# Patient Record
Sex: Male | Born: 1965 | Race: White | Hispanic: No | Marital: Married | Smoking: Never smoker
Health system: Southern US, Community
[De-identification: ages and names within clinical notes are randomized; demographics above are authoritative.]

## PROBLEM LIST (undated history)

## (undated) DIAGNOSIS — R519 Headache, unspecified: Secondary | ICD-10-CM

## (undated) DIAGNOSIS — E669 Obesity, unspecified: Secondary | ICD-10-CM

## (undated) DIAGNOSIS — E785 Hyperlipidemia, unspecified: Secondary | ICD-10-CM

## (undated) DIAGNOSIS — B029 Zoster without complications: Secondary | ICD-10-CM

## (undated) DIAGNOSIS — I10 Essential (primary) hypertension: Secondary | ICD-10-CM

## (undated) DIAGNOSIS — R51 Headache: Secondary | ICD-10-CM

## (undated) DIAGNOSIS — E1140 Type 2 diabetes mellitus with diabetic neuropathy, unspecified: Secondary | ICD-10-CM

## (undated) HISTORY — DX: Essential (primary) hypertension: I10

## (undated) HISTORY — DX: Hyperlipidemia, unspecified: E78.5

## (undated) HISTORY — PX: MIDDLE EAR SURGERY: SHX713

## (undated) HISTORY — DX: Type 2 diabetes mellitus with diabetic neuropathy, unspecified: E11.40

## (undated) HISTORY — DX: Obesity, unspecified: E66.9

---

## 2000-08-06 ENCOUNTER — Emergency Department (HOSPITAL_COMMUNITY): Admission: EM | Admit: 2000-08-06 | Discharge: 2000-08-06 | Payer: Self-pay | Admitting: Emergency Medicine

## 2000-08-06 ENCOUNTER — Encounter: Payer: Self-pay | Admitting: Emergency Medicine

## 2000-08-11 ENCOUNTER — Ambulatory Visit (HOSPITAL_COMMUNITY): Admission: RE | Admit: 2000-08-11 | Discharge: 2000-08-11 | Payer: Self-pay | Admitting: Pulmonary Disease

## 2000-09-07 ENCOUNTER — Encounter: Admission: RE | Admit: 2000-09-07 | Discharge: 2000-09-13 | Payer: Self-pay | Admitting: Pulmonary Disease

## 2007-02-05 ENCOUNTER — Ambulatory Visit (HOSPITAL_COMMUNITY): Admission: RE | Admit: 2007-02-05 | Discharge: 2007-02-05 | Payer: Self-pay | Admitting: Unknown Physician Specialty

## 2009-02-14 DIAGNOSIS — E119 Type 2 diabetes mellitus without complications: Secondary | ICD-10-CM

## 2009-02-14 HISTORY — DX: Type 2 diabetes mellitus without complications: E11.9

## 2010-03-11 ENCOUNTER — Encounter (INDEPENDENT_AMBULATORY_CARE_PROVIDER_SITE_OTHER): Payer: Self-pay | Admitting: *Deleted

## 2010-03-11 LAB — CONVERTED CEMR LAB
BUN: 9 mg/dL
CO2: 26 meq/L
Calcium: 9.2 mg/dL
Chloride: 102 meq/L
Creatinine, Ser: 0.79 mg/dL
Glucose, Bld: 111 mg/dL
Potassium: 4.1 meq/L
Sodium: 139 meq/L

## 2010-03-12 ENCOUNTER — Encounter (INDEPENDENT_AMBULATORY_CARE_PROVIDER_SITE_OTHER): Payer: Self-pay | Admitting: *Deleted

## 2010-03-12 LAB — CONVERTED CEMR LAB
ALT: 10 units/L
AST: 15 units/L
Albumin: 4.3 g/dL
Alkaline Phosphatase: 42 units/L
Bilirubin, Direct: 0.2 mg/dL
Cholesterol: 190 mg/dL
HDL: 40 mg/dL
Hgb A1c MFr Bld: 7.2 %
LDL Cholesterol: 124 mg/dL
Total Protein: 6.7 g/dL
Triglycerides: 132 mg/dL

## 2010-03-30 ENCOUNTER — Ambulatory Visit: Payer: Self-pay | Admitting: Cardiology

## 2010-04-09 ENCOUNTER — Encounter (INDEPENDENT_AMBULATORY_CARE_PROVIDER_SITE_OTHER): Payer: Self-pay | Admitting: *Deleted

## 2010-04-09 ENCOUNTER — Ambulatory Visit: Payer: Self-pay | Admitting: Cardiology

## 2010-04-09 ENCOUNTER — Encounter: Payer: Self-pay | Admitting: Cardiology

## 2010-04-09 DIAGNOSIS — E1165 Type 2 diabetes mellitus with hyperglycemia: Secondary | ICD-10-CM | POA: Insufficient documentation

## 2010-04-09 DIAGNOSIS — E669 Obesity, unspecified: Secondary | ICD-10-CM | POA: Insufficient documentation

## 2010-04-09 DIAGNOSIS — E119 Type 2 diabetes mellitus without complications: Secondary | ICD-10-CM | POA: Insufficient documentation

## 2010-04-13 NOTE — Miscellaneous (Signed)
Summary: labs bmp,lipids,a1c,03/12/2010  Clinical Lists Changes  Observations: Added new observation of ALBUMIN: 4.3 g/dL (16/11/9602 5:40) Added new observation of PROTEIN, TOT: 6.7 g/dL (98/12/9145 8:29) Added new observation of SGPT (ALT): 10 units/L (03/12/2010 8:36) Added new observation of SGOT (AST): 15 units/L (03/12/2010 8:36) Added new observation of ALK PHOS: 42 units/L (03/12/2010 8:36) Added new observation of BILI DIRECT: 0.2 mg/dL (56/21/3086 5:78) Added new observation of LDL: 124 mg/dL (46/96/2952 8:41) Added new observation of HDL: 40 mg/dL (32/44/0102 7:25) Added new observation of TRIGLYC TOT: 132 mg/dL (36/64/4034 7:42) Added new observation of CHOLESTEROL: 190 mg/dL (59/56/3875 6:43) Added new observation of HGBA1C: 7.2 % (03/12/2010 8:36) Added new observation of CALCIUM: 9.2 mg/dL (32/95/1884 1:66) Added new observation of CREATININE: 0.79 mg/dL (08/14/1599 0:93) Added new observation of BUN: 9 mg/dL (23/55/7322 0:25) Added new observation of BG RANDOM: 111 mg/dL (42/70/6237 6:28) Added new observation of CO2 PLSM/SER: 26 meq/L (03/11/2010 8:36) Added new observation of CL SERUM: 102 meq/L (03/11/2010 8:36) Added new observation of K SERUM: 4.1 meq/L (03/11/2010 8:36) Added new observation of NA: 139 meq/L (03/11/2010 8:36)

## 2010-04-13 NOTE — Letter (Signed)
Summary: DR Juanetta Gosling RECORDS  [facility]   Imported By: Faythe Ghee 04/09/2010 14:36:03  _____________________________________________________________________  External Attachment:    Type:   Image     Comment:   External Document

## 2010-04-15 ENCOUNTER — Encounter: Payer: Self-pay | Admitting: *Deleted

## 2010-05-04 ENCOUNTER — Ambulatory Visit (INDEPENDENT_AMBULATORY_CARE_PROVIDER_SITE_OTHER): Payer: BC Managed Care – PPO | Admitting: Cardiology

## 2010-05-04 ENCOUNTER — Encounter: Payer: Self-pay | Admitting: Cardiology

## 2010-05-04 VITALS — BP 149/89 | HR 96 | Wt 265.0 lb

## 2010-05-04 DIAGNOSIS — I10 Essential (primary) hypertension: Secondary | ICD-10-CM | POA: Insufficient documentation

## 2010-05-04 DIAGNOSIS — R079 Chest pain, unspecified: Secondary | ICD-10-CM

## 2010-05-04 DIAGNOSIS — E785 Hyperlipidemia, unspecified: Secondary | ICD-10-CM | POA: Insufficient documentation

## 2010-05-04 NOTE — Patient Instructions (Signed)
Follow up same day as stress test Stress Test, Cardiovascular A cardiovascular stress test is done to help determine the presence of coronary artery disease (CAD). A stress test is done while you are walking on a treadmill. The goal of an exercise stress test is to raise your heart rate. This will help determine whether your heart can supply itself with enough blood during increased exercise. People with CAD may experience symptoms such as:  Chest pain.   Shortness of breath.   Arm pain. This usually occurs in the left arm but can occur in the right arm.   Forceful or irregular heart beats (palpitations). This may occur with or without dizziness upon activity.   Unusual sweating at rest or profuse sweating with light activity.   Associated nausea with the above symptoms.  INSTRUCTIONS PRIOR TO YOUR STRESS TEST  You should not eat or drink for 4 hours prior to your stress test or as told by your caregiver.   This test involves walking on a treadmill. Wear loose fitting clothes and comfortable shoes for the test.   Arrive 1 hour before the test or as told by your caregiver.  PROCEDURE OF AN EXERCISE STRESS TEST  Many sticky patches (electrodes) will be put on your chest.   If you have a hairy chest, small areas may have to be shaved to make better contact with the electrodes.   Once the electrodes are attached to your body, wires will be attached to the electrodes, then to an electrocardiogram (ECG) machine.   The ECG machine will record your heart rhythm. Specific heart rhythm changes will be noted on the ECG printout.   While you are walking on the treadmill, your heart and blood pressure will be monitored.   The treadmill will be started at a slow pace. The treadmill speed and incline will gradually be increased to raise your heart rate.   The test can be expected to last between 1 and 2 hours. You will need to be monitored after the test to make sure your heart rate and blood  pressure are okay before you go home.  THE STRESS TEST MAY BE STOPPED IF:  You develop an abnormal heart rate or a very fast heart rate.   You develop chest pain.   You develop high or low blood pressure.   You develop shortness of breath.   You feel dizzy.  WHAT DOES AN ABNORMAL RESULT MEAN AND WHAT HAPPENS NEXT?   An abnormal stress test can indicate significant coronary artery disease (CAD). CAD is defined as narrowing in 1 or more heart (coronary) arteries of more than 70%.   If you have an abnormal stress test, this does not mean your are not getting blood flow to your heart. It only means more testing is needed to understand why your test was abnormal. A common follow up test is a coronary angiogram.  FINDING OUT THE RESULTS OF YOUR TEST  Not all test results are available during your visit. If your test results are not back during the visit, make an appointment with your caregiver to find out the results. Do not assume everything is normal if you have not heard from your caregiver or the medical facility. It is important for you to follow up on all of your test results.  SEEK IMMEDIATE MEDICAL CARE IF: At any time after the stress test you develop:   Chest pain.   Pain radiating down your left arm.   A feeling of  being sick to your stomach (nausea).   Vomiting.   Fainting or shortness of breath.  MAKE SURE YOU:   Understand these instructions.   Will watch your condition.   Will get help right away if you are not doing well or get worse.  Document Released: 01/29/2000 Document Re-Released: 11/28/2008 [facility] Patient Information 2011 [place], [place].

## 2010-05-04 NOTE — Progress Notes (Signed)
HPI:  [name] is seen at the kind request of Dr. Kari Baars for multiple cardiovascular risk factors with a family history of premature coronary disease.  Karl Atkins has enjoyed generally good health, but has had mild diabetes and hypertension that have been well-controlled over the past few years.  He also has treated hyperlipidemia, but specific laboratory values are not currently available.  Prior records have been requested with available prior records reviewed.  Patient denies all cardiopulmonary symptoms despite maintaining a high level of activity including riding a mountain bicycle.  He does considerable lifting in his work without difficulty.  He has never previously been seen by cardiologist or undergone any significant cardiac testing.  Current Outpatient Prescriptions on File Prior to Visit  Medication Sig Dispense Refill  . olmesartan (BENICAR) 20 MG tablet Take 20 mg by mouth daily.        TriCor 145 mg q.d. Glyburide-metformin 5/500 mg q.d. Rosuvastatin 40 mg q.d.  No Known Allergies    Past Medical History  Diagnosis Date  . Hypertension   . Diabetes mellitus   . Obesity   . Hyperlipidemia      Past Surgical History  Procedure Date  . Middle ear surgery     x2     Family history:  Father required CABG surgery at age 52; Mother is deceased with a history of CHF, chronic obstructive pulmonary disease and heart valve replacement surgery.  3 siblings are alive and well.   History   Social History  . Marital Status: Married    Spouse Name: N/A    Number of Children: N/A  . Years of Education: N/A   Occupational History  . Funeral Director    Social History Main Topics  . Smoking status: Never Smoker   . Smokeless tobacco: Not on file  . Alcohol Use: 0.0 oz/week  . Drug Use: Not on file  . Sexually Active: Not on file   Other Topics Concern  . Not on file   Social History Narrative  . No narrative on file     ROS:        Requires corrective  lenses; partial upper and lower dentures.  All other systems reviewed and are negative.  PHYSICAL EXAM: BP 149/89  Pulse 96  Wt 265 lb (120.203 kg)  SpO2 96%  General-Well developed; no acute distress Body habitus-Mildly obese Neck-No JVD; no carotid bruits Lungs-clear lung fields; resonant to percussion Cardiovascular-normal PMI; normal S1 and S2 Abdomen-normal bowel sounds; soft and non-tender without masses or organomegaly Musculoskeletal-No deformities, no cyanosis or clubbing Neurologic-Normal cranial nerves; symmetric strength and tone Skin-Warm, no significant lesions Extremities-distal pulses intact; no edema  EKG:   Normal sinus rhythm; left atrial abnormality; right ventricular conduction delay; otherwise normal; no previous tracing for comparison  ASSESSMENT AND PLAN:

## 2010-06-01 ENCOUNTER — Other Ambulatory Visit: Payer: Self-pay | Admitting: Cardiology

## 2010-06-03 ENCOUNTER — Encounter (HOSPITAL_COMMUNITY): Payer: BC Managed Care – PPO

## 2010-06-03 ENCOUNTER — Ambulatory Visit (INDEPENDENT_AMBULATORY_CARE_PROVIDER_SITE_OTHER): Payer: BC Managed Care – PPO | Admitting: *Deleted

## 2010-06-03 ENCOUNTER — Encounter (HOSPITAL_COMMUNITY): Payer: Self-pay

## 2010-06-03 ENCOUNTER — Encounter (HOSPITAL_COMMUNITY)
Admit: 2010-06-03 | Discharge: 2010-06-03 | Disposition: A | Discharge status: Home / Self Care | Payer: BC Managed Care – PPO | Attending: Cardiology | Admitting: Cardiology

## 2010-06-03 DIAGNOSIS — R079 Chest pain, unspecified: Secondary | ICD-10-CM

## 2010-06-03 MED ORDER — TECHNETIUM TC 99M TETROFOSMIN IV KIT
30.0000 | PACK | Freq: Once | INTRAVENOUS | Status: AC | PRN
  Administered 2010-06-03: 29 via INTRAVENOUS

## 2010-06-03 MED ORDER — TECHNETIUM TC 99M TETROFOSMIN IV KIT
10.0000 | PACK | Freq: Once | INTRAVENOUS | Status: AC | PRN
  Administered 2010-06-03: 9.3 via INTRAVENOUS

## 2010-06-03 NOTE — Progress Notes (Signed)
Please See Full Report Of Nuclear Stress Test.

## 2011-12-09 ENCOUNTER — Encounter (HOSPITAL_COMMUNITY): Payer: Self-pay | Admitting: *Deleted

## 2011-12-09 ENCOUNTER — Emergency Department (HOSPITAL_COMMUNITY)
Admission: EM | Admit: 2011-12-09 | Discharge: 2011-12-09 | Disposition: A | Discharge status: Home / Self Care | Payer: BC Managed Care – PPO | Source: Home / Self Care

## 2011-12-09 DIAGNOSIS — M62830 Muscle spasm of back: Secondary | ICD-10-CM

## 2011-12-09 DIAGNOSIS — S39012A Strain of muscle, fascia and tendon of lower back, initial encounter: Secondary | ICD-10-CM

## 2011-12-09 DIAGNOSIS — M5380 Other specified dorsopathies, site unspecified: Secondary | ICD-10-CM

## 2011-12-09 MED ORDER — HYDROCODONE-ACETAMINOPHEN 5-325 MG PO TABS: ORAL_TABLET | ORAL | Status: DC

## 2011-12-09 MED ORDER — KETOROLAC TROMETHAMINE 60 MG/2ML IM SOLN
INTRAMUSCULAR | Status: AC
  Filled 2011-12-09: qty 2

## 2011-12-09 MED ORDER — NAPROXEN 500 MG PO TABS: 500.0000 mg | ORAL_TABLET | Freq: Two times a day (BID) | ORAL | Status: DC

## 2011-12-09 MED ORDER — TRIAMCINOLONE ACETONIDE 40 MG/ML IJ SUSP
40.0000 mg | Freq: Once | INTRAMUSCULAR | Status: AC
  Administered 2011-12-09: 40 mg via INTRAMUSCULAR

## 2011-12-09 MED ORDER — TRIAMCINOLONE ACETONIDE 40 MG/ML IJ SUSP
INTRAMUSCULAR | Status: AC
  Filled 2011-12-09: qty 5

## 2011-12-09 MED ORDER — KETOROLAC TROMETHAMINE 60 MG/2ML IM SOLN
60.0000 mg | Freq: Once | INTRAMUSCULAR | Status: AC
  Administered 2011-12-09: 60 mg via INTRAMUSCULAR

## 2011-12-09 MED ORDER — CYCLOBENZAPRINE HCL 5 MG PO TABS: 5.0000 mg | ORAL_TABLET | Freq: Three times a day (TID) | ORAL | Status: DC | PRN

## 2011-12-09 NOTE — ED Notes (Signed)
Missed 1 step and did not fall on Wed. evening.  Landed on R foot then L foot. Pain in R posterior hip and radiates down R leg. Had 2 adjustments by the chiropractor today.  Could not sit at the table today.

## 2011-12-09 NOTE — ED Provider Notes (Signed)
History     CSN: [id number]  Arrival date & time 12/09/11  1938   None     Chief Complaint  Patient presents with  . Hip Pain    (Consider location/radiation/quality/duration/timing/severity/associated sxs/prior treatment) HPI Comments: 46 year old male was descending a stair this the first step and stepped down hard first with one leg in the other. He did not notice any particular injury at the time however the next day he started developing pain in his right low back and hip. Yesterday the pain was significantly worse in the right low back and buttock. He saw his chiropractor today who performed alignment and also attached to 10 units to his right low back. He still having significant pain it was suggested by the chiropractor that he get medications, especially steroids for his back pain. She denies neurologic deficits or problems with sensation or motor. The pain is worse with sitting.   Past Medical History  Diagnosis Date  . Hypertension   . Diabetes mellitus   . Obesity   . Hyperlipidemia     Past Surgical History  Procedure Date  . Middle ear surgery     x2    Family History  Problem Relation Age of Onset  . Diabetes Father   . Heart failure Father   . Diabetes Mother   . Heart failure Mother   . Hypertension Mother     History  Substance Use Topics  . Smoking status: Never Smoker   . Smokeless tobacco: Not on file  . Alcohol Use: 0.0 oz/week     rarely      Review of Systems  Constitutional: Negative.   Respiratory: Negative.   Gastrointestinal: Negative.   Genitourinary: Negative.   Musculoskeletal: Positive for myalgias and back pain. Negative for joint swelling.       As per HPI  Skin: Negative.   Neurological: Negative for dizziness, weakness, numbness and headaches.    Allergies  Codeine and Pollen extract-tree extract  Home Medications   Current Outpatient Rx  Name Route Sig Dispense Refill  . CYCLOBENZAPRINE HCL 5 MG PO TABS Oral  Take 1 tablet (5 mg total) by mouth 3 (three) times daily as needed for muscle spasms. 30 tablet 0  . FENOFIBRATE 145 MG PO TABS Oral Take 145 mg by mouth daily.      . GLYBURIDE-METFORMIN 5-500 MG PO TABS Oral Take 1 tablet by mouth daily with breakfast.      . HYDROCODONE-ACETAMINOPHEN 5-325 MG PO TABS  Take one or two tabs q 4 hours as needed for pain 20 tablet 0  . NAPROXEN 500 MG PO TABS Oral Take 1 tablet (500 mg total) by mouth 2 (two) times daily. 20 tablet 0  . OLMESARTAN MEDOXOMIL 20 MG PO TABS Oral Take 20 mg by mouth daily.      Marland Kitchen ROSUVASTATIN CALCIUM 40 MG PO TABS Oral Take 40 mg by mouth daily.        BP 147/90  Pulse 88  Temp 98.7 F (37.1 C) (Oral)  Resp 16  SpO2 99%  Physical Exam  Constitutional: He is oriented to person, place, and time. He appears well-developed and well-nourished.  HENT:  Head: Normocephalic and atraumatic.  Eyes: EOM are normal. Left eye exhibits no discharge.  Neck: Normal range of motion. Neck supple.  Pulmonary/Chest: Effort normal.  Musculoskeletal: He exhibits tenderness. He exhibits no edema.       Tenderness in the musculature of the right low back in the paralumbar  musculature. A "ball" of muscle spasm is palpable in this area. All the pain radiates into the buttock there is no tenderness there. He is also experiencing pain in the posterior thigh and calf. There is minor tenderness in these areas. Straight leg raise does not produce pain in the back discomfort in the calf and hamstring muscles. Distal neurovascular motor sensory is grossly intact.  Neurological: He is alert and oriented to person, place, and time. No cranial nerve deficit.  Skin: Skin is warm and dry. No rash noted. No erythema.  Psychiatric: He has a normal mood and affect.    ED Course  Procedures (including critical care time)  Labs Reviewed - No data to display No results found.   1. Lumbosacral strain   2. Muscle spasm of back       MDM  Kenalog 40 mg IM  now Toradol 60 mg IM now Rx for Norco 5 mg 1-2 tabs every 4 hours when necessary pain Naprosyn 500 mg twice a day with food when necessary pain Flexeril 5 mg one 3 times a day when necessary muscle spasm Apply heat to areas of muscle pain and spasm        Hayden Rasmussen, NP 12/09/11 2101

## 2011-12-10 NOTE — ED Provider Notes (Signed)
Medical screening examination/treatment/procedure(s) were performed by non-physician practitioner and as supervising physician I was immediately available for consultation/collaboration.  Raynald Blend, MD 12/10/11 [phone]

## 2011-12-12 ENCOUNTER — Other Ambulatory Visit (HOSPITAL_COMMUNITY): Payer: Self-pay | Admitting: Preventative Medicine

## 2011-12-12 DIAGNOSIS — T1490XA Injury, unspecified, initial encounter: Secondary | ICD-10-CM

## 2011-12-13 ENCOUNTER — Ambulatory Visit (HOSPITAL_COMMUNITY)
Admission: RE | Admit: 2011-12-13 | Discharge: 2011-12-13 | Disposition: A | Discharge status: Home / Self Care | Payer: BC Managed Care – PPO | Source: Ambulatory Visit | Attending: Preventative Medicine | Admitting: Preventative Medicine

## 2011-12-13 DIAGNOSIS — S39840A Fracture of corpus cavernosum penis, initial encounter: Secondary | ICD-10-CM | POA: Insufficient documentation

## 2011-12-13 DIAGNOSIS — M5126 Other intervertebral disc displacement, lumbar region: Secondary | ICD-10-CM | POA: Insufficient documentation

## 2011-12-13 DIAGNOSIS — M545 Low back pain, unspecified: Secondary | ICD-10-CM | POA: Insufficient documentation

## 2011-12-13 DIAGNOSIS — M51379 Other intervertebral disc degeneration, lumbosacral region without mention of lumbar back pain or lower extremity pain: Secondary | ICD-10-CM | POA: Insufficient documentation

## 2011-12-13 DIAGNOSIS — W108XXA Fall (on) (from) other stairs and steps, initial encounter: Secondary | ICD-10-CM | POA: Insufficient documentation

## 2011-12-13 DIAGNOSIS — M5137 Other intervertebral disc degeneration, lumbosacral region: Secondary | ICD-10-CM | POA: Insufficient documentation

## 2011-12-13 DIAGNOSIS — T1490XA Injury, unspecified, initial encounter: Secondary | ICD-10-CM

## 2012-02-15 HISTORY — PX: BACK SURGERY: SHX140

## 2012-03-01 ENCOUNTER — Other Ambulatory Visit (HOSPITAL_COMMUNITY): Payer: Self-pay | Admitting: Neurosurgery

## 2012-03-01 DIAGNOSIS — M5126 Other intervertebral disc displacement, lumbar region: Secondary | ICD-10-CM

## 2012-03-05 ENCOUNTER — Ambulatory Visit
Admission: RE | Admit: 2012-03-05 | Discharge: 2012-03-05 | Disposition: A | Discharge status: Home / Self Care | Payer: BC Managed Care – PPO | Source: Ambulatory Visit | Attending: Neurosurgery | Admitting: Neurosurgery

## 2012-03-05 DIAGNOSIS — M5126 Other intervertebral disc displacement, lumbar region: Secondary | ICD-10-CM

## 2012-03-06 ENCOUNTER — Other Ambulatory Visit (HOSPITAL_COMMUNITY): Payer: BC Managed Care – PPO

## 2015-08-06 DIAGNOSIS — T148 Other injury of unspecified body region: Secondary | ICD-10-CM | POA: Diagnosis not present

## 2015-11-17 DIAGNOSIS — S8410XA Injury of peroneal nerve at lower leg level, unspecified leg, initial encounter: Secondary | ICD-10-CM | POA: Diagnosis not present

## 2015-11-17 DIAGNOSIS — M5126 Other intervertebral disc displacement, lumbar region: Secondary | ICD-10-CM | POA: Diagnosis not present

## 2015-12-30 DIAGNOSIS — R109 Unspecified abdominal pain: Secondary | ICD-10-CM | POA: Diagnosis not present

## 2015-12-30 DIAGNOSIS — E785 Hyperlipidemia, unspecified: Secondary | ICD-10-CM | POA: Diagnosis not present

## 2015-12-30 DIAGNOSIS — I10 Essential (primary) hypertension: Secondary | ICD-10-CM | POA: Diagnosis not present

## 2015-12-30 DIAGNOSIS — E119 Type 2 diabetes mellitus without complications: Secondary | ICD-10-CM | POA: Diagnosis not present

## 2015-12-31 DIAGNOSIS — E785 Hyperlipidemia, unspecified: Secondary | ICD-10-CM | POA: Diagnosis not present

## 2015-12-31 DIAGNOSIS — E119 Type 2 diabetes mellitus without complications: Secondary | ICD-10-CM | POA: Diagnosis not present

## 2015-12-31 DIAGNOSIS — M21371 Foot drop, right foot: Secondary | ICD-10-CM | POA: Diagnosis not present

## 2015-12-31 DIAGNOSIS — I10 Essential (primary) hypertension: Secondary | ICD-10-CM | POA: Diagnosis not present

## 2016-01-01 ENCOUNTER — Other Ambulatory Visit (HOSPITAL_COMMUNITY): Payer: Self-pay | Admitting: Pulmonary Disease

## 2016-01-01 DIAGNOSIS — R109 Unspecified abdominal pain: Secondary | ICD-10-CM

## 2016-01-04 DIAGNOSIS — S8410XA Injury of peroneal nerve at lower leg level, unspecified leg, initial encounter: Secondary | ICD-10-CM | POA: Diagnosis not present

## 2016-01-04 DIAGNOSIS — M5126 Other intervertebral disc displacement, lumbar region: Secondary | ICD-10-CM | POA: Diagnosis not present

## 2016-01-08 ENCOUNTER — Ambulatory Visit (HOSPITAL_COMMUNITY): Admission: RE | Admit: 2016-01-08 | Payer: BLUE CROSS/BLUE SHIELD | Source: Ambulatory Visit

## 2016-01-08 ENCOUNTER — Encounter (HOSPITAL_COMMUNITY): Payer: Self-pay

## 2016-01-08 ENCOUNTER — Ambulatory Visit (HOSPITAL_COMMUNITY): Payer: Self-pay

## 2016-01-13 ENCOUNTER — Ambulatory Visit (HOSPITAL_COMMUNITY)
Admission: RE | Admit: 2016-01-13 | Discharge: 2016-01-13 | Disposition: A | Discharge status: Home / Self Care | Payer: BLUE CROSS/BLUE SHIELD | Source: Ambulatory Visit | Attending: Pulmonary Disease | Admitting: Pulmonary Disease

## 2016-01-13 ENCOUNTER — Encounter (HOSPITAL_COMMUNITY): Payer: Self-pay

## 2016-01-13 DIAGNOSIS — R634 Abnormal weight loss: Secondary | ICD-10-CM | POA: Diagnosis not present

## 2016-01-13 DIAGNOSIS — I700 Atherosclerosis of aorta: Secondary | ICD-10-CM | POA: Insufficient documentation

## 2016-01-13 DIAGNOSIS — R1032 Left lower quadrant pain: Secondary | ICD-10-CM | POA: Insufficient documentation

## 2016-01-13 DIAGNOSIS — K5790 Diverticulosis of intestine, part unspecified, without perforation or abscess without bleeding: Secondary | ICD-10-CM | POA: Diagnosis not present

## 2016-01-13 DIAGNOSIS — R109 Unspecified abdominal pain: Secondary | ICD-10-CM

## 2016-01-13 DIAGNOSIS — K5730 Diverticulosis of large intestine without perforation or abscess without bleeding: Secondary | ICD-10-CM | POA: Diagnosis not present

## 2016-01-13 LAB — POCT I-STAT CREATININE: CREATININE: 1 mg/dL (ref 0.61–1.24)

## 2016-01-13 MED ORDER — IOPAMIDOL (ISOVUE-300) INJECTION 61%
100.0000 mL | Freq: Once | INTRAVENOUS | Status: AC | PRN
  Administered 2016-01-13: 100 mL via INTRAVENOUS

## 2016-01-15 ENCOUNTER — Other Ambulatory Visit: Payer: Self-pay | Admitting: Neurosurgery

## 2016-01-15 DIAGNOSIS — M5126 Other intervertebral disc displacement, lumbar region: Secondary | ICD-10-CM

## 2016-01-18 ENCOUNTER — Ambulatory Visit (HOSPITAL_COMMUNITY): Payer: BLUE CROSS/BLUE SHIELD

## 2016-01-29 ENCOUNTER — Other Ambulatory Visit: Payer: Self-pay | Admitting: Neurosurgery

## 2016-01-29 DIAGNOSIS — M542 Cervicalgia: Secondary | ICD-10-CM

## 2016-01-31 ENCOUNTER — Ambulatory Visit
Admission: RE | Admit: 2016-01-31 | Discharge: 2016-01-31 | Disposition: A | Discharge status: Home / Self Care | Payer: BLUE CROSS/BLUE SHIELD | Source: Ambulatory Visit | Attending: Neurosurgery | Admitting: Neurosurgery

## 2016-01-31 ENCOUNTER — Inpatient Hospital Stay: Admission: RE | Admit: 2016-01-31 | Payer: BLUE CROSS/BLUE SHIELD | Source: Ambulatory Visit

## 2016-01-31 ENCOUNTER — Other Ambulatory Visit: Payer: BLUE CROSS/BLUE SHIELD

## 2016-01-31 DIAGNOSIS — M4802 Spinal stenosis, cervical region: Secondary | ICD-10-CM | POA: Diagnosis not present

## 2016-01-31 DIAGNOSIS — M542 Cervicalgia: Secondary | ICD-10-CM

## 2016-01-31 DIAGNOSIS — M5126 Other intervertebral disc displacement, lumbar region: Secondary | ICD-10-CM

## 2016-02-04 DIAGNOSIS — I10 Essential (primary) hypertension: Secondary | ICD-10-CM | POA: Diagnosis not present

## 2016-02-04 DIAGNOSIS — Z0000 Encounter for general adult medical examination without abnormal findings: Secondary | ICD-10-CM | POA: Diagnosis not present

## 2016-02-04 DIAGNOSIS — E785 Hyperlipidemia, unspecified: Secondary | ICD-10-CM | POA: Diagnosis not present

## 2016-02-04 DIAGNOSIS — E119 Type 2 diabetes mellitus without complications: Secondary | ICD-10-CM | POA: Diagnosis not present

## 2016-02-23 DIAGNOSIS — S8410XA Injury of peroneal nerve at lower leg level, unspecified leg, initial encounter: Secondary | ICD-10-CM | POA: Diagnosis not present

## 2016-02-23 DIAGNOSIS — M5126 Other intervertebral disc displacement, lumbar region: Secondary | ICD-10-CM | POA: Diagnosis not present

## 2016-03-16 DIAGNOSIS — N529 Male erectile dysfunction, unspecified: Secondary | ICD-10-CM | POA: Diagnosis not present

## 2016-03-16 DIAGNOSIS — E291 Testicular hypofunction: Secondary | ICD-10-CM | POA: Diagnosis not present

## 2016-03-17 ENCOUNTER — Other Ambulatory Visit: Payer: Self-pay | Admitting: Gastroenterology

## 2016-04-18 ENCOUNTER — Encounter (HOSPITAL_COMMUNITY): Payer: Self-pay | Admitting: *Deleted

## 2016-04-19 ENCOUNTER — Ambulatory Visit (HOSPITAL_COMMUNITY): Payer: BLUE CROSS/BLUE SHIELD | Admitting: Certified Registered"

## 2016-04-19 ENCOUNTER — Encounter (HOSPITAL_COMMUNITY): Payer: Self-pay | Admitting: Certified Registered"

## 2016-04-19 ENCOUNTER — Encounter (HOSPITAL_COMMUNITY)
Admission: RE | Disposition: A | Discharge status: Home / Self Care | Payer: Self-pay | Source: Ambulatory Visit | Attending: Gastroenterology

## 2016-04-19 ENCOUNTER — Ambulatory Visit (HOSPITAL_COMMUNITY)
Admission: RE | Admit: 2016-04-19 | Discharge: 2016-04-19 | Disposition: A | Discharge status: Home / Self Care | Payer: BLUE CROSS/BLUE SHIELD | Source: Ambulatory Visit | Attending: Gastroenterology | Admitting: Gastroenterology

## 2016-04-19 DIAGNOSIS — D122 Benign neoplasm of ascending colon: Secondary | ICD-10-CM | POA: Diagnosis not present

## 2016-04-19 DIAGNOSIS — Z7984 Long term (current) use of oral hypoglycemic drugs: Secondary | ICD-10-CM | POA: Diagnosis not present

## 2016-04-19 DIAGNOSIS — E785 Hyperlipidemia, unspecified: Secondary | ICD-10-CM | POA: Insufficient documentation

## 2016-04-19 DIAGNOSIS — E119 Type 2 diabetes mellitus without complications: Secondary | ICD-10-CM | POA: Insufficient documentation

## 2016-04-19 DIAGNOSIS — Z1211 Encounter for screening for malignant neoplasm of colon: Secondary | ICD-10-CM | POA: Diagnosis not present

## 2016-04-19 DIAGNOSIS — I10 Essential (primary) hypertension: Secondary | ICD-10-CM | POA: Diagnosis not present

## 2016-04-19 DIAGNOSIS — D124 Benign neoplasm of descending colon: Secondary | ICD-10-CM | POA: Insufficient documentation

## 2016-04-19 DIAGNOSIS — E7800 Pure hypercholesterolemia, unspecified: Secondary | ICD-10-CM | POA: Insufficient documentation

## 2016-04-19 HISTORY — PX: COLONOSCOPY WITH PROPOFOL: SHX5780

## 2016-04-19 HISTORY — DX: Headache: R51

## 2016-04-19 HISTORY — DX: Headache, unspecified: R51.9

## 2016-04-19 LAB — GLUCOSE, CAPILLARY: Glucose-Capillary: 167 mg/dL — ABNORMAL HIGH (ref 65–99)

## 2016-04-19 SURGERY — COLONOSCOPY WITH PROPOFOL
Anesthesia: Monitor Anesthesia Care

## 2016-04-19 MED ORDER — LIDOCAINE 2% (20 MG/ML) 5 ML SYRINGE
INTRAMUSCULAR | Status: DC | PRN
Start: 2016-04-19 — End: 2016-04-19

## 2016-04-19 MED ORDER — LIDOCAINE 2% (20 MG/ML) 5 ML SYRINGE
INTRAMUSCULAR | Status: DC | PRN
  Administered 2016-04-19: 40 mg via INTRAVENOUS

## 2016-04-19 MED ORDER — SODIUM CHLORIDE 0.9 % IV SOLN: INTRAVENOUS | Status: DC

## 2016-04-19 MED ORDER — PROPOFOL 10 MG/ML IV BOLUS
INTRAVENOUS | Status: AC
  Filled 2016-04-19: qty 40

## 2016-04-19 MED ORDER — PROPOFOL 10 MG/ML IV BOLUS
INTRAVENOUS | Status: AC
  Filled 2016-04-19: qty 20

## 2016-04-19 MED ORDER — PROPOFOL 10 MG/ML IV BOLUS
INTRAVENOUS | Status: DC | PRN
  Administered 2016-04-19 (×8): 40 mg via INTRAVENOUS

## 2016-04-19 MED ORDER — LACTATED RINGERS IV SOLN
INTRAVENOUS | Status: DC
  Administered 2016-04-19: 1000 mL via INTRAVENOUS
  Administered 2016-04-19: 11:00:00 via INTRAVENOUS

## 2016-04-19 MED ORDER — ONDANSETRON HCL 4 MG/2ML IJ SOLN
INTRAMUSCULAR | Status: DC | PRN
  Administered 2016-04-19: 4 mg via INTRAVENOUS

## 2016-04-19 SURGICAL SUPPLY — 21 items

## 2016-04-19 NOTE — Discharge Instructions (Signed)

## 2016-04-19 NOTE — Anesthesia Postprocedure Evaluation (Signed)
Anesthesia Post Note  Patient: Karl Atkins  Procedure(s) Performed: Procedure(s) (LRB): COLONOSCOPY WITH PROPOFOL (N/A)  Patient location during evaluation: PACU Anesthesia Type: MAC Level of consciousness: awake and alert and oriented Pain management: pain level controlled Vital Signs Assessment: post-procedure vital signs reviewed and stable Respiratory status: spontaneous breathing, nonlabored ventilation and respiratory function stable Cardiovascular status: stable and blood pressure returned to baseline Postop Assessment: no signs of nausea or vomiting Anesthetic complications: no       Last Vitals:  Vitals:   04/19/16 1045 04/19/16 1144  BP: (!) 175/105 131/63  Pulse:  83  Resp: 12 17  Temp: 36.7 C 36.4 C    Last Pain:  Vitals:   04/19/16 1144  TempSrc: Oral                 Gazelle Towe A.

## 2016-04-19 NOTE — H&P (Signed)
Procedure: Baseline screening colonoscopy  History: The patient is a 51 year old male born October 21, 1965. He is scheduled to undergo his first screening colonoscopy with polypectomy to prevent colon cancer.  Past medical history: Back surgery. Hypertension. Hypercholesterolemia. Type 2 diabetes mellitus.  Medication allergies: None  Exam: The patient is alert and lying comfortably on the endoscopy stretcher. Abdomen is soft and nontender to palpation. Lungs are clear to auscultation. Cardiac exam reveals a regular rhythm.  Plan: Proceed with screening colonoscopy

## 2016-04-19 NOTE — Transfer of Care (Signed)
Immediate Anesthesia Transfer of Care Note  Patient: Karl Atkins  Procedure(s) Performed: Procedure(s): COLONOSCOPY WITH PROPOFOL (N/A)  Patient Location: PACU and Endoscopy Unit  Anesthesia Type:MAC  Level of Consciousness: sedated  Airway & Oxygen Therapy: Patient Spontanous Breathing and Patient connected to face mask oxygen  Post-op Assessment: Report given to RN and Post -op Vital signs reviewed and stable  Post vital signs: Reviewed  Last Vitals:  Vitals:   04/19/16 1045 04/19/16 1144  BP: (!) 175/105 131/63  Pulse:  83  Resp: 12 17  Temp: 36.7 C 36.4 C    Last Pain:  Vitals:   04/19/16 1144  TempSrc: Oral         Complications: No apparent anesthesia complications

## 2016-04-19 NOTE — Anesthesia Preprocedure Evaluation (Addendum)
Anesthesia Evaluation  Patient identified by MRN, date of birth, ID band Patient awake    Reviewed: Allergy & Precautions, NPO status , Patient's Chart, lab work & pertinent test results  Airway Mallampati: II  TM Distance: >3 FB Neck ROM: Full    Dental  (+) Edentulous Upper, Edentulous Lower, Dental Advisory Given   Pulmonary neg pulmonary ROS,    Pulmonary exam normal breath sounds clear to auscultation       Cardiovascular hypertension, Pt. on medications Normal cardiovascular exam Rhythm:Regular Rate:Normal     Neuro/Psych  Headaches, negative psych ROS   GI/Hepatic Neg liver ROS, Baseline screening colonoscopy   Endo/Other  diabetes, Well Controlled, Type 2, Oral Hypoglycemic AgentsObesity Hyperlipidemia  Renal/GU negative Renal ROS  negative genitourinary   Musculoskeletal negative musculoskeletal ROS (+)   Abdominal (+) + obese,   Peds  Hematology negative hematology ROS (+)   Anesthesia Other Findings   Reproductive/Obstetrics                            Anesthesia Physical Anesthesia Plan  ASA: II  Anesthesia Plan: MAC   Post-op Pain Management:    Induction:   Airway Management Planned: Natural Airway, Nasal Cannula and Simple Face Mask  Additional Equipment:   Intra-op Plan:   Post-operative Plan:   Informed Consent: I have reviewed the patients History and Physical, chart, labs and discussed the procedure including the risks, benefits and alternatives for the proposed anesthesia with the patient or authorized representative who has indicated his/her understanding and acceptance.     Plan Discussed with: Anesthesiologist, CRNA and Surgeon  Anesthesia Plan Comments:         Anesthesia Quick Evaluation

## 2016-04-19 NOTE — Op Note (Signed)
[facility] Patient Name: Karl Atkins Procedure Date: 04/19/2016 MRN: [id number] Attending MD: Garlan Fair , MD Date of Birth: 02-May-1965 CSN: [id number] Age: 51 Admit Type: Outpatient Procedure:                Colonoscopy Indications:              Screening for colorectal malignant neoplasm Providers:                Garlan Fair, MD, Hilma Favors, RN, Alfonso Patten, Technician, Glenis Smoker, CRNA Referring MD:              Medicines:                Propofol per Anesthesia Complications:            No immediate complications. Estimated Blood Loss:     Estimated blood loss was minimal. Procedure:                Pre-Anesthesia Assessment:                           - Prior to the procedure, a History and Physical                            was performed, and patient medications and                            allergies were reviewed. The patient's tolerance of                            previous anesthesia was also reviewed. The risks                            and benefits of the procedure and the sedation                            options and risks were discussed with the patient.                            All questions were answered, and informed consent                            was obtained. Prior Anticoagulants: The patient has                            taken no previous anticoagulant or antiplatelet                            agents. ASA Grade Assessment: II - A patient with                            mild systemic disease. After reviewing the risks  and benefits, the patient was deemed in                            satisfactory condition to undergo the procedure.                           After obtaining informed consent, the colonoscope                            was passed under direct vision. Throughout the                            procedure, the patient's blood pressure, pulse, and                  oxygen saturations were monitored continuously. The                            [id number] scope was introduced through                            the anus and advanced to the the cecum, identified                            by appendiceal orifice and ileocecal valve. The                            colonoscopy was performed without difficulty. The                            patient tolerated the procedure well. The quality                            of the bowel preparation was good. The appendiceal                            orifice and the rectum were photographed. Scope In: 11:14:46 AM Scope Out: 11:38:41 AM Scope Withdrawal Time: 0 hours 16 minutes 30 seconds  Total Procedure Duration: 0 hours 23 minutes 55 seconds  Findings:      The perianal and digital rectal examinations were normal.      A 6 mm polyp was found in the proximal descending colon. The polyp was       sessile. The polyp was removed with a cold snare. Resection and       retrieval were complete.      A 6 mm polyp was found in the mid ascending colon. The polyp was       sessile. The polyp was removed with a cold snare. Resection and       retrieval were complete.      The exam was otherwise without abnormality. Impression:               - One 6 mm polyp in the proximal descending colon,                            removed with a cold  snare. Resected and retrieved.                           - One 6 mm polyp in the mid ascending colon,                            removed with a cold snare. Resected and retrieved.                           - The examination was otherwise normal. Moderate Sedation:      N/A- Per Anesthesia Care Recommendation:           - Patient has a contact number available for                            emergencies. The signs and symptoms of potential                            delayed complications were discussed with the                            patient. Return to normal  activities tomorrow.                            Written discharge instructions were provided to the                            patient.                           - Repeat colonoscopy date to be determined after                            pending pathology results are reviewed for                            surveillance.                           - Resume previous diet.                           - Continue present medications. Procedure Code(s):        --- Professional ---                           [phone], Colonoscopy, flexible; with removal of                            tumor(s), polyp(s), or other lesion(s) by snare                            technique Diagnosis Code(s):        --- Professional ---                           Z12.11,  Encounter for screening for malignant                            neoplasm of colon                           D12.4, Benign neoplasm of descending colon                           D12.2, Benign neoplasm of ascending colon CPT copyright 2016 [employer]. All rights reserved. The codes documented in this report are preliminary and upon coder review may  be revised to meet current compliance requirements. Earle Gell, MD Garlan Fair, MD 04/19/2016 11:44:43 AM This report has been signed electronically. Number of Addenda: 0

## 2016-04-20 ENCOUNTER — Encounter (HOSPITAL_COMMUNITY): Payer: Self-pay | Admitting: Gastroenterology

## 2016-05-16 ENCOUNTER — Encounter: Payer: Self-pay | Admitting: Neurology

## 2016-05-16 ENCOUNTER — Ambulatory Visit (INDEPENDENT_AMBULATORY_CARE_PROVIDER_SITE_OTHER): Payer: BLUE CROSS/BLUE SHIELD | Admitting: Neurology

## 2016-05-16 ENCOUNTER — Ambulatory Visit (INDEPENDENT_AMBULATORY_CARE_PROVIDER_SITE_OTHER): Payer: Self-pay | Admitting: Neurology

## 2016-05-16 DIAGNOSIS — E1140 Type 2 diabetes mellitus with diabetic neuropathy, unspecified: Secondary | ICD-10-CM | POA: Insufficient documentation

## 2016-05-16 DIAGNOSIS — E1142 Type 2 diabetes mellitus with diabetic polyneuropathy: Secondary | ICD-10-CM

## 2016-05-16 HISTORY — DX: Type 2 diabetes mellitus with diabetic neuropathy, unspecified: E11.40

## 2016-05-16 NOTE — Procedures (Signed)
     HISTORY:  Karl Atkins is a 51 year old gentleman with a history of diabetes who has had prior lumbosacral spine surgery. The patient has developed numbness and weakness of the left leg with a footdrop that began around Thanksgiving of 2017. The patient is being evaluated for the left leg weakness.  NERVE CONDUCTION STUDIES:  Nerve conduction studies were performed on both lower extremities. The distal motor latencies for the peroneal nerves were absent on the left, prolonged on the right, and prolonged for the posterior tibial nerves bilaterally. The motor amplitude for the right peroneal nerve was low, and was slightly low for the right posterior tibial nerve, normal for the left posterior tibial nerve. The nerve conduction velocities for the right posterior tibial nerve was slightly slowed, normal for the left posterior tibial nerve and for the right peroneal nerve above the knee, slowed below the knee. The sural and peroneal sensory latencies were unobtainable bilaterally. The H reflex latencies were unobtainable bilaterally.  EMG STUDIES:  EMG study was performed on the left lower extremity:  The tibialis anterior muscle reveals 2 to 3K motor units with decreased recruitment. 2+ fibrillations and positive waves were seen. The peroneus tertius muscle reveals 2 to 3K motor units with decreased recruitment. 2+ fibrillations and positive waves were seen. The medial gastrocnemius muscle reveals 1 to 3K motor units with full recruitment. No fibrillations or positive waves were seen. The vastus lateralis muscle reveals 2 to 4K motor units with full recruitment. No fibrillations or positive waves were seen. The iliopsoas muscle reveals 2 to 4K motor units with full recruitment. No fibrillations or positive waves were seen. The biceps femoris muscle (long head) reveals 2 to 4K motor units with full recruitment. No fibrillations or positive waves were seen. The lumbosacral paraspinal muscles were  tested at 3 levels, and revealed no abnormalities of insertional activity at all 3 levels tested. There was good relaxation.   IMPRESSION:  Nerve conduction studies done on both lower extremities shows evidence of a primarily axonal peripheral neuropathy of moderate severity. 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 There is no evidence of an overlying lumbosacral radiculopathy.  Karl Alexanders MD 05/16/2016 4:25 PM  [facility] [address] [place], [place] [address]  Phone [phone] Fax [phone]

## 2016-05-16 NOTE — Progress Notes (Signed)
Please refer to EMG and nerve conduction procedure note.  

## 2016-05-17 DIAGNOSIS — M5126 Other intervertebral disc displacement, lumbar region: Secondary | ICD-10-CM | POA: Diagnosis not present

## 2016-05-17 DIAGNOSIS — S8410XA Injury of peroneal nerve at lower leg level, unspecified leg, initial encounter: Secondary | ICD-10-CM | POA: Diagnosis not present

## 2016-07-04 DIAGNOSIS — L738 Other specified follicular disorders: Secondary | ICD-10-CM | POA: Diagnosis not present

## 2016-07-04 DIAGNOSIS — D225 Melanocytic nevi of trunk: Secondary | ICD-10-CM | POA: Diagnosis not present

## 2016-07-04 DIAGNOSIS — L720 Epidermal cyst: Secondary | ICD-10-CM | POA: Diagnosis not present

## 2016-07-04 DIAGNOSIS — X32XXXA Exposure to sunlight, initial encounter: Secondary | ICD-10-CM | POA: Diagnosis not present

## 2016-07-04 DIAGNOSIS — L739 Follicular disorder, unspecified: Secondary | ICD-10-CM | POA: Diagnosis not present

## 2016-07-04 DIAGNOSIS — L570 Actinic keratosis: Secondary | ICD-10-CM | POA: Diagnosis not present

## 2016-07-04 DIAGNOSIS — C44319 Basal cell carcinoma of skin of other parts of face: Secondary | ICD-10-CM | POA: Diagnosis not present

## 2016-07-21 DIAGNOSIS — M25852 Other specified joint disorders, left hip: Secondary | ICD-10-CM | POA: Diagnosis not present

## 2016-07-21 DIAGNOSIS — S8410XA Injury of peroneal nerve at lower leg level, unspecified leg, initial encounter: Secondary | ICD-10-CM | POA: Diagnosis not present

## 2016-07-22 ENCOUNTER — Other Ambulatory Visit (HOSPITAL_COMMUNITY): Payer: Self-pay | Admitting: Neurosurgery

## 2016-07-22 ENCOUNTER — Ambulatory Visit (HOSPITAL_COMMUNITY)
Admission: RE | Admit: 2016-07-22 | Discharge: 2016-07-22 | Disposition: A | Discharge status: Home / Self Care | Payer: BLUE CROSS/BLUE SHIELD | Source: Ambulatory Visit | Attending: Neurosurgery | Admitting: Neurosurgery

## 2016-07-22 DIAGNOSIS — M25552 Pain in left hip: Secondary | ICD-10-CM | POA: Insufficient documentation

## 2016-07-22 DIAGNOSIS — M25852 Other specified joint disorders, left hip: Secondary | ICD-10-CM

## 2016-08-11 ENCOUNTER — Other Ambulatory Visit: Payer: Self-pay | Admitting: Neurosurgery

## 2016-08-11 ENCOUNTER — Other Ambulatory Visit: Payer: Self-pay | Admitting: Nurse Practitioner

## 2016-08-11 DIAGNOSIS — M25852 Other specified joint disorders, left hip: Secondary | ICD-10-CM

## 2016-08-19 ENCOUNTER — Ambulatory Visit
Admission: RE | Admit: 2016-08-19 | Discharge: 2016-08-19 | Disposition: A | Discharge status: Home / Self Care | Payer: BLUE CROSS/BLUE SHIELD | Source: Ambulatory Visit | Attending: Nurse Practitioner | Admitting: Nurse Practitioner

## 2016-08-19 DIAGNOSIS — M1612 Unilateral primary osteoarthritis, left hip: Secondary | ICD-10-CM | POA: Diagnosis not present

## 2016-08-19 DIAGNOSIS — M25852 Other specified joint disorders, left hip: Secondary | ICD-10-CM

## 2016-11-15 DIAGNOSIS — Z6831 Body mass index (BMI) 31.0-31.9, adult: Secondary | ICD-10-CM | POA: Diagnosis not present

## 2016-11-15 DIAGNOSIS — M4716 Other spondylosis with myelopathy, lumbar region: Secondary | ICD-10-CM | POA: Diagnosis not present

## 2016-11-16 DIAGNOSIS — H6692 Otitis media, unspecified, left ear: Secondary | ICD-10-CM | POA: Diagnosis not present

## 2016-11-16 DIAGNOSIS — H6522 Chronic serous otitis media, left ear: Secondary | ICD-10-CM | POA: Diagnosis not present

## 2016-11-16 DIAGNOSIS — H6122 Impacted cerumen, left ear: Secondary | ICD-10-CM | POA: Diagnosis not present

## 2016-11-16 DIAGNOSIS — H6062 Unspecified chronic otitis externa, left ear: Secondary | ICD-10-CM | POA: Diagnosis not present

## 2016-11-22 ENCOUNTER — Encounter (HOSPITAL_COMMUNITY): Payer: Self-pay | Admitting: *Deleted

## 2016-11-22 ENCOUNTER — Emergency Department (HOSPITAL_COMMUNITY)
Admission: EM | Admit: 2016-11-22 | Discharge: 2016-11-22 | Disposition: A | Discharge status: Home / Self Care | Payer: BLUE CROSS/BLUE SHIELD | Attending: Emergency Medicine | Admitting: Emergency Medicine

## 2016-11-22 ENCOUNTER — Emergency Department (HOSPITAL_COMMUNITY): Payer: BLUE CROSS/BLUE SHIELD

## 2016-11-22 DIAGNOSIS — Z79899 Other long term (current) drug therapy: Secondary | ICD-10-CM | POA: Insufficient documentation

## 2016-11-22 DIAGNOSIS — R1031 Right lower quadrant pain: Secondary | ICD-10-CM | POA: Diagnosis not present

## 2016-11-22 DIAGNOSIS — E1140 Type 2 diabetes mellitus with diabetic neuropathy, unspecified: Secondary | ICD-10-CM | POA: Insufficient documentation

## 2016-11-22 DIAGNOSIS — K5730 Diverticulosis of large intestine without perforation or abscess without bleeding: Secondary | ICD-10-CM | POA: Diagnosis not present

## 2016-11-22 DIAGNOSIS — I10 Essential (primary) hypertension: Secondary | ICD-10-CM | POA: Insufficient documentation

## 2016-11-22 DIAGNOSIS — R1011 Right upper quadrant pain: Secondary | ICD-10-CM | POA: Diagnosis not present

## 2016-11-22 LAB — BASIC METABOLIC PANEL
ANION GAP: 12 (ref 5–15)
BUN: 16 mg/dL (ref 6–20)
CALCIUM: 10.5 mg/dL — AB (ref 8.9–10.3)
CHLORIDE: 101 mmol/L (ref 101–111)
CO2: 23 mmol/L (ref 22–32)
CREATININE: 1.06 mg/dL (ref 0.61–1.24)
GFR calc non Af Amer: >60 mL/min (ref >60)
Glucose, Bld: 166 mg/dL — ABNORMAL HIGH (ref 65–99)
Potassium: 3.7 mmol/L (ref 3.5–5.1)
SODIUM: 136 mmol/L (ref 135–145)

## 2016-11-22 LAB — CBC
HCT: 46.6 % (ref 39.0–52.0)
Hemoglobin: 16.9 g/dL (ref 13.0–17.0)
MCH: 31 pg (ref 26.0–34.0)
MCHC: 36.3 g/dL — ABNORMAL HIGH (ref 30.0–36.0)
MCV: 85.3 fL (ref 78.0–100.0)
PLATELETS: 261 10*3/uL (ref 150–400)
RBC: 5.46 MIL/uL (ref 4.22–5.81)
RDW: 12.6 % (ref 11.5–15.5)
WBC: 7.3 10*3/uL (ref 4.0–10.5)

## 2016-11-22 LAB — I-STAT TROPONIN, ED: TROPONIN I, POC: 0 ng/mL (ref 0.00–0.08)

## 2016-11-22 MED ORDER — LISINOPRIL-HYDROCHLOROTHIAZIDE 20-25 MG PO TABS: 1.0000 | ORAL_TABLET | Freq: Every day | ORAL | 1 refills | Status: DC

## 2016-11-22 MED ORDER — HYDROCHLOROTHIAZIDE 25 MG PO TABS
25.0000 mg | ORAL_TABLET | Freq: Every day | ORAL | Status: DC
  Administered 2016-11-22: 25 mg via ORAL
  Filled 2016-11-22: qty 1

## 2016-11-22 MED ORDER — IOPAMIDOL (ISOVUE-300) INJECTION 61%
100.0000 mL | Freq: Once | INTRAVENOUS | Status: AC | PRN
  Administered 2016-11-22: 100 mL via INTRAVENOUS

## 2016-11-22 NOTE — ED Triage Notes (Addendum)
Pt reports high bp x 1 week and pain in his right groin. BP was worse tonight and sharp pain in his groin got worse today.

## 2016-11-22 NOTE — ED Provider Notes (Signed)
[facility] DEPT Provider Note   CSN: [id number] Arrival date & time: 11/22/16  2131     History   Chief Complaint Chief Complaint  Patient presents with  . Hypertension    HPI Karl Atkins is a 51 y.o. male.  HPI  The pt is a 51 y/o mal Hx of CM, Htn, Hyperlipidemia and hx of massive weight loss of > 100 pounds over 5 years - which was mostly intentional.  He has several complatins this evening including:  1.  Hypertension - despite taking his lisinopril and his amlodipine, he continues to have times of the day where his blood pressure spikes and becomes high, he says he can tell when this happens clinically though he cannot tell me what he feels, takes a half a dose of his blood pressure medications at night which seems to help. He denies chest pain shortness of breath numbness weakness blurred vision or headaches.  2.  Over the course of the week he has had some right lower quadrant and inguinal region pain, this has been persistent, gradually worsening, does not seem to be associated with eating drinking bowel movements or urination. He denies prior abdominal surgery. No vomiting  Past Medical History:  Diagnosis Date  . Diabetes mellitus    type 2  . Diabetic neuropathy ([place]) 05/16/2016  . Headache    hx migraines mid 1990's  . Hyperlipidemia   . Hypertension   . Obesity     Patient Active Problem List   Diagnosis Date Noted  . Diabetic neuropathy ([place]) 05/16/2016  . Hypertension   . Hyperlipidemia   . DIABETES MELLITUS, TYPE II 04/09/2010  . OBESITY 04/09/2010    Past Surgical History:  Procedure Laterality Date  . BACK SURGERY  2014   lower back  . COLONOSCOPY WITH PROPOFOL N/A 04/19/2016   Procedure: COLONOSCOPY WITH PROPOFOL;  Surgeon: Garlan Fair, MD;  Location: [facility];  Service: Endoscopy;  Laterality: N/A;  . MIDDLE EAR SURGERY     x2       Home Medications    Prior to Admission medications   Medication Sig Start Date End Date  Taking? Authorizing Provider  amLODipine (NORVASC) 10 MG tablet Take 10 mg by mouth daily before lunch. 03/09/16  Yes [provider]  fenofibrate (TRICOR) 145 MG tablet Take 145 mg by mouth daily before lunch.    Yes [provider]  gabapentin (NEURONTIN) 300 MG capsule Take 300 mg by mouth 3 (three) times daily as needed (for pain/nerve pain.).   Yes [provider]  glyBURIDE-metformin (GLUCOVANCE) 5-500 MG per tablet Take 1-2 tablets by mouth 2 (two) times daily. 2 tablets in the morning and 1 tablet in the evening.   Yes [provider]  lisinopril (PRINIVIL,ZESTRIL) 40 MG tablet Take 40 mg by mouth daily before lunch.   Yes [provider]  meloxicam (MOBIC) 15 MG tablet Take 15 mg by mouth daily.    Yes [provider]  Multiple Vitamin (MULTIVITAMIN WITH MINERALS) TABS tablet Take 1 tablet by mouth daily before lunch.   Yes [provider]  vitamin C (ASCORBIC ACID) 500 MG tablet Take 500 mg by mouth daily before lunch.   Yes [provider]  zolpidem (AMBIEN) 10 MG tablet Take 5-10 mg by mouth at bedtime.   Yes [provider]  lisinopril-hydrochlorothiazide (PRINZIDE,ZESTORETIC) 20-25 MG tablet Take 1 tablet by mouth daily. 11/22/16   Noemi Chapel, MD    Family History Family History  Problem Relation Age of Onset  . Diabetes Father   . Heart failure Father   . Diabetes Mother   . Heart failure Mother   . Hypertension Mother     Social History Social History  Substance Use Topics  . Smoking status: Never Smoker  . Smokeless tobacco: Never Used  . Alcohol use 0.0 oz/week     Comment: rarely     Allergies   Codeine and Pollen extract-tree extract   Review of Systems Review of Systems  All other systems reviewed and are negative.    Physical Exam Updated Vital Signs BP (!) 162/97   Pulse 95   Temp 98.3 F (36.8 C) (Oral)   Resp 16   Ht 6\' 1"  (1.854 m)   Wt 106.6 kg (235 lb)    SpO2 99%   BMI 31.00 kg/m   Physical Exam  Constitutional: He appears well-developed and well-nourished. No distress.  HENT:  Head: Normocephalic and atraumatic.  Mouth/Throat: Oropharynx is clear and moist. No oropharyngeal exudate.  Eyes: Pupils are equal, round, and reactive to light. Conjunctivae and EOM are normal. Right eye exhibits no discharge. Left eye exhibits no discharge. No scleral icterus.  Neck: Normal range of motion. Neck supple. No JVD present. No thyromegaly present.  Cardiovascular: Normal rate, regular rhythm, normal heart sounds and intact distal pulses.  Exam reveals no gallop and no friction rub.   No murmur heard. Pulmonary/Chest: Effort normal and breath sounds normal. No respiratory distress. He has no wheezes. He has no rales.  Abdominal: Soft. Bowel sounds are normal. He exhibits no distension and no mass. There is no tenderness ( fullness and TTP in the RLQ, no other ttp).  Musculoskeletal: Normal range of motion. He exhibits no edema or tenderness.  Lymphadenopathy:    He has no cervical adenopathy.  Neurological: He is alert. Coordination normal.  Normal speech, coordination and strenght diffusely  Skin: Skin is warm and dry. No rash noted. No erythema.  Psychiatric: He has a normal mood and affect. His behavior is normal.  Nursing note and vitals reviewed.    ED Treatments / Results  Labs (all labs ordered are listed, but only abnormal results are displayed) Labs Reviewed  BASIC METABOLIC PANEL - Abnormal; Notable for the following:       Result Value   Glucose, Bld 166 (*)    Calcium 10.5 (*)    All other components within normal limits  CBC - Abnormal; Notable for the following:    MCHC 36.3 (*)    All other components within normal limits  I-STAT TROPONIN, ED    EKG  EKG Interpretation  Date/Time:  Tuesday November 22 2016 21:44:30 EDT Ventricular Rate:  107 PR Interval:    QRS Duration: 114 QT Interval:  350 QTC Calculation: 467 R  Axis:   83 Text Interpretation:  Sinus tachycardia Borderline intraventricular conduction delay No old tracing to compare Abnormal ekg Confirmed by Noemi Chapel [phone]) on 11/22/2016 10:10:33 PM       Radiology Ct Abdomen Pelvis W Contrast  Result Date: 11/22/2016 CLINICAL DATA:  Elevated blood pressure for 1 week worse tonight, RIGHT groin pain worsened today, history hypertension, type II diabetes mellitus EXAM: CT ABDOMEN AND PELVIS WITH CONTRAST TECHNIQUE: Multidetector CT imaging of the abdomen and pelvis was performed using the standard protocol following bolus administration of intravenous contrast. Sagittal and coronal MPR images reconstructed from axial data set. CONTRAST:  159mL ISOVUE-300 IOPAMIDOL (ISOVUE-300) INJECTION 61% IV. No oral contrast  administered. COMPARISON:  01/13/2016 FINDINGS: Lower chest: Lung bases clear Hepatobiliary: The gallbladder and liver normal appearance Pancreas: Normal appearance Spleen: Normal appearance. Probable small splenule at splenic hilum. Adrenals/Urinary Tract: Adrenal glands, kidneys, ureters, and bladder normal appearance Stomach/Bowel: Normal appendix. Minimal diverticulosis of distal descending and proximal sigmoid colon without evidence of diverticulitis. Questionable rectal wall thickening versus artifact from underdistention. Stomach and bowel loops otherwise unremarkable. Vascular/Lymphatic: Atherosclerotic calcification aorta without aneurysm. No adenopathy. Scattered pelvic phleboliths. Reproductive: Unremarkable Other: Probable tiny inguinal hernias containing fat larger on RIGHT. No free air or free fluid. No inflammatory process. Musculoskeletal: Bones unremarkable. IMPRESSION: Probable tiny inguinal hernias containing fat. Minimal distal colonic diverticulosis without evidence of diverticulitis. Questionable rectal wall thickening versus artifact from underdistention ; recommend correlation with follow-up proctoscopy to exclude rectal neoplasm.  Electronically Signed   By: Lavonia Dana M.D.   On: 11/22/2016 23:22    Procedures Procedures (including critical care time)  Medications Ordered in ED Medications  hydrochlorothiazide (HYDRODIURIL) tablet 25 mg (not administered)  iopamidol (ISOVUE-300) 61 % injection 100 mL (100 mLs Intravenous Contrast Given 11/22/16 2245)     Initial Impression / Assessment and Plan / ED Course  I have reviewed the triage vital signs and the nursing notes.  Pertinent labs & imaging results that were available during my care of the patient were reviewed by me and considered in my medical decision making (see chart for details).     Exam is benign other than abdominal exam. Check labs and CT to r/o other soures of pain - has had some early satiety.  CT without acute findings Pt informed of rectal findings - need for proctoscopy - agreeable to this. BP is 169/90 on my repeat exam. Start HCTZ in addition to other mds F/u in 1 week for recheck Pt agreeable.  Final Clinical Impressions(s) / ED Diagnoses   Final diagnoses:  Essential hypertension  Right lower quadrant abdominal pain    New Prescriptions New Prescriptions   LISINOPRIL-HYDROCHLOROTHIAZIDE (PRINZIDE,ZESTORETIC) 20-25 MG TABLET    Take 1 tablet by mouth daily.     Noemi Chapel, MD 11/22/16 [phone]

## 2016-11-22 NOTE — ED Notes (Signed)
Gave EKG to [facility]

## 2016-11-22 NOTE — Discharge Instructions (Signed)
Your testing today showed no acute findings in your blood work and your CT scan showed no acute findings either - it did suggest that you should have a proctoscopy given the abnormal appearance of your rectum on the CT scan but this was non specific - Please share this with Dr. Luan Pulling this week and arrange follow up with your GI doctor.  Start taking the Lisinopril / HCTZ combination pill instead of the Lisinopril 40mg  tablet that you have been taking - this may help control your blood pressure in a better way.  You should take your blood pressure once a day - 2 hours after you have taken your medicines.  ER for severe or worsening symptoms.

## 2016-12-05 DIAGNOSIS — H7012 Chronic mastoiditis, left ear: Secondary | ICD-10-CM | POA: Diagnosis not present

## 2016-12-05 DIAGNOSIS — H6122 Impacted cerumen, left ear: Secondary | ICD-10-CM | POA: Diagnosis not present

## 2016-12-05 DIAGNOSIS — H6692 Otitis media, unspecified, left ear: Secondary | ICD-10-CM | POA: Diagnosis not present

## 2016-12-05 DIAGNOSIS — H6062 Unspecified chronic otitis externa, left ear: Secondary | ICD-10-CM | POA: Diagnosis not present

## 2016-12-15 DIAGNOSIS — G4700 Insomnia, unspecified: Secondary | ICD-10-CM | POA: Diagnosis not present

## 2016-12-15 DIAGNOSIS — E785 Hyperlipidemia, unspecified: Secondary | ICD-10-CM | POA: Diagnosis not present

## 2016-12-15 DIAGNOSIS — I10 Essential (primary) hypertension: Secondary | ICD-10-CM | POA: Diagnosis not present

## 2016-12-15 DIAGNOSIS — E119 Type 2 diabetes mellitus without complications: Secondary | ICD-10-CM | POA: Diagnosis not present

## 2017-01-09 DIAGNOSIS — I10 Essential (primary) hypertension: Secondary | ICD-10-CM | POA: Diagnosis not present

## 2017-01-09 DIAGNOSIS — E119 Type 2 diabetes mellitus without complications: Secondary | ICD-10-CM | POA: Diagnosis not present

## 2017-01-09 DIAGNOSIS — G4700 Insomnia, unspecified: Secondary | ICD-10-CM | POA: Diagnosis not present

## 2017-01-09 DIAGNOSIS — E785 Hyperlipidemia, unspecified: Secondary | ICD-10-CM | POA: Diagnosis not present

## 2017-05-15 DIAGNOSIS — N5201 Erectile dysfunction due to arterial insufficiency: Secondary | ICD-10-CM | POA: Diagnosis not present

## 2017-05-15 DIAGNOSIS — S8411XA Injury of peroneal nerve at lower leg level, right leg, initial encounter: Secondary | ICD-10-CM | POA: Diagnosis not present

## 2017-05-15 DIAGNOSIS — Z6833 Body mass index (BMI) 33.0-33.9, adult: Secondary | ICD-10-CM | POA: Diagnosis not present

## 2017-11-14 DIAGNOSIS — Z6831 Body mass index (BMI) 31.0-31.9, adult: Secondary | ICD-10-CM | POA: Diagnosis not present

## 2017-11-14 DIAGNOSIS — M4716 Other spondylosis with myelopathy, lumbar region: Secondary | ICD-10-CM | POA: Diagnosis not present

## 2017-11-14 DIAGNOSIS — S8411XD Injury of peroneal nerve at lower leg level, right leg, subsequent encounter: Secondary | ICD-10-CM | POA: Diagnosis not present

## 2017-11-28 DIAGNOSIS — E669 Obesity, unspecified: Secondary | ICD-10-CM | POA: Diagnosis not present

## 2017-11-28 DIAGNOSIS — I10 Essential (primary) hypertension: Secondary | ICD-10-CM | POA: Diagnosis not present

## 2017-11-28 DIAGNOSIS — B029 Zoster without complications: Secondary | ICD-10-CM | POA: Diagnosis not present

## 2017-11-28 DIAGNOSIS — E119 Type 2 diabetes mellitus without complications: Secondary | ICD-10-CM | POA: Diagnosis not present

## 2017-12-05 DIAGNOSIS — H7192 Unspecified cholesteatoma, left ear: Secondary | ICD-10-CM | POA: Diagnosis not present

## 2017-12-05 DIAGNOSIS — H6122 Impacted cerumen, left ear: Secondary | ICD-10-CM | POA: Diagnosis not present

## 2017-12-05 DIAGNOSIS — H7012 Chronic mastoiditis, left ear: Secondary | ICD-10-CM | POA: Diagnosis not present

## 2018-01-04 DIAGNOSIS — M21371 Foot drop, right foot: Secondary | ICD-10-CM | POA: Diagnosis not present

## 2018-02-09 DIAGNOSIS — H6522 Chronic serous otitis media, left ear: Secondary | ICD-10-CM | POA: Diagnosis not present

## 2018-02-09 DIAGNOSIS — J320 Chronic maxillary sinusitis: Secondary | ICD-10-CM | POA: Diagnosis not present

## 2018-02-09 DIAGNOSIS — H7012 Chronic mastoiditis, left ear: Secondary | ICD-10-CM | POA: Diagnosis not present

## 2018-02-09 DIAGNOSIS — H6692 Otitis media, unspecified, left ear: Secondary | ICD-10-CM | POA: Diagnosis not present

## 2018-02-20 DIAGNOSIS — Z6832 Body mass index (BMI) 32.0-32.9, adult: Secondary | ICD-10-CM | POA: Diagnosis not present

## 2018-02-20 DIAGNOSIS — S8411XD Injury of peroneal nerve at lower leg level, right leg, subsequent encounter: Secondary | ICD-10-CM | POA: Diagnosis not present

## 2018-02-20 DIAGNOSIS — M4716 Other spondylosis with myelopathy, lumbar region: Secondary | ICD-10-CM | POA: Diagnosis not present

## 2018-02-21 DIAGNOSIS — H6502 Acute serous otitis media, left ear: Secondary | ICD-10-CM | POA: Diagnosis not present

## 2018-02-21 DIAGNOSIS — H6692 Otitis media, unspecified, left ear: Secondary | ICD-10-CM | POA: Diagnosis not present

## 2018-09-04 DIAGNOSIS — E039 Hypothyroidism, unspecified: Secondary | ICD-10-CM | POA: Diagnosis not present

## 2018-09-04 DIAGNOSIS — E119 Type 2 diabetes mellitus without complications: Secondary | ICD-10-CM | POA: Diagnosis not present

## 2018-09-04 DIAGNOSIS — E785 Hyperlipidemia, unspecified: Secondary | ICD-10-CM | POA: Diagnosis not present

## 2018-09-04 DIAGNOSIS — L97901 Non-pressure chronic ulcer of unspecified part of unspecified lower leg limited to breakdown of skin: Secondary | ICD-10-CM | POA: Diagnosis not present

## 2018-09-18 ENCOUNTER — Telehealth (HOSPITAL_COMMUNITY): Payer: Self-pay | Admitting: Physical Therapy

## 2018-09-18 ENCOUNTER — Other Ambulatory Visit: Payer: Self-pay

## 2018-09-18 ENCOUNTER — Encounter (HOSPITAL_COMMUNITY): Payer: Self-pay | Admitting: Physical Therapy

## 2018-09-18 ENCOUNTER — Encounter (HOSPITAL_COMMUNITY): Payer: Self-pay

## 2018-09-18 ENCOUNTER — Ambulatory Visit (HOSPITAL_COMMUNITY): Payer: BLUE CROSS/BLUE SHIELD | Admitting: Physical Therapy

## 2018-09-18 NOTE — Therapy (Deleted)
[place], [place], [address] Phone: [phone]   Fax:  [phone]  Patient Details  Name: Karl Atkins MRN: [id number] Date of Birth: 1965-12-05 Referring Provider:  Sinda Du, MD  Encounter Date: 09/18/2018   Patient arrived without a mask and refused to don one; educated at the entrance to the clinic that he will need to wear a mask to receive skilled services, however patient was very short with PT about mask requirement, kept walking further into clinic, and refused to don mask even for the short walk back to the wound room.  Once in wound room, continued to educate patient that he will need to at least wear mask in common areas and can take if off once in a private room.   When asked if he has a medical condition preventing him from wearing a mask, patient became angry and said "I don't have a medical reason for not wearing one, I just can't breathe with one on!!!! 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   Due to [place], unable to evaluate and treat this patient due to non-compliance with required mask wear. Will be happy to provide care if patient agrees to and is compliant with mask requirement.   Deniece Ree PT, DPT, CBIS  Supplemental Physical Therapist [facility]    Pager [phone] Acute Rehab Office [phone]      Hunt Oris 09/18/2018, 11:35 AM  [place], [place], [address] Phone: [phone]   Fax:  [phone]

## 2018-09-18 NOTE — Therapy (Signed)
Patient arrived without a mask and refused to don one; educated at the entrance to the clinic that he will need to wear a mask to receive skilled services, however patient was very short with PT about mask requirement, kept walking further into clinic, and refused to don mask even for the short walk back to the wound room.  Once in wound room, continued to educate patient that he will need to at least wear mask in common areas and can take if off once in a private room.   When asked if he has a medical condition preventing him from wearing a mask, patient became angry and said "I don't have a medical reason for not wearing one, I just can't breathe with one on!!!! 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   Due to [place], unable to evaluate and treat this patient due to non-compliance with required mask wear. Will be happy to provide care if patient agrees to and is compliant with mask requirement.   Deniece Ree PT, DPT, CBIS  Supplemental Physical Therapist [facility]    Pager [phone] Acute Rehab Office [phone]

## 2018-09-18 NOTE — Telephone Encounter (Signed)
pt came into the office today for a wound evaluation and left without being seen due to the pt did not want to wear a mask. His appt was unarrived.

## 2018-09-18 NOTE — Therapy (Deleted)
[place], [place], [address] Phone: [phone]   Fax:  [phone]  Patient Details  Name: Karl Atkins MRN: [id number] Date of Birth: January 22, 1966 Referring Provider:  Sinda Du, MD  Encounter Date: 09/18/2018  Patient arrived without a mask and refused to don one; educated at the entrance to the clinic that he will need to wear a mask to receive skilled services, however patient was very short with PT about mask requirement, kept walking further into clinic, and refused to don mask even for the short walk back to the wound room.  Once in wound room, continued to educate patient that he will need to at least wear mask in common areas and can take if off once in a private room.   When asked if he has a medical condition preventing him from wearing a mask, patient became angry and said "I don't have a medical reason for not wearing one, I just can't breathe with one on!!!! 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   Due to [place], unable to evaluate and treat this patient due to non-compliance with required mask wear. Will be happy to provide care if patient agrees to and is compliant with mask requirement.    Deniece Ree PT, DPT, CBIS  Supplemental Physical Therapist [facility]    Pager [phone] Acute Rehab Office [place] [address] [place], [place], [address] Phone: [phone]   Fax:  [phone]

## 2019-01-17 DIAGNOSIS — E785 Hyperlipidemia, unspecified: Secondary | ICD-10-CM | POA: Diagnosis not present

## 2019-01-17 DIAGNOSIS — E039 Hypothyroidism, unspecified: Secondary | ICD-10-CM | POA: Diagnosis not present

## 2019-01-17 DIAGNOSIS — E119 Type 2 diabetes mellitus without complications: Secondary | ICD-10-CM | POA: Diagnosis not present

## 2019-01-21 DIAGNOSIS — Z0000 Encounter for general adult medical examination without abnormal findings: Secondary | ICD-10-CM | POA: Diagnosis not present

## 2019-01-28 ENCOUNTER — Other Ambulatory Visit (HOSPITAL_COMMUNITY): Payer: Self-pay | Admitting: Pulmonary Disease

## 2019-01-28 ENCOUNTER — Ambulatory Visit (HOSPITAL_COMMUNITY)
Admission: RE | Admit: 2019-01-28 | Discharge: 2019-01-28 | Disposition: A | Discharge status: Home / Self Care | Payer: BC Managed Care – PPO | Source: Ambulatory Visit | Attending: Pulmonary Disease | Admitting: Pulmonary Disease

## 2019-01-28 ENCOUNTER — Other Ambulatory Visit: Payer: Self-pay

## 2019-01-28 DIAGNOSIS — R0789 Other chest pain: Secondary | ICD-10-CM

## 2019-01-28 DIAGNOSIS — I10 Essential (primary) hypertension: Secondary | ICD-10-CM | POA: Diagnosis not present

## 2019-01-28 DIAGNOSIS — R079 Chest pain, unspecified: Secondary | ICD-10-CM | POA: Diagnosis not present

## 2019-07-11 DIAGNOSIS — Z20828 Contact with and (suspected) exposure to other viral communicable diseases: Secondary | ICD-10-CM | POA: Diagnosis not present

## 2019-07-11 DIAGNOSIS — U071 COVID-19: Secondary | ICD-10-CM | POA: Diagnosis not present

## 2019-07-11 DIAGNOSIS — Z03818 Encounter for observation for suspected exposure to other biological agents ruled out: Secondary | ICD-10-CM | POA: Diagnosis not present

## 2019-08-22 ENCOUNTER — Encounter (INDEPENDENT_AMBULATORY_CARE_PROVIDER_SITE_OTHER): Payer: Self-pay

## 2019-08-22 ENCOUNTER — Encounter (INDEPENDENT_AMBULATORY_CARE_PROVIDER_SITE_OTHER): Payer: Self-pay | Admitting: Internal Medicine

## 2019-08-22 ENCOUNTER — Ambulatory Visit (INDEPENDENT_AMBULATORY_CARE_PROVIDER_SITE_OTHER): Payer: BC Managed Care – PPO | Admitting: Internal Medicine

## 2019-08-22 ENCOUNTER — Other Ambulatory Visit (INDEPENDENT_AMBULATORY_CARE_PROVIDER_SITE_OTHER): Payer: Self-pay

## 2019-08-22 ENCOUNTER — Other Ambulatory Visit: Payer: Self-pay

## 2019-08-22 VITALS — BP 130/70 | HR 82 | Temp 97.1°F | Resp 18 | Ht 73.0 in | Wt 225.0 lb

## 2019-08-22 DIAGNOSIS — E782 Mixed hyperlipidemia: Secondary | ICD-10-CM

## 2019-08-22 DIAGNOSIS — R5383 Other fatigue: Secondary | ICD-10-CM

## 2019-08-22 DIAGNOSIS — E119 Type 2 diabetes mellitus without complications: Secondary | ICD-10-CM | POA: Diagnosis not present

## 2019-08-22 DIAGNOSIS — E663 Overweight: Secondary | ICD-10-CM | POA: Diagnosis not present

## 2019-08-22 DIAGNOSIS — E559 Vitamin D deficiency, unspecified: Secondary | ICD-10-CM | POA: Diagnosis not present

## 2019-08-22 DIAGNOSIS — R5381 Other malaise: Secondary | ICD-10-CM | POA: Diagnosis not present

## 2019-08-22 DIAGNOSIS — M4306 Spondylolysis, lumbar region: Secondary | ICD-10-CM

## 2019-08-22 DIAGNOSIS — Z125 Encounter for screening for malignant neoplasm of prostate: Secondary | ICD-10-CM

## 2019-08-22 MED ORDER — LISINOPRIL-HYDROCHLOROTHIAZIDE 20-25 MG PO TABS: 1.0000 | ORAL_TABLET | Freq: Every day | ORAL | 0 refills | Status: DC

## 2019-08-22 NOTE — Progress Notes (Signed)
Metrics: Intervention Frequency ACO  Documented Smoking Status Yearly  Screened one or more times in 24 months  Cessation Counseling or  Active cessation medication Past 24 months  Past 24 months   Guideline developer: UpToDate (See UpToDate for funding source) Date Released: 2014       Wellness Office Visit  Subjective:  Patient ID: Karl Atkins, male    DOB: [birth year]  Age: 54 y.o. MRN: [id number]  CC: This 54 year old man comes to our practice as a new patient to be established.  His previous physician, Dr. Sinda Du, has retired more than 6 months ago. HPI  The patient used to be on antihypertensive medication but he has lost over 100 pounds and has managed to discontinue these medications. He continues to take Glucovance for his diabetes.  He tells me his last hemoglobin A1c measured more than 6 months ago was around 7% to his recollection. He takes fenofibrate for triglycerides I presume. He also takes melatonin to help him sleep. He does describe fatigue and lack of energy mostly and he thinks it is related to his medications.  He wants to become healthier and get off most of the medications he is on. He has had back surgery by Dr. Carloyn Manner, neurosurgeon in the past.  This is for lumbar spondylosis with myelopathy. Past Medical History:  Diagnosis Date  . Diabetes mellitus 2011   type 2  . Diabetic neuropathy ([place]) 05/16/2016  . Headache    hx migraines mid 1990's  . Hyperlipidemia   . Hypertension   . Obesity    Past Surgical History:  Procedure Laterality Date  . BACK SURGERY  2014   lower back  . COLONOSCOPY WITH PROPOFOL N/A 04/19/2016   Procedure: COLONOSCOPY WITH PROPOFOL;  Surgeon: Garlan Fair, MD;  Location: [facility];  Service: Endoscopy;  Laterality: N/A;  . MIDDLE EAR SURGERY     x2     Family History  Problem Relation Age of Onset  . Diabetes Father   . Heart failure Father   . Diabetes Mother   . Heart failure Mother   . Hypertension Mother    . 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   Social History   Tobacco Use  . Smoking status: Never Smoker  . Smokeless tobacco: Never Used  Substance Use Topics  . Alcohol use: Yes    Comment: occ    Current Meds  Medication Sig  . fenofibrate (TRICOR) 145 MG tablet Take 145 mg by mouth daily before lunch.   . gabapentin (NEURONTIN) 300 MG capsule Take 300 mg by mouth 3 (three) times daily as needed (for pain/nerve pain.).  Marland Kitchen glyBURIDE-metformin (GLUCOVANCE) 5-500 MG per tablet Take 1-2 tablets by mouth 2 (two) times daily. 2 tablets in the morning and 1 tablet in the evening.  . melatonin 3 MG TABS tablet Take 1.5 mg by mouth at bedtime.  . meloxicam (MOBIC) 15 MG tablet Take 15 mg by mouth daily.   . Multiple Vitamin (MULTIVITAMIN WITH MINERALS) TABS tablet Take 1 tablet by mouth daily before lunch.  . vitamin C (ASCORBIC ACID) 500 MG tablet Take 500 mg by mouth daily before lunch.      No flowsheet data found.   Objective:   Today's Vitals: BP 130/70 (BP Location: Right Arm, Patient Position: Sitting, Cuff Size: Normal)   Pulse 82   Temp (!) 97.1  F (36.2 C) (Temporal)   Resp 18   Ht 6\' 1"  (1.854 m)   Wt 225 lb (102.1 kg) Comment: with leg brace and clothing.  SpO2 99% Comment: was 88 w/mask on.  BMI 29.69 kg/m  Vitals with BMI 08/22/2019 11/22/2016 11/22/2016  Height 6\' 1"  - -  Weight 225 lbs - -  BMI 10.27 - -  Systolic 253 664 403  Diastolic 70 97 97  Pulse 82 95 -     Physical Exam  He looks systemically well.  He is overweight.  Blood pressure is well controlled.  He is alert and orientated without any obvious focal neurological signs.     Assessment   1. Lumbar spondylolysis   2. Mixed hyperlipidemia   3. Diabetes mellitus without complication ([place])   4. Overweight (BMI 25.0-29.9)   5. Malaise and fatigue   6. Special screening for  malignant neoplasm of prostate   7. Vitamin D deficiency disease       Tests ordered Orders Placed This Encounter  Procedures  . COMPLETE METABOLIC PANEL WITH GFR  . Hemoglobin A1c  . Lipid panel  . CBC  . T3, free  . T4  . TSH  . PSA, Total with Reflex to PSA, Free  . VITAMIN D 25 Hydroxy (Vit-D Deficiency, Fractures)     Plan: 1. Blood work is ordered. 2. He will continue with Glucovance for his diabetes and we will see what his hemoglobin A1c is. 3. He will continue with fenofibrate and I will check a lipid panel. 4. I have told him that hopefully we can get him off most of these medications based on nutrition and exercise. 5. Further recommendations will depend on the results and I will follow up with him and discuss all this in the next few weeks.   No orders of the defined types were placed in this encounter.   Doree Albee, MD

## 2019-08-26 ENCOUNTER — Other Ambulatory Visit (INDEPENDENT_AMBULATORY_CARE_PROVIDER_SITE_OTHER): Payer: Self-pay

## 2019-08-26 MED ORDER — LISINOPRIL-HYDROCHLOROTHIAZIDE 20-25 MG PO TABS: 1.0000 | ORAL_TABLET | Freq: Every day | ORAL | 0 refills | Status: DC

## 2019-08-27 LAB — COMPLETE METABOLIC PANEL WITH GFR
AG Ratio: 1.8 (calc) (ref 1.0–2.5)
ALT: 15 U/L (ref 9–46)
AST: 20 U/L (ref 10–35)
Albumin: 4.2 g/dL (ref 3.6–5.1)
Alkaline phosphatase (APISO): 28 U/L — ABNORMAL LOW (ref 35–144)
BUN/Creatinine Ratio: 18 (calc) (ref 6–22)
BUN: 26 mg/dL — ABNORMAL HIGH (ref 7–25)
CO2: 29 mmol/L (ref 20–32)
Calcium: 9.9 mg/dL (ref 8.6–10.3)
Chloride: 102 mmol/L (ref 98–110)
Creat: 1.44 mg/dL — ABNORMAL HIGH (ref 0.70–1.33)
GFR, Est African American: 63 mL/min/{1.73_m2} (ref >=60)
GFR, Est Non African American: 55 mL/min/{1.73_m2} — ABNORMAL LOW (ref >=60)
Globulin: 2.3 g/dL (calc) (ref 1.9–3.7)
Glucose, Bld: 181 mg/dL — ABNORMAL HIGH (ref 65–99)
Potassium: 4.6 mmol/L (ref 3.5–5.3)
Sodium: 137 mmol/L (ref 135–146)
Total Bilirubin: 0.8 mg/dL (ref 0.2–1.2)
Total Protein: 6.5 g/dL (ref 6.1–8.1)

## 2019-08-27 LAB — CBC
HCT: 41.1 % (ref 38.5–50.0)
Hemoglobin: 14.2 g/dL (ref 13.2–17.1)
MCH: 30.9 pg (ref 27.0–33.0)
MCHC: 34.5 g/dL (ref 32.0–36.0)
MCV: 89.5 fL (ref 80.0–100.0)
MPV: 11.1 fL (ref 7.5–12.5)
Platelets: 256 10*3/uL (ref 140–400)
RBC: 4.59 10*6/uL (ref 4.20–5.80)
RDW: 12.9 % (ref 11.0–15.0)
WBC: 4.6 10*3/uL (ref 3.8–10.8)

## 2019-08-27 LAB — LIPID PANEL
Cholesterol: 205 mg/dL — ABNORMAL HIGH (ref <200)
HDL: 40 mg/dL (ref >=40)
LDL Cholesterol (Calc): 125 mg/dL (calc) — ABNORMAL HIGH
Non-HDL Cholesterol (Calc): 165 mg/dL (calc) — ABNORMAL HIGH (ref <130)
Total CHOL/HDL Ratio: 5.1 (calc) — ABNORMAL HIGH (ref <5.0)
Triglycerides: 263 mg/dL — ABNORMAL HIGH (ref <150)

## 2019-08-27 LAB — T3, FREE: T3, Free: 3 pg/mL (ref 2.3–4.2)

## 2019-08-27 LAB — HEMOGLOBIN A1C
Hgb A1c MFr Bld: 6.5 % of total Hgb — ABNORMAL HIGH (ref <5.7)
Mean Plasma Glucose: 140 (calc)
eAG (mmol/L): 7.7 (calc)

## 2019-08-27 LAB — VITAMIN D 25 HYDROXY (VIT D DEFICIENCY, FRACTURES): Vit D, 25-Hydroxy: 29 ng/mL — ABNORMAL LOW (ref 30–100)

## 2019-08-27 LAB — PSA, TOTAL WITH REFLEX TO PSA, FREE

## 2019-08-27 LAB — TSH: TSH: 1.69 mIU/L (ref 0.40–4.50)

## 2019-08-27 LAB — T4: T4, Total: 6.8 ug/dL (ref 4.9–10.5)

## 2019-09-04 LAB — PSA, TOTAL WITH REFLEX TO PSA, FREE: PSA, Total: 0.2 ng/mL (ref <=4.0)

## 2019-10-09 ENCOUNTER — Encounter (INDEPENDENT_AMBULATORY_CARE_PROVIDER_SITE_OTHER): Payer: Self-pay | Admitting: Internal Medicine

## 2019-10-09 ENCOUNTER — Other Ambulatory Visit: Payer: Self-pay

## 2019-10-09 ENCOUNTER — Ambulatory Visit (INDEPENDENT_AMBULATORY_CARE_PROVIDER_SITE_OTHER): Payer: BC Managed Care – PPO | Admitting: Internal Medicine

## 2019-10-09 VITALS — BP 121/70 | HR 63 | Temp 97.2°F | Resp 18 | Ht 73.0 in | Wt 222.0 lb

## 2019-10-09 DIAGNOSIS — E663 Overweight: Secondary | ICD-10-CM

## 2019-10-09 DIAGNOSIS — N529 Male erectile dysfunction, unspecified: Secondary | ICD-10-CM

## 2019-10-09 DIAGNOSIS — E291 Testicular hypofunction: Secondary | ICD-10-CM | POA: Diagnosis not present

## 2019-10-09 DIAGNOSIS — E559 Vitamin D deficiency, unspecified: Secondary | ICD-10-CM

## 2019-10-09 DIAGNOSIS — E119 Type 2 diabetes mellitus without complications: Secondary | ICD-10-CM | POA: Diagnosis not present

## 2019-10-09 DIAGNOSIS — E782 Mixed hyperlipidemia: Secondary | ICD-10-CM | POA: Diagnosis not present

## 2019-10-09 DIAGNOSIS — Z8249 Family history of ischemic heart disease and other diseases of the circulatory system: Secondary | ICD-10-CM

## 2019-10-09 DIAGNOSIS — R5383 Other fatigue: Secondary | ICD-10-CM

## 2019-10-09 DIAGNOSIS — R5381 Other malaise: Secondary | ICD-10-CM

## 2019-10-09 NOTE — Progress Notes (Signed)
Metrics: Intervention Frequency ACO  Documented Smoking Status Yearly  Screened one or more times in 24 months  Cessation Counseling or  Active cessation medication Past 24 months  Past 24 months   Guideline developer: UpToDate (See UpToDate for funding source) Date Released: 2014       Wellness Office Visit  Subjective:  Patient ID: Karl Atkins, male    DOB: 1965-10-28  Age: 54 y.o. MRN: [id number]  CC:  This man comes in for follow-up of diabetes, hyperlipidemia, hypertension and a follow-up of his blood work. HPI  He has vitamin D deficiency. His hemoglobin A1c is 6.5%.  He tells me that he has been taking Glucovance only 1 tablet daily and even then his blood sugar levels tend to be lower and he has to take some sugar to improve the numbers. He has hyperlipidemia. He takes fenofibrate for hyperlipidemia and hypertriglyceridemia. He continues on levothyroxine although is not really not seen much benefit with this.  This is a small dose he is on. Today he did describe to me erectile dysfunction any says he gets penile injections that will help him with this on a regular basis.  However, he describes significant fatigue also as well as erectile dysfunction. Past Medical History:  Diagnosis Date  . Diabetes mellitus 2011   type 2  . Diabetic neuropathy ([place]) 05/16/2016  . Headache    hx migraines mid 1990's  . Hyperlipidemia   . Hypertension   . Obesity    Past Surgical History:  Procedure Laterality Date  . BACK SURGERY  2014   lower back  . COLONOSCOPY WITH PROPOFOL N/A 04/19/2016   Procedure: COLONOSCOPY WITH PROPOFOL;  Surgeon: Garlan Fair, MD;  Location: [facility];  Service: Endoscopy;  Laterality: N/A;  . MIDDLE EAR SURGERY     x2     Family History  Problem Relation Age of Onset  . Diabetes Father   . Heart failure Father   . Diabetes Mother   . Heart failure Mother   . Hypertension Mother   . Diabetes Sister     Social History   Social History  Narrative   Married for 20 years.Lives with wife.Previously Nurse, learning disability.Owns car wash.   Social History   Tobacco Use  . Smoking status: Never Smoker  . Smokeless tobacco: Never Used  Substance Use Topics  . Alcohol use: Yes    Comment: occ    Current Meds  Medication Sig  . fenofibrate (TRICOR) 145 MG tablet Take 145 mg by mouth daily before lunch.   . gabapentin (NEURONTIN) 300 MG capsule Take 300 mg by mouth 3 (three) times daily as needed (for pain/nerve pain.).  Marland Kitchen glyBURIDE-metformin (GLUCOVANCE) 5-500 MG per tablet Take 1-2 tablets by mouth 2 (two) times daily. 2 tablets in the morning and 1 tablet in the evening.  Marland Kitchen levothyroxine (SYNTHROID) 25 MCG tablet Take 25 mcg by mouth daily before breakfast.  . lisinopril-hydrochlorothiazide (ZESTORETIC) 20-25 MG tablet Take 1 tablet by mouth daily.  . melatonin 3 MG TABS tablet Take 1.5 mg by mouth at bedtime.  . meloxicam (MOBIC) 15 MG tablet Take 15 mg by mouth daily.   . Multiple Vitamin (MULTIVITAMIN WITH MINERALS) TABS tablet Take 1 tablet by mouth daily before lunch.  . vitamin C (ASCORBIC ACID) 500 MG tablet Take 500 mg by mouth daily before lunch.      No flowsheet data found.   Objective:   Today's Vitals: BP 121/70 (BP Location: Right Arm, Patient  Position: Sitting, Cuff Size: Normal)   Pulse 63   Temp (!) 97.2 F (36.2 C) (Temporal)   Resp 18   Ht 6\' 1"  (1.854 m)   Wt 222 lb (100.7 kg) Comment: 219lb  at home un dressed.  SpO2 98%   BMI 29.29 kg/m  Vitals with BMI 10/09/2019 08/22/2019 11/22/2016  Height 6\' 1"  6\' 1"  -  Weight 222 lbs 225 lbs -  BMI 59.7 41.63 -  Systolic 845 364 680  Diastolic 70 70 97  Pulse 63 82 95     Physical Exam  He looks systemically well.  He remains overweight but he has lost 3 pounds since last time I saw him.  Blood pressure is excellent.     Assessment   1. Mixed hyperlipidemia   2. Diabetes mellitus without complication ([place])   3. Malaise and fatigue   4.  Overweight (BMI 25.0-29.9)   5. Vitamin D deficiency disease   6. Erectile dysfunction, unspecified erectile dysfunction type   7. FH: heart disease       Tests ordered Orders Placed This Encounter  Procedures  . Testosterone Total,Free,Bio, Males  . Ambulatory referral to Cardiology     Plan: 1. I will check testosterone levels in view of his erectile dysfunction and I am sure his levels are going to be low. 2. He would like to see a cardiologist in view of his family history of heart disease and I agree with the stress test and the cardiologist should be able to set this up. 3. I have told him to start taking vitamin D3 10,000 units daily. 4. I have told him to discontinue levothyroxine and we will repeat thyroid function test in the future. 5. We discussed nutrition and I discussed the blue zones with him, focusing on a plant-based diet and reduction of animal proteins.  I have told him he must drink about 100 ounces of water daily. 6. Follow-up in about a month's time to see how he is doing and we will discuss testosterone therapy at that time I imagine.   No orders of the defined types were placed in this encounter.   Doree Albee, MD

## 2019-10-10 LAB — TESTOSTERONE TOTAL,FREE,BIO, MALES
Albumin: 4.8 g/dL (ref 3.6–5.1)
Sex Hormone Binding: 58 nmol/L — ABNORMAL HIGH (ref 10–50)
Testosterone, Bioavailable: 112.3 ng/dL (ref >=110.0)
Testosterone, Free: 51.4 pg/mL (ref 46.0–224.0)
Testosterone: 618 ng/dL (ref 250–827)

## 2019-11-06 ENCOUNTER — Other Ambulatory Visit: Payer: Self-pay

## 2019-11-06 ENCOUNTER — Encounter (INDEPENDENT_AMBULATORY_CARE_PROVIDER_SITE_OTHER): Payer: Self-pay | Admitting: Internal Medicine

## 2019-11-06 ENCOUNTER — Ambulatory Visit (INDEPENDENT_AMBULATORY_CARE_PROVIDER_SITE_OTHER): Payer: BC Managed Care – PPO | Admitting: Internal Medicine

## 2019-11-06 VITALS — BP 130/80 | HR 78 | Temp 97.1°F | Resp 17 | Ht 73.0 in | Wt 223.0 lb

## 2019-11-06 DIAGNOSIS — N529 Male erectile dysfunction, unspecified: Secondary | ICD-10-CM

## 2019-11-06 NOTE — Progress Notes (Signed)
Metrics: Intervention Frequency ACO  Documented Smoking Status Yearly  Screened one or more times in 24 months  Cessation Counseling or  Active cessation medication Past 24 months  Past 24 months   Guideline developer: UpToDate (See UpToDate for funding source) Date Released: 2014       Wellness Office Visit  Subjective:  Patient ID: Karl Atkins, male    DOB: Aug 17, 1965  Age: 54 y.o. MRN: [id number]  CC: This man comes in to discuss his blood work regarding testosterone levels. HPI  He has erectile dysfunction and sees a urologist for penile injections.  I discussed his testosterone levels with him which are in the normal range but I see that his free testosterone is at the lower end of normal. Past Medical History:  Diagnosis Date  . Diabetes mellitus 2011   type 2  . Diabetic neuropathy ([place]) 05/16/2016  . Headache    hx migraines mid 1990's  . Hyperlipidemia   . Hypertension   . Obesity    Past Surgical History:  Procedure Laterality Date  . BACK SURGERY  2014   lower back  . COLONOSCOPY WITH PROPOFOL N/A 04/19/2016   Procedure: COLONOSCOPY WITH PROPOFOL;  Surgeon: Garlan Fair, MD;  Location: [facility];  Service: Endoscopy;  Laterality: N/A;  . MIDDLE EAR SURGERY     x2     Family History  Problem Relation Age of Onset  . Diabetes Father   . Heart failure Father   . Diabetes Mother   . Heart failure Mother   . Hypertension Mother   . 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   Social History   Tobacco Use  . Smoking status: Never Smoker  . Smokeless tobacco: Never Used  Substance Use Topics  . Alcohol use: Yes    Comment: occ    Current Meds  Medication Sig  . cetirizine (ZYRTEC) 5 MG tablet Take 5 mg by mouth daily.  . Cholecalciferol (VITAMIN D3) 125 MCG (5000 UT) CAPS Take 2 capsules by mouth daily.  . fenofibrate (TRICOR) 145 MG tablet  Take 145 mg by mouth daily before lunch.   . gabapentin (NEURONTIN) 300 MG capsule Take 300 mg by mouth 3 (three) times daily as needed (for pain/nerve pain.).  Marland Kitchen levothyroxine (SYNTHROID) 25 MCG tablet Take 25 mcg by mouth daily before breakfast.  . lisinopril-hydrochlorothiazide (ZESTORETIC) 20-25 MG tablet Take 1 tablet by mouth daily.  . melatonin 3 MG TABS tablet Take 1.5 mg by mouth at bedtime.  . meloxicam (MOBIC) 15 MG tablet Take 15 mg by mouth daily.   . Multiple Vitamin (MULTIVITAMIN WITH MINERALS) TABS tablet Take 1 tablet by mouth daily before lunch.  . vitamin C (ASCORBIC ACID) 500 MG tablet Take 500 mg by mouth daily before lunch.  . Zinc Sulfate (ZINC 15 PO) Take by mouth.      No flowsheet data found.   Objective:   Today's Vitals: BP 130/80 (BP Location: Left Arm, Patient Position: Sitting, Cuff Size: Normal)   Pulse 78   Temp (!) 97.1 F (36.2 C) (Temporal)   Resp 17   Ht 6\' 1"  (1.854 m)   Wt 223 lb (101.2 kg) Comment: 217lb at home undresssed.  SpO2 98%   BMI 29.42 kg/m  Vitals with BMI 11/06/2019 10/09/2019 08/22/2019  Height 6\' 1"  6\' 1"  6\' 1"   Weight 223 lbs 222 lbs 225  lbs  BMI 29.43 15.0 41.36  Systolic 438 377 939  Diastolic 80 70 70  Pulse 78 63 82     Physical Exam  He remains overweight.  Blood pressure is acceptable.  No new physical findings.     Assessment   1. Erectile dysfunction, unspecified erectile dysfunction type       Tests ordered No orders of the defined types were placed in this encounter.    Plan: 1. I discussed his testosterone levels and reflection of symptoms with erectile dysfunction.  I believe the testosterone therapy may help him and I discussed the FDA warnings, benefits and side effects and also mode of administration.  I told him to think about this and discuss it with his wife and he will get back to me on the next visit. 2. Follow-up in about 6 weeks when we will do the blood work for all his other chronic  medical problems also.  I asked him about COVID-19 vaccination and he does not want to take the vaccination.  He also was uncomfortable with documentation of unvaccinated status and I showed him his chart and I advised him that all [facility] providers will have this information of unvaccinated state.  I offered him that if he does not feel comfortable then I would recommend that he seek another provider not in the [facility] health system.   No orders of the defined types were placed in this encounter.   Doree Albee, MD

## 2019-11-19 ENCOUNTER — Other Ambulatory Visit (INDEPENDENT_AMBULATORY_CARE_PROVIDER_SITE_OTHER): Payer: Self-pay | Admitting: Internal Medicine

## 2019-12-31 ENCOUNTER — Ambulatory Visit (INDEPENDENT_AMBULATORY_CARE_PROVIDER_SITE_OTHER): Payer: BC Managed Care – PPO | Admitting: Internal Medicine

## 2020-01-13 ENCOUNTER — Ambulatory Visit (INDEPENDENT_AMBULATORY_CARE_PROVIDER_SITE_OTHER): Payer: BC Managed Care – PPO | Admitting: Nurse Practitioner

## 2020-01-13 ENCOUNTER — Encounter (INDEPENDENT_AMBULATORY_CARE_PROVIDER_SITE_OTHER): Payer: Self-pay | Admitting: Nurse Practitioner

## 2020-01-13 ENCOUNTER — Telehealth (INDEPENDENT_AMBULATORY_CARE_PROVIDER_SITE_OTHER): Payer: Self-pay | Admitting: Nurse Practitioner

## 2020-01-13 ENCOUNTER — Other Ambulatory Visit: Payer: Self-pay

## 2020-01-13 VITALS — BP 174/98 | HR 79 | Temp 97.8°F | Ht 73.0 in | Wt 225.2 lb

## 2020-01-13 DIAGNOSIS — R0782 Intercostal pain: Secondary | ICD-10-CM | POA: Diagnosis not present

## 2020-01-13 DIAGNOSIS — R1011 Right upper quadrant pain: Secondary | ICD-10-CM | POA: Diagnosis not present

## 2020-01-13 NOTE — Telephone Encounter (Signed)
I ordered CT scan for patient at today's office visit.  Please try to get this prior Auth initiated as soon as possible as I would like to have CT scan completed within the next few days.  Thank you.

## 2020-01-13 NOTE — Telephone Encounter (Signed)
Working on prior [employer] now; TBA on approval.

## 2020-01-13 NOTE — Progress Notes (Signed)
Subjective:  Patient ID: Karl Atkins, male    DOB: [birth year]  Age: 54 y.o. MRN: [id number]  CC:  Chief Complaint  Patient presents with  . Abdominal Pain    right sided front abdominal pain radiates into right back side, started about a week and a half ago      HPI  This patient arrives today for the above.  This patient comes in today for an acute visit for the above.  He tells me about 2-1/2 to 3 weeks ago he was lifting objects at work and he did experience some back pain.  The back pain seems to be improving since then, however about 1/2 weeks ago he started experiencing right upper quadrant pain that radiates to his back.  He describes pain as sharp and tells me it does wax and wane in intensity.  It ranges from mild to a 7-8/10 intensity.  It is tender to touch.  He also mentions that the tenderness goes up the right side of his chest wall.  Tells me laying down makes it worse.  Getting up and moving seems to improve the pain.  He does not notice any other triggers such as foods or type of foods.  Past Medical History:  Diagnosis Date  . Diabetes mellitus 2011   type 2  . Diabetic neuropathy ([place]) 05/16/2016  . Headache    hx migraines mid 1990's  . Hyperlipidemia   . Hypertension   . Obesity       Family History  Problem Relation Age of Onset  . Diabetes Father   . Heart failure Father   . Diabetes Mother   . Heart failure Mother   . Hypertension Mother   . 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   Social History   Tobacco Use  . Smoking status: Never Smoker  . Smokeless tobacco: Never Used  Substance Use Topics  . Alcohol use: Yes    Comment: occ     Current Meds  Medication Sig  . cetirizine (ZYRTEC) 5 MG tablet Take 5 mg by mouth daily.  . Cholecalciferol (VITAMIN D3) 125 MCG (5000 UT) CAPS Take 2 capsules by mouth daily.  . fenofibrate  (TRICOR) 145 MG tablet TAKE ONE TABLET ONCE DAILY  . gabapentin (NEURONTIN) 300 MG capsule Take 300 mg by mouth 3 (three) times daily as needed (for pain/nerve pain.).  Marland Kitchen levothyroxine (SYNTHROID) 25 MCG tablet Take 25 mcg by mouth daily before breakfast.  . lisinopril-hydrochlorothiazide (ZESTORETIC) 20-25 MG tablet Take 1 tablet by mouth daily.  . melatonin 3 MG TABS tablet Take 1.5 mg by mouth at bedtime.  . meloxicam (MOBIC) 15 MG tablet Take 15 mg by mouth daily.   . Multiple Vitamin (MULTIVITAMIN WITH MINERALS) TABS tablet Take 1 tablet by mouth daily before lunch.  . vitamin C (ASCORBIC ACID) 500 MG tablet Take 500 mg by mouth daily before lunch.  . Zinc Sulfate (ZINC 15 PO) Take by mouth.    ROS:  Review of Systems  Constitutional: Negative for fever.  Respiratory: Negative for cough and shortness of breath.   Cardiovascular: Negative for chest pain.  Gastrointestinal: Positive for abdominal pain. Negative for blood in stool, diarrhea, heartburn, melena, nausea and vomiting.     Objective:   Today's Vitals: BP (!) 174/98   Pulse 79   Temp 97.8 F (36.6 C) (Temporal)  Ht '6\' 1"'  (1.854 m)   Wt 225 lb 3.2 oz (102.2 kg)   SpO2 99%   BMI 29.71 kg/m  Vitals with BMI 01/13/2020 11/06/2019 10/09/2019  Height '6\' 1"'  '6\' 1"'  '6\' 1"'   Weight 225 lbs 3 oz 223 lbs 222 lbs  BMI 29.72 35.36 14.4  Systolic 315 400 867  Diastolic 98 80 70  Pulse 79 78 63     Physical Exam Vitals reviewed.  Constitutional:      Appearance: Normal appearance.  HENT:     Head: Normocephalic and atraumatic.  Cardiovascular:     Rate and Rhythm: Normal rate and regular rhythm.  Pulmonary:     Effort: Pulmonary effort is normal.     Breath sounds: Normal breath sounds.  Abdominal:     General: Abdomen is flat. Bowel sounds are decreased. There is no distension.     Palpations: Abdomen is soft. There is no hepatomegaly, splenomegaly or mass.     Tenderness: There is abdominal tenderness in the right  upper quadrant. There is right CVA tenderness. There is no guarding or rebound. Negative signs include Murphy's sign.     Hernia: No hernia is present.  Musculoskeletal:     Cervical back: Neck supple.  Skin:    General: Skin is warm and dry.  Neurological:     Mental Status: He is alert and oriented to person, place, and time.  Psychiatric:        Mood and Affect: Mood normal.        Behavior: Behavior normal.        Thought Content: Thought content normal.        Judgment: Judgment normal.          Assessment and Plan   1. RUQ pain   2. Intercostal pain      Plan: 1.,  2.  Etiology uncertain at this time.  High on my differential list includes cholecystitis and/or pancreatitis.  Will send for CT scan of abdomen and chest as well as check CMP and lipase level.  He was told that if his symptoms worsen especially if he experiences severe colicky pain, bloody emesis, coffee-ground emesis, blood in his stool, or dark tarry stools that he needs to go to the emergency department.  He tells me he understands.   Tests ordered Orders Placed This Encounter  Procedures  . CT CHEST ABDOMEN PELVIS W CONTRAST  . CMP with eGFR([facility])  . Lipase      No orders of the defined types were placed in this encounter.   Patient to follow-up later this week or sooner as needed.  Ailene Ards, NP

## 2020-01-14 ENCOUNTER — Telehealth (INDEPENDENT_AMBULATORY_CARE_PROVIDER_SITE_OTHER): Payer: Self-pay | Admitting: Nurse Practitioner

## 2020-01-14 ENCOUNTER — Emergency Department (HOSPITAL_COMMUNITY): Payer: BC Managed Care – PPO

## 2020-01-14 ENCOUNTER — Other Ambulatory Visit: Payer: Self-pay

## 2020-01-14 ENCOUNTER — Emergency Department (HOSPITAL_COMMUNITY)
Admission: EM | Admit: 2020-01-14 | Discharge: 2020-01-14 | Disposition: A | Discharge status: Home / Self Care | Payer: BC Managed Care – PPO | Attending: Emergency Medicine | Admitting: Emergency Medicine

## 2020-01-14 ENCOUNTER — Encounter (HOSPITAL_COMMUNITY): Payer: Self-pay | Admitting: Emergency Medicine

## 2020-01-14 DIAGNOSIS — Z7984 Long term (current) use of oral hypoglycemic drugs: Secondary | ICD-10-CM | POA: Diagnosis not present

## 2020-01-14 DIAGNOSIS — R109 Unspecified abdominal pain: Secondary | ICD-10-CM | POA: Diagnosis not present

## 2020-01-14 DIAGNOSIS — I10 Essential (primary) hypertension: Secondary | ICD-10-CM | POA: Diagnosis not present

## 2020-01-14 DIAGNOSIS — E119 Type 2 diabetes mellitus without complications: Secondary | ICD-10-CM | POA: Insufficient documentation

## 2020-01-14 DIAGNOSIS — R1013 Epigastric pain: Secondary | ICD-10-CM | POA: Insufficient documentation

## 2020-01-14 DIAGNOSIS — R1011 Right upper quadrant pain: Secondary | ICD-10-CM

## 2020-01-14 DIAGNOSIS — R079 Chest pain, unspecified: Secondary | ICD-10-CM | POA: Diagnosis not present

## 2020-01-14 LAB — COMPLETE METABOLIC PANEL WITH GFR
AG Ratio: 1.8 (calc) (ref 1.0–2.5)
ALT: 14 U/L (ref 9–46)
AST: 18 U/L (ref 10–35)
Albumin: 4.8 g/dL (ref 3.6–5.1)
Alkaline phosphatase (APISO): 33 U/L — ABNORMAL LOW (ref 35–144)
BUN/Creatinine Ratio: 15 (calc) (ref 6–22)
BUN: 20 mg/dL (ref 7–25)
CO2: 30 mmol/L (ref 20–32)
Calcium: 11 mg/dL — ABNORMAL HIGH (ref 8.6–10.3)
Chloride: 103 mmol/L (ref 98–110)
Creat: 1.35 mg/dL — ABNORMAL HIGH (ref 0.70–1.33)
GFR, Est African American: 68 mL/min/{1.73_m2} (ref >=60)
GFR, Est Non African American: 59 mL/min/{1.73_m2} — ABNORMAL LOW (ref >=60)
Globulin: 2.6 g/dL (calc) (ref 1.9–3.7)
Glucose, Bld: 146 mg/dL — ABNORMAL HIGH (ref 65–99)
Potassium: 4.8 mmol/L (ref 3.5–5.3)
Sodium: 140 mmol/L (ref 135–146)
Total Bilirubin: 0.6 mg/dL (ref 0.2–1.2)
Total Protein: 7.4 g/dL (ref 6.1–8.1)

## 2020-01-14 LAB — LIPASE: Lipase: 174 U/L — ABNORMAL HIGH (ref 7–60)

## 2020-01-14 LAB — TROPONIN I (HIGH SENSITIVITY): Troponin I (High Sensitivity): 4 ng/L (ref <18)

## 2020-01-14 MED ORDER — FENTANYL CITRATE (PF) 100 MCG/2ML IJ SOLN
50.0000 ug | Freq: Once | INTRAMUSCULAR | Status: AC
Start: 2020-01-14 — End: 2020-01-14
  Administered 2020-01-14: 50 ug via INTRAVENOUS
  Filled 2020-01-14: qty 2

## 2020-01-14 MED ORDER — IOHEXOL 300 MG/ML  SOLN
100.0000 mL | Freq: Once | INTRAMUSCULAR | Status: AC | PRN
  Administered 2020-01-14: 100 mL via INTRAVENOUS

## 2020-01-14 MED ORDER — FAMOTIDINE IN NACL 20-0.9 MG/50ML-% IV SOLN
20.0000 mg | Freq: Once | INTRAVENOUS | Status: AC
  Administered 2020-01-14: 20 mg via INTRAVENOUS
  Filled 2020-01-14: qty 50

## 2020-01-14 MED ORDER — LIDOCAINE 5 % EX PTCH: 1.0000 | MEDICATED_PATCH | CUTANEOUS | 0 refills | Status: DC

## 2020-01-14 MED ORDER — LISINOPRIL 10 MG PO TABS
20.0000 mg | ORAL_TABLET | Freq: Once | ORAL | Status: AC
  Administered 2020-01-14: 20 mg via ORAL
  Filled 2020-01-14: qty 2

## 2020-01-14 MED ORDER — OXYCODONE HCL 5 MG PO TABS
5.0000 mg | ORAL_TABLET | ORAL | 0 refills | Status: DC | PRN
Start: 2020-01-14 — End: 2020-02-27

## 2020-01-14 MED ORDER — ALUM & MAG HYDROXIDE-SIMETH 200-200-20 MG/5ML PO SUSP
30.0000 mL | Freq: Once | ORAL | Status: AC
  Administered 2020-01-14: 30 mL via ORAL
  Filled 2020-01-14: qty 30

## 2020-01-14 MED ORDER — HYDROCHLOROTHIAZIDE 25 MG PO TABS
25.0000 mg | ORAL_TABLET | Freq: Once | ORAL | Status: AC
  Administered 2020-01-14: 25 mg via ORAL
  Filled 2020-01-14: qty 1

## 2020-01-14 NOTE — ED Triage Notes (Signed)
Pt c/o abdominal pain that began a week ago. Pt was sent to the ED by Dr. Lanice Shirts office due to an elevated lipase level.

## 2020-01-14 NOTE — ED Provider Notes (Signed)
[facility] Emergency Department Provider Note MRN:  [id number]  Arrival date & time: 01/14/20     Chief Complaint   Abdominal Pain   History of Present Illness   Karl Atkins is a 54 y.o. year-old male with a history of diabetes, hypertension presenting to the ED with chief complaint of abdominal pain.  Epigastric and right upper quadrant abdominal pain for the past week, gradual onset, progressively worsening.  Described as a dull pain worse with palpation.  Denies fever, no nausea or vomiting, no diarrhea or constipation.  No lower abdominal pain.  Sent here from PCP office for elevated lipase numbers.  Review of Systems  A complete 10 system review of systems was obtained and all systems are negative except as noted in the HPI and PMH.   Patient's Health History    Past Medical History:  Diagnosis Date  . Diabetes mellitus 2011   type 2  . Diabetic neuropathy ([place]) 05/16/2016  . Headache    hx migraines mid 1990's  . Hyperlipidemia   . Hypertension   . Obesity     Past Surgical History:  Procedure Laterality Date  . BACK SURGERY  2014   lower back  . COLONOSCOPY WITH PROPOFOL N/A 04/19/2016   Procedure: COLONOSCOPY WITH PROPOFOL;  Surgeon: Garlan Fair, MD;  Location: [facility];  Service: Endoscopy;  Laterality: N/A;  . MIDDLE EAR SURGERY     x2    Family History  Problem Relation Age of Onset  . Diabetes Father   . Heart failure Father   . Diabetes Mother   . Heart failure Mother   . Hypertension Mother   . Diabetes Sister     Social History   Socioeconomic History  . Marital status: Married    Spouse name: Not on file  . Number of children: Not on file  . Years of education: Not on file  . Highest education level: Not on file  Occupational History  . Occupation: Nurse, learning disability  Tobacco Use  . Smoking status: Never Smoker  . Smokeless tobacco: Never Used  Vaping Use  . Vaping Use: Never used  Substance and Sexual  Activity  . Alcohol use: Yes    Comment: occ  . Drug use: No  . Sexual activity: Not on file  Other Topics Concern  . Not on file  Social History Narrative   Married for 20 years.Lives with wife.Previously Nurse, learning disability.Owns car wash.   Social Determinants of Health   Financial Resource Strain:   . Difficulty of Paying Living Expenses: Not on file  Food Insecurity:   . Worried About Charity fundraiser in the Last Year: Not on file  . Ran Out of Food in the Last Year: Not on file  Transportation Needs:   . Lack of Transportation (Medical): Not on file  . Lack of Transportation (Non-Medical): Not on file  Physical Activity:   . Days of Exercise per Week: Not on file  . Minutes of Exercise per Session: Not on file  Stress:   . Feeling of Stress : Not on file  Social Connections:   . Frequency of Communication with Friends and Family: Not on file  . Frequency of Social Gatherings with Friends and Family: Not on file  . Attends Religious Services: Not on file  . Active Member of Clubs or Organizations: Not on file  . Attends Archivist Meetings: Not on file  . Marital Status: Not on file  Intimate Partner Violence:   . Fear of Current or Ex-Partner: Not on file  . Emotionally Abused: Not on file  . Physically Abused: Not on file  . Sexually Abused: Not on file     Physical Exam   Vitals:   01/14/20 1900 01/14/20 1930  BP: (!) 181/116 (!) 184/92  Pulse: 72 77  Resp: (!) 9 10  Temp:    SpO2: 98% 97%    CONSTITUTIONAL: Well-appearing, NAD NEURO:  Alert and oriented x 3, no focal deficits EYES:  eyes equal and reactive ENT/NECK:  no LAD, no JVD CARDIO: Regular rate, well-perfused, normal S1 and S2 PULM:  CTAB no wheezing or rhonchi GI/GU:  normal bowel sounds, non-distended, non-tender MSK/SPINE:  No gross deformities, no edema SKIN:  no rash, atraumatic PSYCH:  Appropriate speech and behavior  *Additional and/or pertinent findings included in MDM  below  Diagnostic and Interventional Summary    EKG Interpretation  Date/Time:  Tuesday January 14 2020 17:21:03 EST Ventricular Rate:  74 PR Interval:    QRS Duration: 110 QT Interval:  404 QTC Calculation: 449 R Axis:   58 Text Interpretation: Sinus rhythm Left atrial enlargement Confirmed by Gerlene Fee [phone]) on 01/14/2020 5:46:27 PM      Labs Reviewed  TROPONIN I (HIGH SENSITIVITY)  TROPONIN I (HIGH SENSITIVITY)    US Abdomen Limited RUQ (LIVER/GB)  Final Result    DG Chest Port 1 View  Final Result    CT ABDOMEN PELVIS W CONTRAST  Final Result      Medications  iohexol (OMNIPAQUE) 300 MG/ML solution 100 mL (100 mLs Intravenous Contrast Given 01/14/20 1433)  alum & mag hydroxide-simeth (MAALOX/MYLANTA) 200-200-20 MG/5ML suspension 30 mL (30 mLs Oral Given 01/14/20 1830)  famotidine (PEPCID) IVPB 20 mg premix (0 mg Intravenous Stopped 01/14/20 1904)  fentaNYL (SUBLIMAZE) injection 50 mcg (50 mcg Intravenous Given 01/14/20 1828)  lisinopril (ZESTRIL) tablet 20 mg (20 mg Oral Given 01/14/20 1828)  hydrochlorothiazide (HYDRODIURIL) tablet 25 mg (25 mg Oral Given 01/14/20 1828)     Procedures  /  Critical Care Procedures  ED Course and Medical Decision Making  I have reviewed the triage vital signs, the nursing notes, and pertinent available records from the EMR.  Listed above are laboratory and imaging tests that I personally ordered, reviewed, and interpreted and then considered in my medical decision making (see below for details).  Epigastric and right upper quadrant pain with report of elevated lipase, suspicious for pancreatitis.  CT scan is without acute process, normal pancreas.  This expands the differential diagnosis to include cardiac etiology, costochondritis, gastritis, still some concern for cholelithiasis given the location of pain.  Will follow up with right upper quadrant ultrasound, add on troponin, attempt symptomatic control.     Work-up is  reassuring, ultrasound normal, troponin negative.  On closer inspection patient has some hypersensitivity to the skin in this right upper quadrant region, potentially in a dermatomal distribution.  There is no rash.  Still there remains the possibility of shingles.  Favoring this versus costochondritis given the tenderness to the ribs in this area.  Less likely gastritis/pancreatitis.  Either way with this reassuring work-up and normal vital signs patient is appropriate for discharge with pain control.  Barth Kirks. Sedonia Small, [place] [email or web address]  Final Clinical Impressions(s) / ED Diagnoses     ICD-10-CM   1. RUQ abdominal pain  R10.11 US Abdomen Limited RUQ (LIVER/GB)    US Abdomen Limited  RUQ (LIVER/GB)    ED Discharge Orders         Ordered    lidocaine (LIDODERM) 5 %  Every 24 hours        01/14/20 2023    oxyCODONE (ROXICODONE) 5 MG immediate release tablet  Every 4 hours PRN        01/14/20 2023           Discharge Instructions Discussed with and Provided to Patient:     Discharge Instructions     You were evaluated in the Emergency Department and after careful evaluation, we did not find any emergent condition requiring admission or further testing in the hospital.  Your exam/testing today was overall reassuring.  Your CT scan and ultrasound did not show any issues with your pancreas or gallbladder.  Your pain seems to be due to inflammation of the chest wall due to costochondritis or shingles.  We recommend Tylenol 1000 mg every 4-6 hours, Motrin 600 mg every 4-6 hours.  We also recommend using the Lidoderm patches as directed.  For more significant pain keeping you from sleeping at night you can use the oxycodone tablets provided.  We recommend close follow-up with your PCP.  Please return to the Emergency Department if you experience any worsening of your condition.  Thank you for allowing [place] to be a part of your  care.        Maudie Flakes, MD 01/14/20 2026

## 2020-01-14 NOTE — Discharge Instructions (Addendum)
You were evaluated in the Emergency Department and after careful evaluation, we did not find any emergent condition requiring admission or further testing in the hospital.  Your exam/testing today was overall reassuring.  Your CT scan and ultrasound did not show any issues with your pancreas or gallbladder.  Your pain seems to be due to inflammation of the chest wall due to costochondritis or shingles.  We recommend Tylenol 1000 mg every 4-6 hours, Motrin 600 mg every 4-6 hours.  We also recommend using the Lidoderm patches as directed.  For more significant pain keeping you from sleeping at night you can use the oxycodone tablets provided.  We recommend close follow-up with your PCP.  Please return to the Emergency Department if you experience any worsening of your condition.  Thank you for allowing [place] to be a part of your care.

## 2020-01-14 NOTE — Telephone Encounter (Addendum)
Staff member called patient earlier today to let him know that his CT scan was scheduled later in December. This conversation happened after I discussed case with Dr. Anastasio Champion regarding recommendation that patient proceed to the ER. Patient requested that I call and speak to the ER physician on his behalf regarding the need for him to be there in the emergency department.  I did call the ER physician in briefed him on the situation.  Patient remains in emergency department awaiting further evaluation and treatment.

## 2020-01-15 ENCOUNTER — Encounter (INDEPENDENT_AMBULATORY_CARE_PROVIDER_SITE_OTHER): Payer: Self-pay | Admitting: Internal Medicine

## 2020-01-15 ENCOUNTER — Ambulatory Visit (INDEPENDENT_AMBULATORY_CARE_PROVIDER_SITE_OTHER): Payer: BC Managed Care – PPO | Admitting: Internal Medicine

## 2020-01-15 VITALS — BP 128/73 | HR 77 | Temp 97.3°F | Resp 17 | Ht 73.0 in | Wt 225.0 lb

## 2020-01-15 DIAGNOSIS — R1011 Right upper quadrant pain: Secondary | ICD-10-CM | POA: Diagnosis not present

## 2020-01-15 NOTE — Progress Notes (Signed)
Metrics: Intervention Frequency ACO  Documented Smoking Status Yearly  Screened one or more times in 24 months  Cessation Counseling or  Active cessation medication Past 24 months  Past 24 months   Guideline developer: UpToDate (See UpToDate for funding source) Date Released: 2014       Wellness Office Visit  Subjective:  Patient ID: Karl Atkins, male    DOB: 1965-04-27  Age: 54 y.o. MRN: [id number]  CC: This man comes in for follow-up after emergency room visit yesterday.  He went in there with right upper quadrant abdominal pain. HPI  Judson Roch had seen him for the same complaint and was trying to arrange an ultrasound of the abdomen.  When he went yesterday he did get the ultrasound and a CT scan which both were negative for hepatobiliary disease.  The right upper quadrant abdominal pain is not as severe but still somewhat present.  It does not seem to cause any nausea or vomiting.  His lipase was elevated but not severely so.  I do not think he did have pancreatitis. Past Medical History:  Diagnosis Date  . Diabetes mellitus 2011   type 2  . Diabetic neuropathy ([place]) 05/16/2016  . Headache    hx migraines mid 1990's  . Hyperlipidemia   . Hypertension   . Obesity    Past Surgical History:  Procedure Laterality Date  . BACK SURGERY  2014   lower back  . COLONOSCOPY WITH PROPOFOL N/A 04/19/2016   Procedure: COLONOSCOPY WITH PROPOFOL;  Surgeon: Garlan Fair, MD;  Location: [facility];  Service: Endoscopy;  Laterality: N/A;  . MIDDLE EAR SURGERY     x2     Family History  Problem Relation Age of Onset  . Diabetes Father   . Heart failure Father   . Diabetes Mother   . Heart failure Mother   . Hypertension Mother   . 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   Social History   Tobacco Use  . Smoking status: Never Smoker  . Smokeless tobacco: Never Used    Substance Use Topics  . Alcohol use: Yes    Comment: occ    Current Meds  Medication Sig  . cetirizine (ZYRTEC) 5 MG tablet Take 5 mg by mouth daily.  . Cholecalciferol (VITAMIN D3) 125 MCG (5000 UT) CAPS Take 2 capsules by mouth daily.  . fenofibrate (TRICOR) 145 MG tablet TAKE ONE TABLET ONCE DAILY  . gabapentin (NEURONTIN) 300 MG capsule Take 300 mg by mouth 3 (three) times daily as needed (for pain/nerve pain.).  Marland Kitchen glyBURIDE-metformin (GLUCOVANCE) 5-500 MG tablet TAKE TWO (2) TABLETS BY MOUTH 2 TIMES DAILY  . levothyroxine (SYNTHROID) 25 MCG tablet Take 25 mcg by mouth daily before breakfast.  . lidocaine (LIDODERM) 5 % Place 1 patch onto the skin daily. Remove & Discard patch within 12 hours or as directed by MD  . lisinopril-hydrochlorothiazide (ZESTORETIC) 20-25 MG tablet Take 1 tablet by mouth daily.  . melatonin 3 MG TABS tablet Take 1.5 mg by mouth at bedtime.  . meloxicam (MOBIC) 15 MG tablet Take 15 mg by mouth daily.   . Multiple Vitamin (MULTIVITAMIN WITH MINERALS) TABS tablet Take 1 tablet by mouth daily before lunch.  . oxyCODONE (ROXICODONE) 5 MG immediate release tablet Take 1 tablet (5 mg total) by mouth every 4 (four) hours as needed for severe pain.  . vitamin  C (ASCORBIC ACID) 500 MG tablet Take 500 mg by mouth daily before lunch.  . Zinc Sulfate (ZINC 15 PO) Take by mouth.      Depression screen [facility] 2/9 01/13/2020  Decreased Interest 0  Down, Depressed, Hopeless 0  PHQ - 2 Score 0  Altered sleeping 0  Tired, decreased energy 0  Change in appetite 0  Feeling bad or failure about yourself  0  Trouble concentrating 0  Moving slowly or fidgety/restless 0  Suicidal thoughts 0  PHQ-9 Score 0  Difficult doing work/chores Not difficult at all     Objective:   Today's Vitals: BP 128/73 (BP Location: Left Arm, Patient Position: Sitting, Cuff Size: Normal)   Pulse 77   Temp (!) 97.3 F (36.3 C) (Temporal)   Resp 17   Ht 6\' 1"  (1.854 m)   Wt 225 lb (102.1  kg)   SpO2 98%   BMI 29.69 kg/m  Vitals with BMI 01/15/2020 01/14/2020 01/14/2020  Height 6\' 1"  - -  Weight 225 lbs - -  BMI 67.54 - -  Systolic 492 010 071  Diastolic 73 219 92  Pulse 77 74 77     Physical Exam   He looks systemically well and does not appear to be in any discomfort.    Assessment   1. RUQ pain       Tests ordered Orders Placed This Encounter  Procedures  . Ambulatory referral to Gastroenterology     Plan: 1. I will refer him to gastroenterology for further evaluation.  He may need EGD.  In the meantime, I have been recommended that if the right upper quadrant pain comes back and is severe, he will have no option but to go to the emergency room again. 2. Follow-up with Judson Roch in 2 months time for follow-up of chronic conditions.   No orders of the defined types were placed in this encounter.   Doree Albee, MD

## 2020-01-21 ENCOUNTER — Telehealth (INDEPENDENT_AMBULATORY_CARE_PROVIDER_SITE_OTHER): Payer: Self-pay

## 2020-01-21 NOTE — Telephone Encounter (Signed)
Patient called and left a detailed voice message that he would like to be referred to Dr. Ninfa Linden at [facility] for his [facility] referral.  Patient can be reached at [phone].

## 2020-01-21 NOTE — Telephone Encounter (Signed)
Can you look into this?  We did make the referral but the patient now wants to see Dr. Gala Romney as soon as possible as he is constantly having pain.  Thanks.

## 2020-01-21 NOTE — Telephone Encounter (Signed)
Tell the patient that the reason I referred him to gastroenterology is that they usually do EGDs (upper endoscopy).  Surgeons typically do not do this.  This is why I would prefer he see a gastroenterologist and not a Psychologist, sport and exercise.

## 2020-01-21 NOTE — Telephone Encounter (Signed)
I called the patient and gave him the message from Dr. Anastasio Champion. The patient verbalized an understanding.  The patient requested to see Dr. Gala Romney and is still having the same pain and needs an Urgent Referral please.

## 2020-01-22 NOTE — Telephone Encounter (Signed)
Edited and resent referral to Dr Gala Romney office. ASAP for a appt.

## 2020-01-27 ENCOUNTER — Ambulatory Visit (INDEPENDENT_AMBULATORY_CARE_PROVIDER_SITE_OTHER): Payer: BC Managed Care – PPO | Admitting: Cardiology

## 2020-01-27 ENCOUNTER — Other Ambulatory Visit: Payer: Self-pay

## 2020-01-27 ENCOUNTER — Encounter: Payer: Self-pay | Admitting: Cardiology

## 2020-01-27 ENCOUNTER — Encounter: Payer: Self-pay | Admitting: *Deleted

## 2020-01-27 VITALS — BP 148/88 | HR 85 | Resp 16 | Ht 73.0 in | Wt 224.8 lb

## 2020-01-27 DIAGNOSIS — I10 Essential (primary) hypertension: Secondary | ICD-10-CM

## 2020-01-27 DIAGNOSIS — Z8249 Family history of ischemic heart disease and other diseases of the circulatory system: Secondary | ICD-10-CM

## 2020-01-27 DIAGNOSIS — R0609 Other forms of dyspnea: Secondary | ICD-10-CM

## 2020-01-27 DIAGNOSIS — R0600 Dyspnea, unspecified: Secondary | ICD-10-CM

## 2020-01-27 DIAGNOSIS — E782 Mixed hyperlipidemia: Secondary | ICD-10-CM | POA: Diagnosis not present

## 2020-01-27 NOTE — Progress Notes (Signed)
Cardiology Office Note  Date: 01/27/2020   ID: Karl Atkins, DOB March 27, 1965, MRN [id number]  PCP:  Doree Albee, MD  Cardiologist:  Rozann Lesches, MD Electrophysiologist:  None   Chief Complaint  Patient presents with  . Cardiac evaluation    History of Present Illness: Karl Atkins is a 54 y.o. male referred for cardiology consultation by Dr. Anastasio Champion for cardiac evaluation.  He reports a family history of premature CAD.  His father underwent CABG at age 48 and his mother died at age 58 having undergone previous valve surgery.  He reports dyspnea on exertion with highest levels of activity such as exercise, no definite exertional chest pain.  No palpitations or syncope.  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  He stays active in general.  I reviewed his medications which are outlined below.  He states that he has been compliant with therapy.  Blood pressure significantly elevated when he came in, rechecked at 148/88.  He states that he took his Zestoretic around midday.  Personal cardiac risk factors include hypertension, hyperlipidemia, and type 2 diabetes mellitus.  LDL was 125 in July.  He is not on statin therapy.  He has not undergone any recent ischemic testing.  Past Medical History:  Diagnosis Date  . Diabetic neuropathy ([place]) 05/16/2016  . Essential hypertension   . Headache    hx migraines mid 1990's  . Hyperlipidemia   . Obesity   . Type 2 diabetes mellitus ([place]) 2011    Past Surgical History:  Procedure Laterality Date  . BACK SURGERY  2014   lower back  . COLONOSCOPY WITH PROPOFOL N/A 04/19/2016   Procedure: COLONOSCOPY WITH PROPOFOL;  Surgeon: Garlan Fair, MD;  Location: [facility];  Service: Endoscopy;  Laterality: N/A;  . MIDDLE EAR SURGERY     x2    Current Outpatient Medications  Medication Sig Dispense Refill  . cetirizine (ZYRTEC) 5 MG tablet Take 5 mg by mouth daily.    . Cholecalciferol (VITAMIN D3) 125  MCG (5000 UT) CAPS Take 2 capsules by mouth daily.    . fenofibrate (TRICOR) 145 MG tablet TAKE ONE TABLET ONCE DAILY 30 tablet 2  . gabapentin (NEURONTIN) 300 MG capsule Take 300 mg by mouth 3 (three) times daily as needed (for pain/nerve pain.).    Marland Kitchen glyBURIDE-metformin (GLUCOVANCE) 5-500 MG tablet TAKE TWO (2) TABLETS BY MOUTH 2 TIMES DAILY 120 tablet 2  . levothyroxine (SYNTHROID) 25 MCG tablet Take 25 mcg by mouth daily before breakfast.    . lidocaine (LIDODERM) 5 % Place 1 patch onto the skin daily. Remove & Discard patch within 12 hours or as directed by MD 5 patch 0  . lisinopril-hydrochlorothiazide (ZESTORETIC) 20-25 MG tablet Take 1 tablet by mouth daily. 90 tablet 0  . melatonin 3 MG TABS tablet Take 1.5 mg by mouth at bedtime.    . meloxicam (MOBIC) 15 MG tablet Take 15 mg by mouth daily.     . Multiple Vitamin (MULTIVITAMIN WITH MINERALS) TABS tablet Take 1 tablet by mouth daily before lunch.    . oxyCODONE (ROXICODONE) 5 MG immediate release tablet Take 1 tablet (5 mg total) by mouth every 4 (four) hours as needed for severe pain. 8 tablet 0  . vitamin C (ASCORBIC ACID) 500 MG tablet Take 500 mg by mouth daily before lunch.    . Zinc Sulfate (ZINC 15 PO) Take by mouth.     No current  facility-administered medications for this visit.   Allergies:  Bee pollen and Pollen extract-tree extract   Social History: The patient  reports that he has never smoked. He has never used smokeless tobacco. He reports current alcohol use. He reports that he does not use drugs.   Family History: The patient's family history includes Diabetes in his father, mother, and sister; Heart failure in his father and mother; Hypertension in his mother.   ROS: No palpitations or syncope.  Physical Exam: VS:  BP (!) 148/88 (BP Location: Right Arm, Cuff Size: Normal)   Pulse 85   Resp 16   Ht 6\' 1"  (1.854 m)   Wt 224 lb 12.8 oz (102 kg)   SpO2 99%   BMI 29.66 kg/m , BMI Body mass index is 29.66  kg/m.  Wt Readings from Last 3 Encounters:  01/27/20 224 lb 12.8 oz (102 kg)  01/15/20 225 lb (102.1 kg)  01/14/20 225 lb 3.2 oz (102.2 kg)    General: Patient appears comfortable at rest. HEENT: Conjunctiva and lids normal, wearing a mask. Neck: Supple, no elevated JVP or carotid bruits, no thyromegaly. Lungs: Clear to auscultation, nonlabored breathing at rest. Cardiac: Regular rate and rhythm, no S3 or significant systolic murmur, no pericardial rub. Abdomen: Soft, nontender, bowel sounds present. Extremities: No pitting edema, distal pulses 2+. Skin: Warm and dry. Musculoskeletal: No kyphosis. Neuropsychiatric: Alert and oriented x3, affect grossly appropriate.  ECG:  An ECG dated 01/14/2020 was personally reviewed today and demonstrated:  Sinus rhythm with left atrial enlargement.  Recent Labwork: 08/22/2019: Hemoglobin 14.2; Platelets 256; TSH 1.69 01/13/2020: ALT 14; AST 18; BUN 20; Creat 1.35; Potassium 4.8; Sodium 140     Component Value Date/Time   CHOL 205 (H) 08/22/2019 1500   TRIG 263 (H) 08/22/2019 1500   HDL 40 08/22/2019 1500   CHOLHDL 5.1 (H) 08/22/2019 1500   LDLCALC 125 (H) 08/22/2019 1500    Other Studies Reviewed Today:  Abdominal and pelvic CT 01/14/2020: FINDINGS: Lower Chest: No acute findings.  Hepatobiliary: No hepatic masses identified. Gallbladder is unremarkable. No evidence of biliary ductal dilatation.  Pancreas: No mass or inflammatory changes. No evidence of pancreatic calcification or peripancreatic fluid collections.  Spleen: Within normal limits in size and appearance.  Adrenals/Urinary Tract: No masses identified. No evidence of ureteral calculi or hydronephrosis.  Stomach/Bowel: No evidence of obstruction, inflammatory process or abnormal fluid collections. Normal appendix visualized. Mild colonic diverticulosis is noted, however there is no evidence of diverticulitis.  Vascular/Lymphatic: No pathologically enlarged  lymph nodes. No abdominal aortic aneurysm. Aortic atherosclerotic calcification noted.  Reproductive:  No mass or other significant abnormality.  Other:  None.  Musculoskeletal:  No suspicious bone lesions identified.  IMPRESSION: No radiographic evidence of pancreatitis or other acute findings.  Mild colonic diverticulosis, without radiographic evidence of diverticulitis.  Aortic Atherosclerosis (ICD10-I70.0).  Assessment and Plan:  1.  Dyspnea on exertion in a 54 year old male with family history of premature CAD, hypertension, hyperlipidemia, and type 2 diabetes mellitus.  ECG is nonspecific.  He has not undergone any recent ischemic evaluation, we will proceed with a Lexiscan Myoview.  2.  Essential hypertension, reports compliance with Zestoretic.  Blood pressure was elevated today.  Keep follow-up with Dr. Anastasio Champion.  3.  LDL 125 by lab work in July.  Consider stopping TriCor with switch to statin particular with type 2 diabetes mellitus and his family history of premature CAD.  Medication Adjustments/Labs and Tests Ordered: Current medicines are reviewed at length with  the patient today.  Concerns regarding medicines are outlined above.   Tests Ordered: Orders Placed This Encounter  Procedures  . NM Myocar Multi W/Spect W/Wall Motion / EF    Medication Changes: No orders of the defined types were placed in this encounter.   Disposition:  Follow up test results.  Signed, Satira Sark, MD, [facility] 01/27/2020 3:23 PM    [facility] [place], [place], [place] [address] Phone: [phone]; Fax: [phone]

## 2020-01-27 NOTE — Patient Instructions (Addendum)
Medication Instructions:   Your physician recommends that you continue on your current medications as directed. Please refer to the Current Medication list given to you today.  Labwork:  None  Testing/Procedures: Your physician has requested that you have a lexiscan myoview. For further information please visit [email or web address]. Please follow instruction sheet, as given.  Follow-Up:  Your physician recommends that you schedule a follow-up appointment in: pending.   Any Other Special Instructions Will Be Listed Below (If Applicable).  If you need a refill on your cardiac medications before your next appointment, please call your pharmacy.

## 2020-01-28 ENCOUNTER — Telehealth (INDEPENDENT_AMBULATORY_CARE_PROVIDER_SITE_OTHER): Payer: Self-pay

## 2020-01-28 NOTE — Telephone Encounter (Signed)
Pt called for 2 concerns.  1# Pt said the L8 was the most place her was complaining for the pain. But Pt went to see Chiropractor. So feeling much better Like night & Day. It is not full recovery but no, way like it was when it started. Also, The referral is not being used at this time. Due to pt stated he was not contacted. Stated I would contact the Dr Gala Romney office  & being that he is a GI pt at [facility] ; is why they nurse refused the patient . Based on the referral note sent back on several events. Even when the patient stated he wanted to change providers.  Pt said he just wanted to notify Dr Anastasio Champion & Lowry Bowl. To say "thank you"  there diligent help and care given during his illness . 2# the labs on Testosterone & labs to start something now.

## 2020-01-28 NOTE — Telephone Encounter (Signed)
His testosterone levels were in the normal range previously.  We need to do an early morning testosterone level to see if he would qualify.  He can certainly make an appointment to see me for this.  Thanks.

## 2020-01-28 NOTE — Telephone Encounter (Signed)
pT SAID SINCE IT IS IN NORMAL RANGE, I GUESS HE WILL WAIT AND SEE YOU ON NEXT APPOINTMENT.

## 2020-01-29 ENCOUNTER — Ambulatory Visit (HOSPITAL_COMMUNITY)
Admission: RE | Admit: 2020-01-29 | Discharge: 2020-01-29 | Disposition: A | Discharge status: Home / Self Care | Payer: BC Managed Care – PPO | Source: Ambulatory Visit | Attending: Cardiology | Admitting: Cardiology

## 2020-01-29 ENCOUNTER — Encounter (HOSPITAL_COMMUNITY)
Admission: RE | Admit: 2020-01-29 | Discharge: 2020-01-29 | Disposition: A | Discharge status: Home / Self Care | Payer: BC Managed Care – PPO | Source: Ambulatory Visit | Attending: Cardiology | Admitting: Cardiology

## 2020-01-29 ENCOUNTER — Other Ambulatory Visit: Payer: Self-pay

## 2020-01-29 DIAGNOSIS — Z8249 Family history of ischemic heart disease and other diseases of the circulatory system: Secondary | ICD-10-CM | POA: Diagnosis not present

## 2020-01-29 DIAGNOSIS — R0600 Dyspnea, unspecified: Secondary | ICD-10-CM | POA: Diagnosis not present

## 2020-01-29 DIAGNOSIS — R0609 Other forms of dyspnea: Secondary | ICD-10-CM

## 2020-01-29 LAB — NM MYOCAR MULTI W/SPECT W/WALL MOTION / EF
LV dias vol: 104 mL (ref 62–150)
LV sys vol: 27 mL
Peak HR: 101 {beats}/min
RATE: 0.29
Rest HR: 67 {beats}/min
SDS: 0
SRS: 2
SSS: 2
TID: 1.2

## 2020-01-29 MED ORDER — REGADENOSON 0.4 MG/5ML IV SOLN
INTRAVENOUS | Status: AC
  Administered 2020-01-29: 12:00:00 0.4 mg via INTRAVENOUS
  Filled 2020-01-29: qty 5

## 2020-01-29 MED ORDER — SODIUM CHLORIDE FLUSH 0.9 % IV SOLN
INTRAVENOUS | Status: AC
  Administered 2020-01-29: 12:00:00 10 mL via INTRAVENOUS
  Filled 2020-01-29: qty 10

## 2020-01-29 MED ORDER — TECHNETIUM TC 99M TETROFOSMIN IV KIT
30.0000 | PACK | Freq: Once | INTRAVENOUS | Status: AC | PRN
  Administered 2020-01-29: 12:00:00 29 via INTRAVENOUS

## 2020-01-29 MED ORDER — TECHNETIUM TC 99M TETROFOSMIN IV KIT
10.0000 | PACK | Freq: Once | INTRAVENOUS | Status: AC | PRN
  Administered 2020-01-29: 11:00:00 11 via INTRAVENOUS

## 2020-01-30 ENCOUNTER — Telehealth: Payer: Self-pay | Admitting: *Deleted

## 2020-01-30 NOTE — Telephone Encounter (Signed)
-----   Message from Satira Sark, MD sent at 01/29/2020  5:02 PM EST ----- Results reviewed.  Please let him know that the stress test was low risk, no evidence of ischemia to suggest obstructive CAD at this point and LVEF normal.  Would continue with risk factor modification.  We can see him back for a routine visit in 1 year.

## 2020-01-30 NOTE — Telephone Encounter (Signed)
Patient informed. Copy sent to PCP °

## 2020-01-31 ENCOUNTER — Ambulatory Visit (HOSPITAL_COMMUNITY): Admission: RE | Admit: 2020-01-31 | Payer: BC Managed Care – PPO | Source: Ambulatory Visit

## 2020-02-12 ENCOUNTER — Other Ambulatory Visit (INDEPENDENT_AMBULATORY_CARE_PROVIDER_SITE_OTHER): Payer: Self-pay | Admitting: Internal Medicine

## 2020-02-18 ENCOUNTER — Telehealth (INDEPENDENT_AMBULATORY_CARE_PROVIDER_SITE_OTHER): Payer: Self-pay

## 2020-02-18 NOTE — Telephone Encounter (Signed)
Yes, I explained the process to him. He seems to think he can call or walk in and get orders for things he told about w/o medical visit.  But I will call him again to offer him a appt now.

## 2020-02-18 NOTE — Telephone Encounter (Signed)
Regardless of what the chiropractor note says, before we can order any kind of scan, we will have to see him.  Please make an appointment for him to see either Maralyn Sago or me.

## 2020-02-18 NOTE — Telephone Encounter (Signed)
Pt stopped by office this morning asking if we will do a order for a scan on his T7, T8. Stating the [facility] chiropractor stated he needed to have a scan done on his spine. Because this is what is showing how he is having all these false pains in his rt  Quadrant area. This will help figure out what is needed next.

## 2020-02-18 NOTE — Telephone Encounter (Signed)
Make sure he understands that we cannot order any test without examining him for this particular problem.  I do not see a note that we have seen him for this particular problem.

## 2020-02-19 ENCOUNTER — Ambulatory Visit (INDEPENDENT_AMBULATORY_CARE_PROVIDER_SITE_OTHER): Payer: BC Managed Care – PPO | Admitting: Internal Medicine

## 2020-02-19 ENCOUNTER — Encounter (INDEPENDENT_AMBULATORY_CARE_PROVIDER_SITE_OTHER): Payer: Self-pay | Admitting: Internal Medicine

## 2020-02-19 ENCOUNTER — Other Ambulatory Visit: Payer: Self-pay

## 2020-02-19 VITALS — BP 122/80 | HR 87 | Temp 97.9°F | Resp 18 | Ht 73.0 in | Wt 214.8 lb

## 2020-02-19 DIAGNOSIS — M5414 Radiculopathy, thoracic region: Secondary | ICD-10-CM

## 2020-02-19 NOTE — Progress Notes (Signed)
Metrics: Intervention Frequency ACO  Documented Smoking Status Yearly  Screened one or more times in 24 months  Cessation Counseling or  Active cessation medication Past 24 months  Past 24 months   Guideline developer: UpToDate (See UpToDate for funding source) Date Released: 2014       Wellness Office Visit  Subjective:  Patient ID: Karl Atkins, male    DOB: 06-Jul-1965  Age: 55 y.o. MRN: [id number]  CC: Mid back pain HPI  This man comes in for an acute visit with the above symptom which has been present for approximately 9 weeks.  It was triggered by pulling on a heavy object 9 weeks ago.  He has noticed that he gets a sharp pain in the mid back in the thoracic region radiating anteriorly in what appears to be a dermatomal distribution.  The pain stops him going to sleep and when he does eventually go to sleep, it wakes him up in the middle of night because of the severity of the pain.  He has no problems with gait or weakness in his legs and he does not have any urinary or bowel symptoms. He apparently went to see a chiropractor who was concerned about problems at T7/T8. Past Medical History:  Diagnosis Date  . Diabetic neuropathy ([facility]) 05/16/2016  . Essential hypertension   . Headache    hx migraines mid 1990's  . Hyperlipidemia   . Obesity   . Type 2 diabetes mellitus ([facility]) 2011   Past Surgical History:  Procedure Laterality Date  . BACK SURGERY  2014   lower back  . COLONOSCOPY WITH PROPOFOL N/A 04/19/2016   Procedure: COLONOSCOPY WITH PROPOFOL;  Surgeon: Charolett Bumpers, MD;  Location: [facility];  Service: Endoscopy;  Laterality: N/A;  . MIDDLE EAR SURGERY     x2     Family History  Problem Relation Age of Onset  . Diabetes Father   . Heart failure Father   . Diabetes Mother   . Heart failure Mother   . Hypertension Mother   . Diabetes Sister     Social History   Social History Narrative   Married for 20 years.Lives with wife.Previously Print production planner.Owns car wash.   Social History   Tobacco Use  . Smoking status: Never Smoker  . Smokeless tobacco: Never Used  Substance Use Topics  . Alcohol use: Yes    Comment: occ    Current Meds  Medication Sig  . cetirizine (ZYRTEC) 5 MG tablet Take 5 mg by mouth daily.  . Cholecalciferol (VITAMIN D3) 125 MCG (5000 UT) CAPS Take 2 capsules by mouth daily.  . fenofibrate (TRICOR) 145 MG tablet TAKE ONE TABLET ONCE DAILY  . gabapentin (NEURONTIN) 300 MG capsule Take 300 mg by mouth 3 (three) times daily as needed (for pain/nerve pain.).  Marland Kitchen glyBURIDE-metformin (GLUCOVANCE) 5-500 MG tablet TAKE TWO (2) TABLETS BY MOUTH 2 TIMES DAILY  . levothyroxine (SYNTHROID) 25 MCG tablet Take 25 mcg by mouth daily before breakfast.  . lidocaine (LIDODERM) 5 % Place 1 patch onto the skin daily. Remove & Discard patch within 12 hours or as directed by MD  . lisinopril-hydrochlorothiazide (ZESTORETIC) 20-25 MG tablet TAKE ONE (1) TABLET BY MOUTH EVERY DAY  . melatonin 3 MG TABS tablet Take 1.5 mg by mouth at bedtime.  . meloxicam (MOBIC) 15 MG tablet Take 15 mg by mouth daily.   . Multiple Vitamin (MULTIVITAMIN WITH MINERALS) TABS tablet Take 1 tablet by mouth daily before lunch.  Marland Kitchen  oxyCODONE (ROXICODONE) 5 MG immediate release tablet Take 1 tablet (5 mg total) by mouth every 4 (four) hours as needed for severe pain.  . vitamin C (ASCORBIC ACID) 500 MG tablet Take 500 mg by mouth daily before lunch.  . Zinc Sulfate (ZINC 15 PO) Take by mouth.      Depression screen [facility] 2/9 01/13/2020  Decreased Interest 0  Down, Depressed, Hopeless 0  PHQ - 2 Score 0  Altered sleeping 0  Tired, decreased energy 0  Change in appetite 0  Feeling bad or failure about yourself  0  Trouble concentrating 0  Moving slowly or fidgety/restless 0  Suicidal thoughts 0  PHQ-9 Score 0  Difficult doing work/chores Not difficult at all     Objective:   Today's Vitals: BP 122/80 (BP Location: Left Arm, Patient  Position: Sitting, Cuff Size: Large)   Pulse 87   Temp 97.9 F (36.6 C) (Temporal)   Resp 18   Ht 6\' 1"  (1.854 m)   Wt 214 lb 12.8 oz (97.4 kg)   SpO2 97%   BMI 28.34 kg/m  Vitals with BMI 02/19/2020 01/27/2020 01/27/2020  Height 6\' 1"  - 6\' 1"   Weight 214 lbs 13 oz - 224 lbs 13 oz  BMI 28.35 - 29.67  Systolic 122 148 01/29/2020  Diastolic 80 88 98  Pulse 87 85 102     Physical Exam  He does not appear to be in acute pain.  He does not have bony spinal tenderness but he does have some tenderness around the region of T7/T8 in the paramedian area on the right side.  He is numb in a dermatomal distribution affecting T7/T8.     Assessment   1. Thoracic radiculopathy       Tests ordered Orders Placed This Encounter  Procedures  . MR Thoracic Spine Wo Contrast     Plan: 1. It appears that he may have a thoracic radiculopathy and we will arrange an MRI thoracic spine for further evaluation.  I did tell the patient that it will depend on approval of insurance.  He told me that if insurance does not approve it, he is willing to pay for the scan out-of-pocket.   No orders of the defined types were placed in this encounter.   , MD

## 2020-02-21 ENCOUNTER — Other Ambulatory Visit (INDEPENDENT_AMBULATORY_CARE_PROVIDER_SITE_OTHER): Payer: Self-pay | Admitting: Internal Medicine

## 2020-02-24 ENCOUNTER — Other Ambulatory Visit (INDEPENDENT_AMBULATORY_CARE_PROVIDER_SITE_OTHER): Payer: Self-pay | Admitting: Internal Medicine

## 2020-02-24 DIAGNOSIS — M5414 Radiculopathy, thoracic region: Secondary | ICD-10-CM

## 2020-02-25 ENCOUNTER — Ambulatory Visit
Admission: RE | Admit: 2020-02-25 | Discharge: 2020-02-25 | Disposition: A | Discharge status: Home / Self Care | Payer: BC Managed Care – PPO | Source: Ambulatory Visit | Attending: Internal Medicine | Admitting: Internal Medicine

## 2020-02-25 DIAGNOSIS — M47814 Spondylosis without myelopathy or radiculopathy, thoracic region: Secondary | ICD-10-CM | POA: Diagnosis not present

## 2020-02-25 NOTE — Progress Notes (Signed)
Please call the patient.  Let him know that the MRI of the thoracic spine does not show any major abnormalities except for some mild arthritis.  There was no nerve compression seen or spinal cord problems. Follow-up as scheduled.

## 2020-02-25 NOTE — Progress Notes (Signed)
Pt was called given the results of imaging. Pt was shock & puzzled at the same time stated. Now what could this problem coming form the gallbladder or something else. Pt is aware he has a another image of CT Chest to be done on the 1/17 @ [facility]. This was ordered by Lowry Bowl. Will go to this appointment. Notified him to check his e-mail account of the mychart letter to reset to open account.

## 2020-02-27 ENCOUNTER — Ambulatory Visit (INDEPENDENT_AMBULATORY_CARE_PROVIDER_SITE_OTHER): Payer: BC Managed Care – PPO | Admitting: Nurse Practitioner

## 2020-02-27 ENCOUNTER — Encounter: Payer: Self-pay | Admitting: Nurse Practitioner

## 2020-02-27 ENCOUNTER — Encounter: Payer: Self-pay | Admitting: *Deleted

## 2020-02-27 ENCOUNTER — Other Ambulatory Visit: Payer: Self-pay

## 2020-02-27 ENCOUNTER — Telehealth: Payer: Self-pay

## 2020-02-27 ENCOUNTER — Telehealth: Payer: Self-pay | Admitting: *Deleted

## 2020-02-27 DIAGNOSIS — Z86010 Personal history of colonic polyps: Secondary | ICD-10-CM | POA: Insufficient documentation

## 2020-02-27 DIAGNOSIS — R1011 Right upper quadrant pain: Secondary | ICD-10-CM | POA: Diagnosis not present

## 2020-02-27 DIAGNOSIS — R109 Unspecified abdominal pain: Secondary | ICD-10-CM | POA: Insufficient documentation

## 2020-02-27 NOTE — Telephone Encounter (Signed)
This patient does not require pre authorization through [employer] based on their benefits and/ or the service area of the rendering site.

## 2020-02-27 NOTE — Telephone Encounter (Signed)
Pt aware of OV with EG this afternoon

## 2020-02-27 NOTE — Patient Instructions (Signed)
Your health issues we discussed today were:   History of colon polyps: 1. As we discussed, we requested previous colonoscopy to get scanned in our system 2. It sounds like you will be due for your next colonoscopy in 2 to 3 years, we will know more definitive date when you get your previous report 3. Because we have any obvious rectal bleeding  Right upper quadrant abdominal pain: 1. As we discussed, we will check a HIDA scan for gallbladder function 2. Further recommendations will follow 3. We may eventually refer you to a surgeon for their opinion, especially pending your HIDA scan results 4. Call for any worsening or severe symptoms  Overall I recommend:  1. Continue other current medications 2. Return for follow-up in 2 months 3. Because for any questions or concerns   ---------------------------------------------------------------  I am glad you have gotten your COVID-19 vaccination!  Even though you are fully vaccinated you should continue to follow CDC and state/local guidelines.  ---------------------------------------------------------------   At [facility] we value your feedback. You may receive a survey about your visit today. Please share your experience as we strive to create trusting relationships with our patients to provide genuine, compassionate, quality care.  We appreciate your understanding and patience as we review any laboratory studies, imaging, and other diagnostic tests that are ordered as we care for you. Our office policy is 5 business days for review of these results, and any emergent or urgent results are addressed in a timely manner for your best interest. If you do not hear from our office in 1 week, please contact us.   We also encourage the use of MyChart, which contains your medical information for your review as well. If you are not enrolled in this feature, an access code is on this after visit summary for your convenience. Thank you  for allowing [place] to be involved in your care.  It was great to see you today!  I hope you have a great winter, stay safe this weekend!!

## 2020-02-27 NOTE — Progress Notes (Signed)
Primary Care Physician:  Doree Albee, MD Primary Gastroenterologist:  Dr. Gala Romney  Chief Complaint  Patient presents with  . Abdominal Pain    Started end of 11/2019. Right upper quad into back and chest    HPI:   Karl Atkins is a 55 y.o. male who presents on referral from the emergency department for abdominal pain.  The patient used to see [facility] gastroenterology but prefers not to go back to [place] and would prefer local care.  The patient was seen in the emergency department 01/14/2020 for right upper quadrant abdominal pain.  Pain also located in the epigastrium, ongoing for a week prior to his visit with gradual onset and progressive worsening described as dull.  No fevers, nausea, vomiting, change in stools.  Apparently he had elevated lipase numbers at primary care.  Lipase at primary care was 174.  In the emergency department troponins were negative.  CT of the abdomen and pelvis was completed which found no acute findings, specifically normal pancreas.  Right upper quadrant ultrasound for possible biliary etiology found no abnormality identified.  There was some noted hypersensitivity to the skin in the right upper quadrant region and a possible dermatomal distribution but no rash, felt possibility of shingles.  Other differentials include costochondritis and less likely gastritis/pancreatitis.  His work-up was deemed reassuring and he was discharged home in satisfactory condition.  Today states he is doing okay overall. He states his brother had his gallbladder out and had normal testing until he had an abnormal HIDA scan. His pain is epigastric/RUQ with radiation to his back. Pain occurs daily, not necessarily postprandial. Has been ongoing since October. Was "moving some stuff" a week before his symptoms started and thought it could be musculoskeletal, but it's been ongoing and some worsening. Does have relief intermittently. Denies other lower abdominal pain, GERD symptoms.  Denies N/V, hematochezia, melena, fever, chills. Over the past 6-7 years he has lost about 100 lbs, eating better and has a good appetite. Denies URI or flu-like symptoms. Denies loss of sense of taste or smell. The patient has not received COVID-19 vaccination(s). Denies chest pain, dyspnea, dizziness, lightheadedness, syncope, near syncope. Denies any other upper or lower GI symptoms.  Last colonoscopy was 2-3 years ago at [place], had 2 polyps and recommended repeating 5 years. Will be due in 2024-2025.  Past Medical History:  Diagnosis Date  . Diabetic neuropathy ([place]) 05/16/2016  . Essential hypertension   . Headache    hx migraines mid 1990's  . Hyperlipidemia   . Obesity   . Type 2 diabetes mellitus ([place]) 2011    Past Surgical History:  Procedure Laterality Date  . BACK SURGERY  2014   lower back  . COLONOSCOPY WITH PROPOFOL N/A 04/19/2016   Procedure: COLONOSCOPY WITH PROPOFOL;  Surgeon: Garlan Fair, MD;  Location: [facility];  Service: Endoscopy;  Laterality: N/A;  . MIDDLE EAR SURGERY     x2    Current Outpatient Medications  Medication Sig Dispense Refill  . cetirizine (ZYRTEC) 5 MG tablet Take 5 mg by mouth daily.    . Cholecalciferol (VITAMIN D3) 125 MCG (5000 UT) CAPS Take 2 capsules by mouth daily.    . fenofibrate (TRICOR) 145 MG tablet TAKE ONE TABLET ONCE DAILY 30 tablet 2  . gabapentin (NEURONTIN) 300 MG capsule TAKE ONE TABLET THREE TIMES DAILY (Patient taking differently: Take 300 mg by mouth at bedtime.) 90 capsule 3  . glyBURIDE-metformin (GLUCOVANCE) 5-500 MG tablet  TAKE TWO (2) TABLETS BY MOUTH 2 TIMES DAILY (Patient taking differently: Take 1 tablet by mouth as needed.) 120 tablet 2  . lisinopril-hydrochlorothiazide (ZESTORETIC) 20-25 MG tablet TAKE ONE (1) TABLET BY MOUTH EVERY DAY 90 tablet 0  . melatonin 3 MG TABS tablet Take 30 mg by mouth at bedtime.    . meloxicam (MOBIC) 15 MG tablet TAKE ONE (1) TABLET BY MOUTH EVERY DAY 30 tablet 3  . Multiple  Vitamin (MULTIVITAMIN WITH MINERALS) TABS tablet Take 1 tablet by mouth daily before lunch.    . vitamin C (ASCORBIC ACID) 500 MG tablet Take 500 mg by mouth daily before lunch.    . Zinc Sulfate (ZINC 15 PO) Take by mouth daily.     No current facility-administered medications for this visit.    Allergies as of 02/27/2020 - Review Complete 02/27/2020  Allergen Reaction Noted  . Bee pollen Other (See Comments) 06/03/2010  . Pollen extract-tree extract Other (See Comments) 06/03/2010    Family History  Problem Relation Age of Onset  . Diabetes Father   . Heart failure Father   . Diabetes Mother   . Heart failure Mother   . Hypertension Mother   . Diabetes Sister   . Colon cancer Neg Hx     Social History   Socioeconomic History  . Marital status: Married    Spouse name: Not on file  . Number of children: Not on file  . Years of education: Not on file  . Highest education level: Not on file  Occupational History  . Occupation: Nurse, learning disability  Tobacco Use  . Smoking status: Never Smoker  . Smokeless tobacco: Never Used  Vaping Use  . Vaping Use: Never used  Substance and Sexual Activity  . Alcohol use: Yes    Comment: occ  . Drug use: No  . Sexual activity: Not on file  Other Topics Concern  . Not on file  Social History Narrative   Married for 20 years.Lives with wife.Previously Nurse, learning disability.Owns car wash.   Social Determinants of Health   Financial Resource Strain: Not on file  Food Insecurity: Not on file  Transportation Needs: Not on file  Physical Activity: Not on file  Stress: Not on file  Social Connections: Not on file  Intimate Partner Violence: Not on file    Subjective: Review of Systems  Constitutional: Negative for chills, fever, malaise/fatigue and weight loss.  HENT: Negative for congestion and sore throat.   Respiratory: Negative for cough and shortness of breath.   Cardiovascular: Negative for chest pain and palpitations.   Gastrointestinal: Positive for abdominal pain. Negative for blood in stool, constipation, diarrhea, heartburn, melena, nausea and vomiting.  Musculoskeletal: Negative for joint pain and myalgias.  Skin: Negative for rash.  Neurological: Negative for dizziness and weakness.  Endo/Heme/Allergies: Does not bruise/bleed easily.  Psychiatric/Behavioral: Negative for depression. The patient is not nervous/anxious.   All other systems reviewed and are negative.      Objective: BP (!) 162/96   Pulse 99   Temp (!) 97.3 F (36.3 C) (Temporal)   Ht 6\' 1"  (1.854 m)   Wt 213 lb 12.8 oz (97 kg)   BMI 28.21 kg/m  Physical Exam Vitals and nursing note reviewed.  Constitutional:      General: He is not in acute distress.    Appearance: Normal appearance. He is not ill-appearing, toxic-appearing or diaphoretic.  HENT:     Head: Normocephalic and atraumatic.     Nose: No congestion  or rhinorrhea.  Eyes:     General: No scleral icterus. Cardiovascular:     Rate and Rhythm: Normal rate and regular rhythm.     Heart sounds: Normal heart sounds.  Pulmonary:     Effort: Pulmonary effort is normal.     Breath sounds: Normal breath sounds.  Chest:     Chest wall: No tenderness (left-sided).  Abdominal:     General: Bowel sounds are normal. There is no distension.     Palpations: Abdomen is soft. There is no hepatomegaly, splenomegaly or mass.     Tenderness: There is abdominal tenderness in the right upper quadrant. There is no guarding or rebound.     Hernia: No hernia is present.  Musculoskeletal:     Cervical back: Neck supple.  Skin:    General: Skin is warm and dry.     Coloration: Skin is not jaundiced.     Findings: No bruising or rash.  Neurological:     General: No focal deficit present.     Mental Status: He is alert and oriented to person, place, and time. Mental status is at baseline.  Psychiatric:        Mood and Affect: Mood normal.        Behavior: Behavior normal.         Thought Content: Thought content normal.      Assessment:  Pleasant 55 year old male presents on referral from primary care for bradycardia and abdominal pain.  He was previously evaluated by the emergency department as noted in the HPI.  Abdominal imaging thus far has been negative.  He does have a brother who had to have a cholecystectomy after HIDA scan found biliary dyskinesia.  His symptoms been ongoing since October.  He was helping to "move some stuff" with a friend and thought maybe it was musculoskeletal but it is persistent and bothersome.  When he was lifting stuff he was actually more pulling with his back rather than twisting so I doubt this was because of right upper quadrant/right side pain.  Chest wall/ribs are not really tender to palpation which decreases the possibility of costochondritis or rib fracture/other musculoskeletal etiology.  On exam he did have some pretty good tenderness to the right upper quadrant especially when hooking underneath his ribs, although no true positive Murphy sign.  Denies overt GERD symptoms.  At this point I will evaluate for biliary dyskinesia with a HIDA scan.  We may eventually refer to surgeon for surgical opinion as an outpatient.  Of note, we will also request his previous colonoscopy completed to 3 years ago in order to get it scanned into the system.   Plan: 1. HIDA scan 2. Request previous colonoscopy 3. Call for worsening or severe symptoms 4. Further recommendations to follow 5. Follow-up in 2 months    Thank you for allowing [place] to participate in the care of Celesta Gentile, DNP, AGNP-C Adult & Gerontological Nurse Practitioner [facility]   02/27/2020 3:10 PM   Disclaimer: This note was dictated with voice recognition software. Similar sounding words can inadvertently be transcribed and may not be corrected upon review.

## 2020-02-27 NOTE — Telephone Encounter (Signed)
Pt came to the office today, c/o abd pain. Pt used to see [facility] but prefers not to go back to [place]. Ok to schedule new visit ov.

## 2020-02-28 NOTE — Progress Notes (Signed)
Cc'ed to pcp °

## 2020-03-02 ENCOUNTER — Other Ambulatory Visit: Payer: Self-pay

## 2020-03-02 ENCOUNTER — Encounter (HOSPITAL_COMMUNITY)
Admission: RE | Admit: 2020-03-02 | Discharge: 2020-03-02 | Disposition: A | Discharge status: Home / Self Care | Payer: BC Managed Care – PPO | Source: Ambulatory Visit | Attending: Nurse Practitioner | Admitting: Nurse Practitioner

## 2020-03-02 ENCOUNTER — Ambulatory Visit (HOSPITAL_COMMUNITY): Admission: RE | Admit: 2020-03-02 | Payer: BC Managed Care – PPO | Source: Ambulatory Visit

## 2020-03-02 DIAGNOSIS — Z86010 Personal history of colonic polyps: Secondary | ICD-10-CM | POA: Insufficient documentation

## 2020-03-02 DIAGNOSIS — R1011 Right upper quadrant pain: Secondary | ICD-10-CM | POA: Diagnosis not present

## 2020-03-02 MED ORDER — TECHNETIUM TC 99M MEBROFENIN IV KIT
5.0000 | PACK | Freq: Once | INTRAVENOUS | Status: AC | PRN
  Administered 2020-03-02: 4.89 via INTRAVENOUS

## 2020-03-03 ENCOUNTER — Other Ambulatory Visit (HOSPITAL_COMMUNITY): Payer: BC Managed Care – PPO

## 2020-03-04 ENCOUNTER — Other Ambulatory Visit: Payer: Self-pay | Admitting: *Deleted

## 2020-03-04 ENCOUNTER — Telehealth: Payer: Self-pay | Admitting: Internal Medicine

## 2020-03-04 DIAGNOSIS — R1011 Right upper quadrant pain: Secondary | ICD-10-CM

## 2020-03-04 NOTE — Telephone Encounter (Signed)
Please call patient regarding his test results he had done on Monday. [phone]

## 2020-03-04 NOTE — Telephone Encounter (Signed)
Noted. Will discuss result note with pt. See results note.

## 2020-03-06 ENCOUNTER — Telehealth: Payer: Self-pay | Admitting: Family Medicine

## 2020-03-06 NOTE — Telephone Encounter (Signed)
Called pt in regards to referral - note in chart for Hida scan states he only wanted to see Dr. Constance Haw. Dr. Constance Haw was unable to see pt within the time frame pt thought was suitable for his condition. During our conservation he states that this has been going on since October and he is in a 7 out of 10 pain and needs it to be taken out this week, that he guesses he will just have to lay around and die. I apologized to pt that she does not have anything sooner. Pt was very short and rude with me on phone. I called Dr. Arnoldo Morale and he was agreeable to see patient Tuesday 03/10/2020. Called pt back to inform of appt and time and to inform if he gets worse to go to the ER. Pt then states that he has been there, done that and they didn't do a damn thing when he was there before. He did accept the apt for Tuesday and states the will just have to lay around and do nothing until then. Thanked me for the call back and conservation was ended.

## 2020-03-10 ENCOUNTER — Encounter: Payer: Self-pay | Admitting: General Surgery

## 2020-03-10 ENCOUNTER — Ambulatory Visit (INDEPENDENT_AMBULATORY_CARE_PROVIDER_SITE_OTHER): Payer: BC Managed Care – PPO | Admitting: General Surgery

## 2020-03-10 ENCOUNTER — Other Ambulatory Visit: Payer: Self-pay

## 2020-03-10 VITALS — BP 171/91 | HR 88 | Temp 98.0°F | Resp 12 | Ht 73.0 in | Wt 216.0 lb

## 2020-03-10 DIAGNOSIS — K811 Chronic cholecystitis: Secondary | ICD-10-CM

## 2020-03-10 NOTE — Progress Notes (Signed)
Karl Atkins; [id number]; 02-03-1966   HPI Patient is a 55 year old white male who was referred to my care by Dr. Anastasio Atkins and gastroenterology for evaluation and treatment of chronic cholecystitis.  Patient has had intermittent episodes of right flank pain, right shoulder pain, and right upper quadrant abdominal pain intermittently for the past 4 months.  It does not seem to be associated with food.  He had an ultrasound of the gallbladder done which was negative for cholelithiasis.  He recently had a HIDA scan which revealed a borderline gallbladder ejection fraction.  He did not necessarily have reproducible symptoms.  He does intermittently have nausea and bloating.  He denies any fever, chills, jaundice.  The symptoms are sporadic and intermittent in nature.  They seem to be worsening in intensity.  He has had an MRI of his back which was unremarkable. Past Medical History:  Diagnosis Date  . Diabetic neuropathy ([place]) 05/16/2016  . Essential hypertension   . Headache    hx migraines mid 1990's  . Hyperlipidemia   . Obesity   . Type 2 diabetes mellitus ([place]) 2011    Past Surgical History:  Procedure Laterality Date  . BACK SURGERY  2014   lower back  . COLONOSCOPY WITH PROPOFOL N/A 04/19/2016   Procedure: COLONOSCOPY WITH PROPOFOL;  Surgeon: Karl Fair, MD;  [facility];  Service: Endoscopy;  Laterality: N/A;  . MIDDLE EAR SURGERY     x2    Family History  Problem Relation Age of Onset  . Diabetes Father   . Heart failure Father   . Diabetes Mother   . Heart failure Mother   . Hypertension Mother   . Diabetes Sister   . Colon cancer Neg Hx     Current Outpatient Medications on File Prior to Visit  Medication Sig Dispense Refill  . cetirizine (ZYRTEC) 5 MG tablet Take 5 mg by mouth daily.    . Cholecalciferol (VITAMIN D3) 125 MCG (5000 UT) CAPS Take 2 capsules by mouth daily.    . fenofibrate (TRICOR) 145 MG tablet TAKE ONE TABLET ONCE DAILY 30 tablet 2  .  gabapentin (NEURONTIN) 300 MG capsule TAKE ONE TABLET THREE TIMES DAILY (Patient taking differently: Take 300 mg by mouth at bedtime.) 90 capsule 3  . glyBURIDE-metformin (GLUCOVANCE) 5-500 MG tablet TAKE TWO (2) TABLETS BY MOUTH 2 TIMES DAILY (Patient taking differently: Take 1 tablet by mouth as needed.) 120 tablet 2  . lisinopril-hydrochlorothiazide (ZESTORETIC) 20-25 MG tablet TAKE ONE (1) TABLET BY MOUTH EVERY DAY 90 tablet 0  . melatonin 3 MG TABS tablet Take 30 mg by mouth at bedtime.    . meloxicam (MOBIC) 15 MG tablet TAKE ONE (1) TABLET BY MOUTH EVERY DAY 30 tablet 3  . Multiple Vitamin (MULTIVITAMIN WITH MINERALS) TABS tablet Take 1 tablet by mouth daily before lunch.    . vitamin C (ASCORBIC ACID) 500 MG tablet Take 500 mg by mouth daily before lunch.    . Zinc Sulfate (ZINC 15 PO) Take by mouth daily.     No current facility-administered medications on file prior to visit.    Allergies  Allergen Reactions  . Bee Pollen Other (See Comments)    Itchy eyes/runny nose  . Pollen Extract-Tree Extract Other (See Comments)    Itchy eyes/runny nose    Social History   Substance and Sexual Activity  Alcohol Use Yes   Comment: occ    Social History   Tobacco Use  Smoking Status Never Smoker  Smokeless Tobacco Never Used    Review of Systems  Constitutional: Negative.   HENT: Negative.   Eyes: Negative.   Respiratory: Negative.   Cardiovascular: Negative.   Gastrointestinal: Negative.   Genitourinary: Negative.   Musculoskeletal: Negative.   Skin: Negative.   Neurological: Negative.   Endo/Heme/Allergies: Negative.   Psychiatric/Behavioral: Negative.     Objective   Vitals:   03/10/20 1412  BP: (!) 171/91  Pulse: 88  Resp: 12  Temp: 98 F (36.7 C)  SpO2: 97%    Physical Exam Vitals reviewed.  Constitutional:      Appearance: Normal appearance. He is not ill-appearing.  HENT:     Head: Normocephalic and atraumatic.  Eyes:     General: No scleral  icterus. Cardiovascular:     Rate and Rhythm: Normal rate and regular rhythm.     Heart sounds: Normal heart sounds. No murmur heard. No friction rub. No gallop.   Pulmonary:     Effort: Pulmonary effort is normal. No respiratory distress.     Breath sounds: Normal breath sounds. No stridor. No wheezing, rhonchi or rales.  Abdominal:     General: There is no distension.     Palpations: Abdomen is soft. There is no mass.     Tenderness: There is no abdominal tenderness. There is no guarding or rebound.     Hernia: No hernia is present.     Comments: Slight discomfort to deep palpation in the right upper quadrant.  Skin:    General: Skin is warm and dry.  Neurological:     Mental Status: He is alert and oriented to person, place, and time.   GI notes, labs reviewed  Assessment  Chronic cholecystitis, though some of his symptoms are atypical Plan   As no other source of his pain can be identified, will proceed with laparoscopic cholecystectomy on 03/13/2020.  The risks and benefits of the procedure including bleeding, infection, hepatobiliary injury, the possibility of recurrence of the symptoms, and the possibility of an open procedure were fully explained to the patient, who gave informed consent.

## 2020-03-10 NOTE — H&P (Signed)
Karl Atkins; [id number]; 02/17/1965   HPI Patient is a 54-year-old white male who was referred to my care by Dr. Gosrani and gastroenterology for evaluation and treatment of chronic cholecystitis.  Patient has had intermittent episodes of right flank pain, right shoulder pain, and right upper quadrant abdominal pain intermittently for the past 4 months.  It does not seem to be associated with food.  He had an ultrasound of the gallbladder done which was negative for cholelithiasis.  He recently had a HIDA scan which revealed a borderline gallbladder ejection fraction.  He did not necessarily have reproducible symptoms.  He does intermittently have nausea and bloating.  He denies any fever, chills, jaundice.  The symptoms are sporadic and intermittent in nature.  They seem to be worsening in intensity.  He has had an MRI of his back which was unremarkable. Past Medical History:  Diagnosis Date  . Diabetic neuropathy ([facility]) 05/16/2016  . Essential hypertension   . Headache    hx migraines mid 1990's  . Hyperlipidemia   . Obesity   . Type 2 diabetes mellitus ([facility]) 2011    Past Surgical History:  Procedure Laterality Date  . BACK SURGERY  2014   lower back  . COLONOSCOPY WITH PROPOFOL N/A 04/19/2016   Procedure: COLONOSCOPY WITH PROPOFOL;  Surgeon: Martin K Johnson, MD;  [facility];  Service: Endoscopy;  Laterality: N/A;  . MIDDLE EAR SURGERY     x2    Family History  Problem Relation Age of Onset  . Diabetes Father   . Heart failure Father   . Diabetes Mother   . Heart failure Mother   . Hypertension Mother   . Diabetes Sister   . Colon cancer Neg Hx     Current Outpatient Medications on File Prior to Visit  Medication Sig Dispense Refill  . cetirizine (ZYRTEC) 5 MG tablet Take 5 mg by mouth daily.    . Cholecalciferol (VITAMIN D3) 125 MCG (5000 UT) CAPS Take 2 capsules by mouth daily.    . fenofibrate (TRICOR) 145 MG tablet TAKE ONE TABLET ONCE DAILY 30 tablet 2  .  gabapentin (NEURONTIN) 300 MG capsule TAKE ONE TABLET THREE TIMES DAILY (Patient taking differently: Take 300 mg by mouth at bedtime.) 90 capsule 3  . glyBURIDE-metformin (GLUCOVANCE) 5-500 MG tablet TAKE TWO (2) TABLETS BY MOUTH 2 TIMES DAILY (Patient taking differently: Take 1 tablet by mouth as needed.) 120 tablet 2  . lisinopril-hydrochlorothiazide (ZESTORETIC) 20-25 MG tablet TAKE ONE (1) TABLET BY MOUTH EVERY DAY 90 tablet 0  . melatonin 3 MG TABS tablet Take 30 mg by mouth at bedtime.    . meloxicam (MOBIC) 15 MG tablet TAKE ONE (1) TABLET BY MOUTH EVERY DAY 30 tablet 3  . Multiple Vitamin (MULTIVITAMIN WITH MINERALS) TABS tablet Take 1 tablet by mouth daily before lunch.    . vitamin C (ASCORBIC ACID) 500 MG tablet Take 500 mg by mouth daily before lunch.    . Zinc Sulfate (ZINC 15 PO) Take by mouth daily.     No current facility-administered medications on file prior to visit.    Allergies  Allergen Reactions  . Bee Pollen Other (See Comments)    Itchy eyes/runny nose  . Pollen Extract-Tree Extract Other (See Comments)    Itchy eyes/runny nose    Social History   Substance and Sexual Activity  Alcohol Use Yes   Comment: occ    Social History   Tobacco Use  Smoking Status Never Smoker    Smokeless Tobacco Never Used    Review of Systems  Constitutional: Negative.   HENT: Negative.   Eyes: Negative.   Respiratory: Negative.   Cardiovascular: Negative.   Gastrointestinal: Negative.   Genitourinary: Negative.   Musculoskeletal: Negative.   Skin: Negative.   Neurological: Negative.   Endo/Heme/Allergies: Negative.   Psychiatric/Behavioral: Negative.     Objective   Vitals:   03/10/20 1412  BP: (!) 171/91  Pulse: 88  Resp: 12  Temp: 98 F (36.7 C)  SpO2: 97%    Physical Exam Vitals reviewed.  Constitutional:      Appearance: Normal appearance. He is not ill-appearing.  HENT:     Head: Normocephalic and atraumatic.  Eyes:     General: No scleral  icterus. Cardiovascular:     Rate and Rhythm: Normal rate and regular rhythm.     Heart sounds: Normal heart sounds. No murmur heard. No friction rub. No gallop.   Pulmonary:     Effort: Pulmonary effort is normal. No respiratory distress.     Breath sounds: Normal breath sounds. No stridor. No wheezing, rhonchi or rales.  Abdominal:     General: There is no distension.     Palpations: Abdomen is soft. There is no mass.     Tenderness: There is no abdominal tenderness. There is no guarding or rebound.     Hernia: No hernia is present.     Comments: Slight discomfort to deep palpation in the right upper quadrant.  Skin:    General: Skin is warm and dry.  Neurological:     Mental Status: He is alert and oriented to person, place, and time.   GI notes, labs reviewed  Assessment  Chronic cholecystitis, though some of his symptoms are atypical Plan   As no other source of his pain can be identified, will proceed with laparoscopic cholecystectomy on 03/13/2020.  The risks and benefits of the procedure including bleeding, infection, hepatobiliary injury, the possibility of recurrence of the symptoms, and the possibility of an open procedure were fully explained to the patient, who gave informed consent. 

## 2020-03-10 NOTE — Patient Instructions (Signed)
Karl Atkins  03/10/2020     @PREFPERIOPPHARMACY @   Your procedure is scheduled on  03/13/2020.   Report to [email or web address]   Call this number if you have problems the morning of surgery:  [phone]   Remember:  Do not eat or drink after midnight.                       Take these medicines the morning of surgery with A SIP OF WATER               Zyrtec, mobic (if needed).    Please brush your teeth.  Do not wear jewelry, make-up or nail polish.  Do not wear lotions, powders, or perfumes, or deodorant.  Do not shave 48 hours prior to surgery.  Men may shave face and neck.  Do not bring valuables to the hospital.  [facility] is not responsible for any belongings or valuables.  Contacts, dentures or bridgework may not be worn into surgery.  Leave your suitcase in the car.  After surgery it may be brought to your room.  For patients admitted to the hospital, discharge time will be determined by your treatment team.  Patients discharged the day of surgery will not be allowed to drive home and need someone with them for 24 hours.   Special instructions:   DO NOT smoke (tobacco or vape) the morning of your procedure.   Shower the night before and the morning of your procedure with CHG. DO NOT put CHG on your face, hair or genitals. Use your regular soap on these areas.   After your night time shower, dry off with a clean towel, put on clean clothes to sleep in and place clean sheets on your bed before you sleep this night.  After your morning shower, dry off with a clean towel and put on clean, comfortable clothes to wear to the hospital and brush your teeth.   Please read over the following fact sheets that you were given. Anesthesia Post-op Instructions and Care and Recovery After Surgery       Minimally Invasive Cholecystectomy Minimally invasive cholecystectomy is surgery to remove the gallbladder. The gallbladder is a pear-shaped organ  that lies beneath the liver on the right side of the body. The gallbladder stores bile, which is a fluid that helps the body digest fats. Cholecystectomy is often done to treat inflammation of the gallbladder (cholecystitis). This condition is usually caused by a buildup of gallstones (cholelithiasis) in the gallbladder. Gallstones can block the flow of bile, which can result in inflammation and pain. In severe cases, emergency surgery may be required. This procedure is done though small incisions in the abdomen, instead of one large incision. It is also called laparoscopic surgery. A thin scope with a camera (laparoscope) is inserted through one incision. Then surgical instruments are inserted through the other incisions. In some cases, a minimally invasive surgery may need to be changed to a surgery that is done through a larger incision. This is called open surgery. Tell a health care provider about:  Any allergies you have.  All medicines you are taking, including vitamins, herbs, eye drops, creams, and over-the-counter medicines.  Any problems you or family members have had with anesthetic medicines.  Any blood disorders you have.  Any surgeries you have had.  Any medical conditions you have.  Whether you are pregnant or may  be pregnant. What are the risks? Generally, this is a safe procedure. However, problems may occur, including:  Infection.  Bleeding.  Allergic reactions to medicines.  Damage to nearby structures or organs.  A stone remaining in the common bile duct. The common bile duct carries bile from the gallbladder into the small intestine.  A bile leak from the cyst duct that is clipped when your gallbladder is removed. What happens before the procedure? Staying hydrated Follow instructions from your health care provider about hydration, which may include:  Up to 2 hours before the procedure - you may continue to drink clear liquids, such as water, clear fruit  juice, black coffee, and plain tea.   Eating and drinking restrictions Follow instructions from your health care provider about eating and drinking, which may include:  8 hours before the procedure - stop eating heavy meals or foods, such as meat, fried foods, or fatty foods.  6 hours before the procedure - stop eating light meals or foods, such as toast or cereal.  6 hours before the procedure - stop drinking milk or drinks that contain milk.  2 hours before the procedure - stop drinking clear liquids. Medicines Ask your health care provider about:  Changing or stopping your regular medicines. This is especially important if you are taking diabetes medicines or blood thinners.  Taking medicines such as aspirin and ibuprofen. These medicines can thin your blood. Do not take these medicines unless your health care provider tells you to take them.  Taking over-the-counter medicines, vitamins, herbs, and supplements. General instructions  Let your health care provider know if you develop a cold or an infection before surgery.  Plan to have someone take you home from the hospital or clinic.  If you will be going home right after the procedure, plan to have someone with you for 24 hours.  Ask your health care provider: ? How your surgery site will be marked. ? What steps will be taken to help prevent infection. These may include:  Removing hair at the surgery site.  Washing skin with a germ-killing soap.  Taking antibiotic medicine. What happens during the procedure?  An IV will be inserted into one of your veins.  You will be given one or both of the following: ? A medicine to help you relax (sedative). ? A medicine to make you fall asleep (general anesthetic).  A breathing tube will be placed in your mouth.  Your surgeon will make several small incisions in your abdomen.  The laparoscope will be inserted through one of the small incisions. The camera on the laparoscope  will send images to a monitor in the operating room. This lets your surgeon see inside your abdomen.  A gas will be pumped into your abdomen. This will expand your abdomen to give the surgeon more room to perform the surgery.  Other tools that are needed for the procedure will be inserted through the other incisions. The gallbladder will be removed through one of the incisions.  Your common bile duct may be examined. If stones are found in the common bile duct, they may be removed.  After your gallbladder has been removed, the incisions will be closed with stitches (sutures), staples, or skin glue.  Your incisions may be covered with a bandage (dressing). The procedure may vary among health care providers and hospitals.   What happens after the procedure?  Your blood pressure, heart rate, breathing rate, and blood oxygen level will be monitored until you  leave the hospital or clinic.  You will be given medicines as needed to control your pain.  If you were given a sedative during the procedure, it can affect you for several hours. Do not drive or operate machinery until your health care provider says that it is safe. Summary  Minimally invasive cholecystectomy, also called laparoscopic cholecystectomy, is surgery to remove the gallbladder using small incisions.  Tell your health care provider about all the medical conditions you have and all the medicines you are taking for those conditions.  Before the procedure, follow instructions about eating or drinking restrictions and changing or stopping medicines.  If you were given a sedative during the procedure, it can affect you for several hours. Do not drive or operate machinery until your health care provider says that it is safe. This information is not intended to replace advice given to you by your health care provider. Make sure you discuss any questions you have with your health care provider. Document Revised: 11/05/2018 Document  Reviewed: 11/05/2018 Elsevier Patient Education  2021 [employer]  General Anesthesia, Adult, Care After This sheet gives you information about how to care for yourself after your procedure. Your health care provider may also give you more specific instructions. If you have problems or questions, contact your health care provider. What can I expect after the procedure? After the procedure, the following side effects are common:  Pain or discomfort at the IV site.  Nausea.  Vomiting.  Sore throat.  Trouble concentrating.  Feeling cold or chills.  Feeling weak or tired.  Sleepiness and fatigue.  Soreness and body aches. These side effects can affect parts of the body that were not involved in surgery. Follow these instructions at home: For the time period you were told by your health care provider:  Rest.  Do not participate in activities where you could fall or become injured.  Do not drive or use machinery.  Do not drink alcohol.  Do not take sleeping pills or medicines that cause drowsiness.  Do not make important decisions or sign legal documents.  Do not take care of children on your own.   Eating and drinking  Follow any instructions from your health care provider about eating or drinking restrictions.  When you feel hungry, start by eating small amounts of foods that are soft and easy to digest (bland), such as toast. Gradually return to your regular diet.  Drink enough fluid to keep your urine pale yellow.  If you vomit, rehydrate by drinking water, juice, or clear broth. General instructions  If you have sleep apnea, surgery and certain medicines can increase your risk for breathing problems. Follow instructions from your health care provider about wearing your sleep device: ? Anytime you are sleeping, including during daytime naps. ? While taking prescription pain medicines, sleeping medicines, or medicines that make you drowsy.  Have a responsible  adult stay with you for the time you are told. It is important to have someone help care for you until you are awake and alert.  Return to your normal activities as told by your health care provider. Ask your health care provider what activities are safe for you.  Take over-the-counter and prescription medicines only as told by your health care provider.  If you smoke, do not smoke without supervision.  Keep all follow-up visits as told by your health care provider. This is important. Contact a health care provider if:  You have nausea or vomiting that does not  get better with medicine.  You cannot eat or drink without vomiting.  You have pain that does not get better with medicine.  You are unable to pass urine.  You develop a skin rash.  You have a fever.  You have redness around your IV site that gets worse. Get help right away if:  You have difficulty breathing.  You have chest pain.  You have blood in your urine or stool, or you vomit blood. Summary  After the procedure, it is common to have a sore throat or nausea. It is also common to feel tired.  Have a responsible adult stay with you for the time you are told. It is important to have someone help care for you until you are awake and alert.  When you feel hungry, start by eating small amounts of foods that are soft and easy to digest (bland), such as toast. Gradually return to your regular diet.  Drink enough fluid to keep your urine pale yellow.  Return to your normal activities as told by your health care provider. Ask your health care provider what activities are safe for you. This information is not intended to replace advice given to you by your health care provider. Make sure you discuss any questions you have with your health care provider. Document Revised: 10/17/2019 Document Reviewed: 05/16/2019 Elsevier Patient Education  2021 [employer].

## 2020-03-10 NOTE — Patient Instructions (Addendum)
Minimally Invasive Cholecystectomy Minimally invasive cholecystectomy is surgery to remove the gallbladder. The gallbladder is a pear-shaped organ that lies beneath the liver on the right side of the body. The gallbladder stores bile, which is a fluid that helps the body digest fats. Cholecystectomy is often done to treat inflammation of the gallbladder (cholecystitis). This condition is usually caused by a buildup of gallstones (cholelithiasis) in the gallbladder. Gallstones can block the flow of bile, which can result in inflammation and pain. In severe cases, emergency surgery may be required. This procedure is done though small incisions in the abdomen, instead of one large incision. It is also called laparoscopic surgery. A thin scope with a camera (laparoscope) is inserted through one incision. Then surgical instruments are inserted through the other incisions. In some cases, a minimally invasive surgery may need to be changed to a surgery that is done through a larger incision. This is called open surgery. Tell a health care provider about:  Any allergies you have.  All medicines you are taking, including vitamins, herbs, eye drops, creams, and over-the-counter medicines.  Any problems you or family members have had with anesthetic medicines.  Any blood disorders you have.  Any surgeries you have had.  Any medical conditions you have.  Whether you are pregnant or may be pregnant. What are the risks? Generally, this is a safe procedure. However, problems may occur, including:  Infection.  Bleeding.  Allergic reactions to medicines.  Damage to nearby structures or organs.  A stone remaining in the common bile duct. The common bile duct carries bile from the gallbladder into the small intestine.  A bile leak from the cyst duct that is clipped when your gallbladder is removed. What happens before the procedure? Medicines Ask your health care provider about:  Changing or  stopping your regular medicines. This is especially important if you are taking diabetes medicines or blood thinners.  Taking medicines such as aspirin and ibuprofen. These medicines can thin your blood. Do not take these medicines unless your health care provider tells you to take them.  Taking over-the-counter medicines, vitamins, herbs, and supplements. General instructions  Let your health care provider know if you develop a cold or an infection before surgery.  Plan to have someone take you home from the hospital or clinic.  If you will be going home right after the procedure, plan to have someone with you for 24 hours.  Ask your health care provider: ? How your surgery site will be marked. ? What steps will be taken to help prevent infection. These may include:  Removing hair at the surgery site.  Washing skin with a germ-killing soap.  Taking antibiotic medicine. What happens during the procedure?  An IV will be inserted into one of your veins.  You will be given one or both of the following: ? A medicine to help you relax (sedative). ? A medicine to make you fall asleep (general anesthetic).  A breathing tube will be placed in your mouth.  Your surgeon will make several small incisions in your abdomen.  The laparoscope will be inserted through one of the small incisions. The camera on the laparoscope will send images to a monitor in the operating room. This lets your surgeon see inside your abdomen.  A gas will be pumped into your abdomen. This will expand your abdomen to give the surgeon more room to perform the surgery.  Other tools that are needed for the procedure will be inserted through the   other incisions. The gallbladder will be removed through one of the incisions.  Your common bile duct may be examined. If stones are found in the common bile duct, they may be removed.  After your gallbladder has been removed, the incisions will be closed with stitches  (sutures), staples, or skin glue.  Your incisions may be covered with a bandage (dressing). The procedure may vary among health care providers and hospitals.   What happens after the procedure?  Your blood pressure, heart rate, breathing rate, and blood oxygen level will be monitored until you leave the hospital or clinic.  You will be given medicines as needed to control your pain.  If you were given a sedative during the procedure, it can affect you for several hours. Do not drive or operate machinery until your health care provider says that it is safe. Summary  Minimally invasive cholecystectomy, also called laparoscopic cholecystectomy, is surgery to remove the gallbladder using small incisions.  Tell your health care provider about all the medical conditions you have and all the medicines you are taking for those conditions.  Before the procedure, follow instructions about eating or drinking restrictions and changing or stopping medicines.  If you were given a sedative during the procedure, it can affect you for several hours. Do not drive or operate machinery until your health care provider says that it is safe. This information is not intended to replace advice given to you by your health care provider. Make sure you discuss any questions you have with your health care provider. Document Revised: 11/05/2018 Document Reviewed: 11/05/2018 Elsevier Patient Education  2021 Elsevier Inc.  

## 2020-03-11 ENCOUNTER — Encounter (HOSPITAL_COMMUNITY): Payer: Self-pay

## 2020-03-11 ENCOUNTER — Encounter (HOSPITAL_COMMUNITY)
Admission: RE | Admit: 2020-03-11 | Discharge: 2020-03-11 | Disposition: A | Discharge status: Home / Self Care | Payer: BC Managed Care – PPO | Source: Ambulatory Visit | Attending: General Surgery | Admitting: General Surgery

## 2020-03-11 ENCOUNTER — Other Ambulatory Visit (HOSPITAL_COMMUNITY)
Admission: RE | Admit: 2020-03-11 | Discharge: 2020-03-11 | Disposition: A | Discharge status: Home / Self Care | Payer: BC Managed Care – PPO | Source: Ambulatory Visit | Attending: General Surgery | Admitting: General Surgery

## 2020-03-11 ENCOUNTER — Other Ambulatory Visit: Payer: Self-pay

## 2020-03-11 ENCOUNTER — Ambulatory Visit (HOSPITAL_COMMUNITY): Payer: BC Managed Care – PPO

## 2020-03-11 DIAGNOSIS — Z791 Long term (current) use of non-steroidal anti-inflammatories (NSAID): Secondary | ICD-10-CM | POA: Diagnosis not present

## 2020-03-11 DIAGNOSIS — Z79899 Other long term (current) drug therapy: Secondary | ICD-10-CM | POA: Diagnosis not present

## 2020-03-11 DIAGNOSIS — Z01812 Encounter for preprocedural laboratory examination: Secondary | ICD-10-CM | POA: Insufficient documentation

## 2020-03-11 DIAGNOSIS — Z7984 Long term (current) use of oral hypoglycemic drugs: Secondary | ICD-10-CM | POA: Diagnosis not present

## 2020-03-11 DIAGNOSIS — K811 Chronic cholecystitis: Secondary | ICD-10-CM | POA: Diagnosis not present

## 2020-03-11 DIAGNOSIS — Z20822 Contact with and (suspected) exposure to covid-19: Secondary | ICD-10-CM | POA: Insufficient documentation

## 2020-03-11 LAB — CBC WITH DIFFERENTIAL/PLATELET
Abs Immature Granulocytes: 0.02 10*3/uL (ref 0.00–0.07)
Basophils Absolute: 0 10*3/uL (ref 0.0–0.1)
Basophils Relative: 0 %
Eosinophils Absolute: 0.3 10*3/uL (ref 0.0–0.5)
Eosinophils Relative: 7 %
HCT: 41.9 % (ref 39.0–52.0)
Hemoglobin: 14.6 g/dL (ref 13.0–17.0)
Immature Granulocytes: 0 %
Lymphocytes Relative: 20 %
Lymphs Abs: 0.9 10*3/uL (ref 0.7–4.0)
MCH: 30.7 pg (ref 26.0–34.0)
MCHC: 34.8 g/dL (ref 30.0–36.0)
MCV: 88 fL (ref 80.0–100.0)
Monocytes Absolute: 0.5 10*3/uL (ref 0.1–1.0)
Monocytes Relative: 10 %
Neutro Abs: 2.8 10*3/uL (ref 1.7–7.7)
Neutrophils Relative %: 63 %
Platelets: 215 10*3/uL (ref 150–400)
RBC: 4.76 MIL/uL (ref 4.22–5.81)
RDW: 12.9 % (ref 11.5–15.5)
WBC: 4.5 10*3/uL (ref 4.0–10.5)
nRBC: 0 % (ref 0.0–0.2)

## 2020-03-11 LAB — COMPREHENSIVE METABOLIC PANEL
ALT: 16 U/L (ref 0–44)
AST: 23 U/L (ref 15–41)
Albumin: 4.3 g/dL (ref 3.5–5.0)
Alkaline Phosphatase: 25 U/L — ABNORMAL LOW (ref 38–126)
Anion gap: 8 (ref 5–15)
BUN: 27 mg/dL — ABNORMAL HIGH (ref 6–20)
CO2: 27 mmol/L (ref 22–32)
Calcium: 10.1 mg/dL (ref 8.9–10.3)
Chloride: 103 mmol/L (ref 98–111)
Creatinine, Ser: 1.32 mg/dL — ABNORMAL HIGH (ref 0.61–1.24)
GFR, Estimated: >60 mL/min (ref >60)
Glucose, Bld: 202 mg/dL — ABNORMAL HIGH (ref 70–99)
Potassium: 4.2 mmol/L (ref 3.5–5.1)
Sodium: 138 mmol/L (ref 135–145)
Total Bilirubin: 1.1 mg/dL (ref 0.3–1.2)
Total Protein: 6.8 g/dL (ref 6.5–8.1)

## 2020-03-11 LAB — SARS CORONAVIRUS 2 (TAT 6-24 HRS): SARS Coronavirus 2: NEGATIVE

## 2020-03-13 ENCOUNTER — Encounter (HOSPITAL_COMMUNITY): Payer: Self-pay | Admitting: General Surgery

## 2020-03-13 ENCOUNTER — Ambulatory Visit (HOSPITAL_COMMUNITY): Payer: BC Managed Care – PPO | Admitting: Certified Registered Nurse Anesthetist

## 2020-03-13 ENCOUNTER — Encounter (HOSPITAL_COMMUNITY)
Admission: RE | Disposition: A | Discharge status: Home / Self Care | Payer: Self-pay | Source: Home / Self Care | Attending: General Surgery

## 2020-03-13 ENCOUNTER — Ambulatory Visit (HOSPITAL_COMMUNITY)
Admission: RE | Admit: 2020-03-13 | Discharge: 2020-03-13 | Disposition: A | Discharge status: Home / Self Care | Payer: BC Managed Care – PPO | Attending: General Surgery | Admitting: General Surgery

## 2020-03-13 DIAGNOSIS — I10 Essential (primary) hypertension: Secondary | ICD-10-CM | POA: Diagnosis not present

## 2020-03-13 DIAGNOSIS — E119 Type 2 diabetes mellitus without complications: Secondary | ICD-10-CM | POA: Diagnosis not present

## 2020-03-13 DIAGNOSIS — Z7984 Long term (current) use of oral hypoglycemic drugs: Secondary | ICD-10-CM | POA: Insufficient documentation

## 2020-03-13 DIAGNOSIS — Z79899 Other long term (current) drug therapy: Secondary | ICD-10-CM | POA: Insufficient documentation

## 2020-03-13 DIAGNOSIS — K811 Chronic cholecystitis: Secondary | ICD-10-CM | POA: Insufficient documentation

## 2020-03-13 DIAGNOSIS — Z20822 Contact with and (suspected) exposure to covid-19: Secondary | ICD-10-CM | POA: Diagnosis not present

## 2020-03-13 DIAGNOSIS — Z791 Long term (current) use of non-steroidal anti-inflammatories (NSAID): Secondary | ICD-10-CM | POA: Insufficient documentation

## 2020-03-13 HISTORY — PX: CHOLECYSTECTOMY: SHX55

## 2020-03-13 LAB — GLUCOSE, CAPILLARY
Glucose-Capillary: 115 mg/dL — ABNORMAL HIGH (ref 70–99)
Glucose-Capillary: 182 mg/dL — ABNORMAL HIGH (ref 70–99)

## 2020-03-13 SURGERY — LAPAROSCOPIC CHOLECYSTECTOMY
Anesthesia: General | Site: Abdomen

## 2020-03-13 MED ORDER — KETOROLAC TROMETHAMINE 30 MG/ML IJ SOLN
30.0000 mg | Freq: Once | INTRAMUSCULAR | Status: AC
  Administered 2020-03-13: 30 mg via INTRAVENOUS
  Filled 2020-03-13: qty 1

## 2020-03-13 MED ORDER — LIDOCAINE HCL (CARDIAC) PF 100 MG/5ML IV SOSY
PREFILLED_SYRINGE | INTRAVENOUS | Status: DC | PRN
  Administered 2020-03-13: 100 mg via INTRATRACHEAL

## 2020-03-13 MED ORDER — CHLORHEXIDINE GLUCONATE CLOTH 2 % EX PADS: 6.0000 | MEDICATED_PAD | Freq: Once | CUTANEOUS | Status: DC

## 2020-03-13 MED ORDER — HYDRALAZINE HCL 20 MG/ML IJ SOLN
INTRAMUSCULAR | Status: DC | PRN
  Administered 2020-03-13: 5 mg via INTRAVENOUS

## 2020-03-13 MED ORDER — LIDOCAINE HCL (PF) 2 % IJ SOLN
INTRAMUSCULAR | Status: AC
  Filled 2020-03-13: qty 5

## 2020-03-13 MED ORDER — PROPOFOL 10 MG/ML IV BOLUS
INTRAVENOUS | Status: AC
  Filled 2020-03-13: qty 20

## 2020-03-13 MED ORDER — BUPIVACAINE LIPOSOME 1.3 % IJ SUSP
INTRAMUSCULAR | Status: AC
  Filled 2020-03-13: qty 20

## 2020-03-13 MED ORDER — PHENYLEPHRINE HCL (PRESSORS) 10 MG/ML IV SOLN
INTRAVENOUS | Status: DC | PRN
  Administered 2020-03-13: 80 ug via INTRAVENOUS

## 2020-03-13 MED ORDER — ONDANSETRON HCL 4 MG/2ML IJ SOLN
INTRAMUSCULAR | Status: DC | PRN
  Administered 2020-03-13: 4 mg via INTRAVENOUS

## 2020-03-13 MED ORDER — SUGAMMADEX SODIUM 500 MG/5ML IV SOLN
INTRAVENOUS | Status: DC | PRN
  Administered 2020-03-13: 200 mg via INTRAVENOUS

## 2020-03-13 MED ORDER — MIDAZOLAM HCL 2 MG/2ML IJ SOLN
INTRAMUSCULAR | Status: AC
  Filled 2020-03-13: qty 2

## 2020-03-13 MED ORDER — ONDANSETRON HCL 4 MG/2ML IJ SOLN
INTRAMUSCULAR | Status: AC
  Filled 2020-03-13: qty 2

## 2020-03-13 MED ORDER — ONDANSETRON HCL 4 MG/2ML IJ SOLN
4.0000 mg | Freq: Once | INTRAMUSCULAR | Status: AC | PRN
  Administered 2020-03-13: 4 mg via INTRAVENOUS
  Filled 2020-03-13: qty 2

## 2020-03-13 MED ORDER — HYDROCODONE-ACETAMINOPHEN 10-325 MG PO TABS: 1.0000 | ORAL_TABLET | Freq: Four times a day (QID) | ORAL | 0 refills | Status: DC | PRN

## 2020-03-13 MED ORDER — ROCURONIUM BROMIDE 10 MG/ML (PF) SYRINGE
PREFILLED_SYRINGE | INTRAVENOUS | Status: AC
  Filled 2020-03-13: qty 10

## 2020-03-13 MED ORDER — DEXAMETHASONE SODIUM PHOSPHATE 10 MG/ML IJ SOLN
INTRAMUSCULAR | Status: AC
  Filled 2020-03-13: qty 1

## 2020-03-13 MED ORDER — CEFAZOLIN SODIUM-DEXTROSE 2-4 GM/100ML-% IV SOLN
2.0000 g | INTRAVENOUS | Status: AC
  Administered 2020-03-13: 2 g via INTRAVENOUS
  Filled 2020-03-13: qty 100

## 2020-03-13 MED ORDER — FENTANYL CITRATE (PF) 100 MCG/2ML IJ SOLN
INTRAMUSCULAR | Status: DC | PRN
  Administered 2020-03-13 (×2): 50 ug via INTRAVENOUS
  Administered 2020-03-13: 100 ug via INTRAVENOUS
  Administered 2020-03-13 (×3): 50 ug via INTRAVENOUS

## 2020-03-13 MED ORDER — FENTANYL CITRATE (PF) 100 MCG/2ML IJ SOLN
INTRAMUSCULAR | Status: AC
  Filled 2020-03-13: qty 2

## 2020-03-13 MED ORDER — ORAL CARE MOUTH RINSE
15.0000 mL | Freq: Once | OROMUCOSAL | Status: AC
  Administered 2020-03-13: 15 mL via OROMUCOSAL

## 2020-03-13 MED ORDER — ROCURONIUM BROMIDE 10 MG/ML (PF) SYRINGE
PREFILLED_SYRINGE | INTRAVENOUS | Status: DC | PRN
  Administered 2020-03-13: 60 mg via INTRAVENOUS

## 2020-03-13 MED ORDER — CHLORHEXIDINE GLUCONATE 0.12 % MT SOLN: 15.0000 mL | Freq: Once | OROMUCOSAL | Status: AC

## 2020-03-13 MED ORDER — SODIUM CHLORIDE 0.9 % IR SOLN
Status: DC | PRN
  Administered 2020-03-13: 1000 mL

## 2020-03-13 MED ORDER — LACTATED RINGERS IV SOLN
INTRAVENOUS | Status: DC
  Administered 2020-03-13: 1000 mL via INTRAVENOUS

## 2020-03-13 MED ORDER — PROPOFOL 10 MG/ML IV BOLUS
INTRAVENOUS | Status: DC | PRN
  Administered 2020-03-13: 200 mg via INTRAVENOUS

## 2020-03-13 MED ORDER — FENTANYL CITRATE (PF) 100 MCG/2ML IJ SOLN
25.0000 ug | INTRAMUSCULAR | Status: DC | PRN
  Administered 2020-03-13: 50 ug via INTRAVENOUS
  Filled 2020-03-13: qty 2

## 2020-03-13 MED ORDER — DEXAMETHASONE SODIUM PHOSPHATE 10 MG/ML IJ SOLN
INTRAMUSCULAR | Status: DC | PRN
  Administered 2020-03-13: 5 mg via INTRAVENOUS

## 2020-03-13 MED ORDER — HEMOSTATIC AGENTS (NO CHARGE) OPTIME
TOPICAL | Status: DC | PRN
  Administered 2020-03-13: 2 via TOPICAL
  Administered 2020-03-13: 1 via TOPICAL

## 2020-03-13 MED ORDER — LABETALOL HCL 5 MG/ML IV SOLN
INTRAVENOUS | Status: AC
  Filled 2020-03-13: qty 4

## 2020-03-13 MED ORDER — FENTANYL CITRATE (PF) 250 MCG/5ML IJ SOLN
INTRAMUSCULAR | Status: AC
  Filled 2020-03-13: qty 5

## 2020-03-13 MED ORDER — LABETALOL HCL 5 MG/ML IV SOLN
INTRAVENOUS | Status: DC | PRN
  Administered 2020-03-13 (×3): 5 mg via INTRAVENOUS

## 2020-03-13 MED ORDER — BUPIVACAINE LIPOSOME 1.3 % IJ SUSP
INTRAMUSCULAR | Status: DC | PRN
  Administered 2020-03-13: 20 mL

## 2020-03-13 MED ORDER — MIDAZOLAM HCL 2 MG/2ML IJ SOLN
INTRAMUSCULAR | Status: DC | PRN
  Administered 2020-03-13: 2 mg via INTRAVENOUS

## 2020-03-13 SURGICAL SUPPLY — 45 items
ADH SKN CLS APL DERMABOND .7 (GAUZE/BANDAGES/DRESSINGS) ×1
APL SRG 38 LTWT LNG FL B (MISCELLANEOUS) ×1
APPLICATOR ARISTA FLEXITIP XL (MISCELLANEOUS) ×2 IMPLANT
APPLIER CLIP ROT 10 11.4 M/L (STAPLE) ×2
APR CLP MED LRG 11.4X10 (STAPLE) ×1
BAG RETRIEVAL 10 (BASKET) ×1
CLIP APPLIE ROT 10 11.4 M/L (STAPLE) ×1 IMPLANT
CLOTH BEACON ORANGE TIMEOUT ST (SAFETY) ×2 IMPLANT
COVER LIGHT HANDLE STERIS (MISCELLANEOUS) ×4 IMPLANT
COVER WAND RF STERILE (DRAPES) ×2 IMPLANT
DERMABOND ADVANCED (GAUZE/BANDAGES/DRESSINGS) ×1
DERMABOND ADVANCED .7 DNX12 (GAUZE/BANDAGES/DRESSINGS) ×1 IMPLANT
DURAPREP 26ML APPLICATOR (WOUND CARE) ×2 IMPLANT
ELECT REM PT RETURN 9FT ADLT (ELECTROSURGICAL) ×2
ELECTRODE REM PT RTRN 9FT ADLT (ELECTROSURGICAL) ×1 IMPLANT
GLOVE BIO SURGEON STRL SZ7 (GLOVE) ×2 IMPLANT
GLOVE BIOGEL PI IND STRL 7.0 (GLOVE) ×2 IMPLANT
GLOVE BIOGEL PI INDICATOR 7.0 (GLOVE) ×2
GLOVE ECLIPSE 6.5 STRL STRAW (GLOVE) ×2 IMPLANT
GLOVE SURG SS PI 7.5 STRL IVOR (GLOVE) ×2 IMPLANT
GOWN STRL REUS W/TWL LRG LVL3 (GOWN DISPOSABLE) ×6 IMPLANT
HEMOSTAT ARISTA ABSORB 3G PWDR (HEMOSTASIS) ×2 IMPLANT
HEMOSTAT SNOW SURGICEL 2X4 (HEMOSTASIS) ×4 IMPLANT
INST SET LAPROSCOPIC AP (KITS) ×2 IMPLANT
KIT TURNOVER KIT A (KITS) ×2 IMPLANT
MANIFOLD NEPTUNE II (INSTRUMENTS) ×2 IMPLANT
NEEDLE HYPO 18GX1.5 BLUNT FILL (NEEDLE) ×2 IMPLANT
NEEDLE HYPO 21X1.5 SAFETY (NEEDLE) ×2 IMPLANT
NEEDLE INSUFFLATION 14GA 120MM (NEEDLE) ×2 IMPLANT
NS IRRIG 1000ML POUR BTL (IV SOLUTION) ×2 IMPLANT
PACK LAP CHOLE LZT030E (CUSTOM PROCEDURE TRAY) ×2 IMPLANT
PAD ARMBOARD 7.5X6 YLW CONV (MISCELLANEOUS) ×2 IMPLANT
SET BASIN LINEN APH (SET/KITS/TRAYS/PACK) ×2 IMPLANT
SET TUBE SMOKE EVAC HIGH FLOW (TUBING) ×2 IMPLANT
SLEEVE ENDOPATH XCEL 5M (ENDOMECHANICALS) ×2 IMPLANT
SUT MNCRL AB 4-0 PS2 18 (SUTURE) ×4 IMPLANT
SUT VICRYL 0 UR6 27IN ABS (SUTURE) ×2 IMPLANT
SYR 20ML LL LF (SYRINGE) ×4 IMPLANT
SYS BAG RETRIEVAL 10MM (BASKET) ×1
SYSTEM BAG RETRIEVAL 10MM (BASKET) ×1 IMPLANT
TROCAR ENDO BLADELESS 11MM (ENDOMECHANICALS) ×2 IMPLANT
TROCAR XCEL NON-BLD 5MMX100MML (ENDOMECHANICALS) ×2 IMPLANT
TROCAR XCEL UNIV SLVE 11M 100M (ENDOMECHANICALS) ×2 IMPLANT
TUBE CONNECTING 12X1/4 (SUCTIONS) ×2 IMPLANT
WARMER LAPAROSCOPE (MISCELLANEOUS) ×2 IMPLANT

## 2020-03-13 NOTE — Anesthesia Preprocedure Evaluation (Signed)
Anesthesia Evaluation  Patient identified by MRN, date of birth, ID band Patient awake    Reviewed: Allergy & Precautions, H&P , NPO status , Patient's Chart, lab work & pertinent test results, reviewed documented beta blocker date and time   Airway Mallampati: II  TM Distance: >3 FB Neck ROM: full    Dental no notable dental hx.    Pulmonary neg pulmonary ROS,    Pulmonary exam normal breath sounds clear to auscultation       Cardiovascular Exercise Tolerance: Good hypertension, negative cardio ROS   Rhythm:regular Rate:Normal     Neuro/Psych negative neurological ROS  negative psych ROS   GI/Hepatic negative GI ROS, Neg liver ROS,   Endo/Other  negative endocrine ROSdiabetes, Type 2  Renal/GU negative Renal ROS  negative genitourinary   Musculoskeletal   Abdominal   Peds  Hematology negative hematology ROS (+)   Anesthesia Other Findings   Reproductive/Obstetrics negative OB ROS                             Anesthesia Physical Anesthesia Plan  ASA: III  Anesthesia Plan: General   Post-op Pain Management:    Induction:   PONV Risk Score and Plan: Ondansetron  Airway Management Planned:   Additional Equipment:   Intra-op Plan:   Post-operative Plan:   Informed Consent: I have reviewed the patients History and Physical, chart, labs and discussed the procedure including the risks, benefits and alternatives for the proposed anesthesia with the patient or authorized representative who has indicated his/her understanding and acceptance.     Dental Advisory Given  Plan Discussed with: CRNA  Anesthesia Plan Comments:         Anesthesia Quick Evaluation

## 2020-03-13 NOTE — Discharge Instructions (Addendum)
Minimally Invasive Cholecystectomy, Care After This sheet gives you information about how to care for yourself after your procedure. Your doctor may also give you more specific instructions. If you have problems or questions, contact your doctor. What can I expect after the procedure? After the procedure, it is common:  To have pain at the areas of surgery. You will be given medicines for pain.  To vomit or feel like you may vomit.  To feel fullness in the belly (bloating) or to have pain in the shoulder. This comes from the gas that was used during the surgery. Follow these instructions at home: Medicines  Take over-the-counter and prescription medicines only as told by your doctor.  If you were prescribed an antibiotic medicine, take it as told by your doctor. Do not stop using the antibiotic even if you start to feel better.  Ask your doctor if the medicine prescribed to you: ? Requires you to avoid driving or using machinery. ? Can cause trouble pooping (constipation). You may need to take these actions to prevent or treat trouble pooping:  Drink enough fluid to keep your pee (urine) pale yellow.  Take over-the-counter or prescription medicines.  Eat foods that are high in fiber. These include beans, whole grains, and fresh fruits and vegetables.  Limit foods that are high in fat and sugar. These include fried or sweet foods. Incision care  Follow instructions from your doctor about how to take care of your cuts from surgery (incisions). Make sure you: ? Wash your hands with soap and water for at least 20 seconds before and after you change your bandage (dressing). If you cannot use soap and water, use hand sanitizer. ? Change your bandage as told by your doctor. ? Leave stitches (sutures), skin glue, or skin tape (adhesive) strips in place. They may need to stay in place for 2 weeks or longer. If tape strips get loose and curl up, you may trim the loose edges. Do not remove tape  strips completely unless your doctor says it is okay.  Do not take baths, swim, or use a hot tub until your doctor approves. Ask your doctor if you may take showers. You may only be allowed to take sponge baths.  Check your surgery area every day for signs of infection. Check for: ? More redness, swelling, or pain. ? Fluid or blood. ? Warmth. ? Pus or a bad smell.   Activity  Rest as told by your doctor.  Do not sit for a long time without moving. Get up to take short walks every 1-2 hours. This is important. Ask for help if you feel weak or unsteady.  Do not lift anything that is heavier than 10 lb (4.5 kg), or the limit that you are told, until your doctor says that it is safe.  Do not play contact sports until your doctor says it is okay.  Do not return to work or school until your doctor says it is okay.  Return to your normal activities as told by your doctor. Ask your doctor what activities are safe for you. General instructions  If you were given a medicine to help you relax (sedative) during your procedure, it can affect you for many hours. Do not drive or use machinery until your doctor says that it is safe.  Keep all follow-up visits as told by your doctor. This is important. Contact a doctor if:  You get a rash.  You have more redness, swelling, or pain around   your cuts from surgery.  You have fluid or blood coming from your cuts from surgery.  Your cuts from surgery feel warm to the touch.  You have pus or a bad smell coming from your cuts from surgery.  You have a fever.  One or more of your cuts from surgery breaks open. Get help right away if:  You have trouble breathing.  You have chest pain.  You have pain that is getting worse in your shoulders.  You faint or feel dizzy when you stand.  You have very bad pain in your belly (abdomen).  You feel like you may vomit or you vomit, and this lasts for more than one day.  You have leg  pain. Summary  After your surgery, it is common to have pain at the areas of surgery. You may also have vomiting or fullness in the belly.  Follow your doctor's instructions about medicine, activity restrictions, and caring for your surgery areas. Do not do activities that require a lot of effort.  Contact a doctor if you have a fever or other signs of infection, such as more redness, swelling, or pain around the cuts from surgery.  Get help right away if you have chest pain, increasing pain in the shoulders, or trouble breathing. This information is not intended to replace advice given to you by your health care provider. Make sure you discuss any questions you have with your health care provider. Document Revised: 01/15/2019 Document Reviewed: 01/15/2019 Elsevier Patient Education  2021 [place] Anesthesia, Adult, Care After This sheet gives you information about how to care for yourself after your procedure. Your health care provider may also give you more specific instructions. If you have problems or questions, contact your health care provider. What can I expect after the procedure? After the procedure, the following side effects are common:  Pain or discomfort at the IV site.  Nausea.  Vomiting.  Sore throat.  Trouble concentrating.  Feeling cold or chills.  Feeling weak or tired.  Sleepiness and fatigue.  Soreness and body aches. These side effects can affect parts of the body that were not involved in surgery. Follow these instructions at home: For the time period you were told by your health care provider:  Rest.  Do not participate in activities where you could fall or become injured.  Do not drive or use machinery.  Do not drink alcohol.  Do not take sleeping pills or medicines that cause drowsiness.  Do not make important decisions or sign legal documents.  Do not take care of children on your own.   Eating and drinking  Follow any  instructions from your health care provider about eating or drinking restrictions.  When you feel hungry, start by eating small amounts of foods that are soft and easy to digest (bland), such as toast. Gradually return to your regular diet.  Drink enough fluid to keep your urine pale yellow.  If you vomit, rehydrate by drinking water, juice, or clear broth. General instructions  If you have sleep apnea, surgery and certain medicines can increase your risk for breathing problems. Follow instructions from your health care provider about wearing your sleep device: ? Anytime you are sleeping, including during daytime naps. ? While taking prescription pain medicines, sleeping medicines, or medicines that make you drowsy.  Have a responsible adult stay with you for the time you are told. It is important to have someone help care for you until you are  awake and alert.  Return to your normal activities as told by your health care provider. Ask your health care provider what activities are safe for you.  Take over-the-counter and prescription medicines only as told by your health care provider.  If you smoke, do not smoke without supervision.  Keep all follow-up visits as told by your health care provider. This is important. Contact a health care provider if:  You have nausea or vomiting that does not get better with medicine.  You cannot eat or drink without vomiting.  You have pain that does not get better with medicine.  You are unable to pass urine.  You develop a skin rash.  You have a fever.  You have redness around your IV site that gets worse. Get help right away if:  You have difficulty breathing.  You have chest pain.  You have blood in your urine or stool, or you vomit blood. Summary  After the procedure, it is common to have a sore throat or nausea. It is also common to feel tired.  Have a responsible adult stay with you for the time you are told. It is important to  have someone help care for you until you are awake and alert.  When you feel hungry, start by eating small amounts of foods that are soft and easy to digest (bland), such as toast. Gradually return to your regular diet.  Drink enough fluid to keep your urine pale yellow.  Return to your normal activities as told by your health care provider. Ask your health care provider what activities are safe for you. This information is not intended to replace advice given to you by your health care provider. Make sure you discuss any questions you have with your health care provider. Document Revised: 10/17/2019 Document Reviewed: 05/16/2019 Elsevier Patient Education  2021 [employer].

## 2020-03-13 NOTE — Anesthesia Procedure Notes (Signed)
Procedure Name: Intubation Date/Time: 03/13/2020 7:34 AM Performed by: Karna Dupes, CRNA Pre-anesthesia Checklist: Patient identified, Emergency Drugs available, Suction available and Patient being monitored Patient Re-evaluated:Patient Re-evaluated prior to induction Oxygen Delivery Method: Circle system utilized Preoxygenation: Pre-oxygenation with 100% oxygen Induction Type: IV induction Ventilation: Mask ventilation without difficulty Laryngoscope Size: Mac and 4 Grade View: Grade I Tube type: Oral Number of attempts: 1 Airway Equipment and Method: Stylet and Oral airway Placement Confirmation: ETT inserted through vocal cords under direct vision,  positive ETCO2 and breath sounds checked- equal and bilateral Secured at: 22 cm Tube secured with: Tape Dental Injury: Teeth and Oropharynx as per pre-operative assessment

## 2020-03-13 NOTE — Interval H&P Note (Signed)
History and Physical Interval Note:  03/13/2020 7:16 AM  Karl Atkins  has presented today for surgery, with the diagnosis of Chronic cholecystitis.  The various methods of treatment have been discussed with the patient and family. After consideration of risks, benefits and other options for treatment, the patient has consented to  Procedure(s) with comments: LAPAROSCOPIC CHOLECYSTECTOMY (N/A) - pt knows to arrive at 6:15 as a surgical intervention.  The patient's history has been reviewed, patient examined, no change in status, stable for surgery.  I have reviewed the patient's chart and labs.  Questions were answered to the patient's satisfaction.     Aviva Signs

## 2020-03-13 NOTE — Transfer of Care (Signed)
Immediate Anesthesia Transfer of Care Note  Patient: Karl Atkins  Procedure(s) Performed: LAPAROSCOPIC CHOLECYSTECTOMY (N/A Abdomen)  Patient Location: PACU  Anesthesia Type:General  Level of Consciousness: awake, alert  and oriented  Airway & Oxygen Therapy: Patient Spontanous Breathing and Patient connected to nasal cannula oxygen  Post-op Assessment: Report given to RN and Post -op Vital signs reviewed and stable  Post vital signs: Reviewed and stable  Last Vitals:  Vitals Value Taken Time  BP 215/109 03/13/20 0830  Temp 97.8   Pulse 75 03/13/20 0832  Resp 16 03/13/20 0832  SpO2 99 % 03/13/20 0832  Vitals shown include unvalidated device data.  Last Pain:  Vitals:   03/13/20 0656  TempSrc: Oral  PainSc: 6       Patients Stated Pain Goal: 8 (81/01/75 1025)  Complications: No complications documented.

## 2020-03-13 NOTE — Op Note (Signed)
Patient:  Karl Atkins  DOB:  [birth year]  MRN:  [id number]   Preop Diagnosis: Chronic cholecystitis  Postop Diagnosis: Same  Procedure: Laparoscopic cholecystectomy  Surgeon: Aviva Signs, MD  Anes: General endotracheal  Indications: Patient is a 55 year old white male who presents with biliary colic secondary to chronic cholecystitis.  The risks and benefits of the procedure including bleeding, infection, hepatobiliary injury, and the possibility of an open procedure were fully explained to the patient, who gave informed consent.  Procedure note: The patient was placed in the supine position.  After induction of general endotracheal anesthesia, the abdomen was prepped and draped using the usual sterile technique with ChloraPrep.  Surgical site confirmation was performed.  A supraumbilical incision was made down to the fascia.  Veress needle was introduced into the abdominal cavity and confirmation of placement was done using the saline drop test.  The abdomen was then insufflated to 15 mmHg pressure.  An 11 mm trocar was introduced into the abdominal cavity under direct visualization without difficulty.  The patient was placed in reverse Trendelenburg position and an additional 11 mm trocar was placed in the epigastric region and 5 mm trochars were placed the right upper quadrant and right flank regions.  Liver was inspected noted to be within normal limits.  The gallbladder was retracted in a dynamic fashion in order to provide a critical view of the triangle of [place].  The cystic duct was first identified.  Its juncture to the infundibulum was fully identified.  Endoclips were placed proximally and distally on the cystic duct, and the cystic duct was divided.  This was likewise done to the cystic artery.  The gallbladder was freed away from the gallbladder fossa using Bovie electrocautery.  The gallbladder was delivered through the epigastric trocar site using an Endo Catch bag.  The  gallbladder fossa was inspected and any bleeding was controlled using clips and Bovie electrocautery.  Arista and Surgicel were placed on the gallbladder fossa.  All fluid and air were then evacuated from the abdominal cavity prior to removal of the trochars.  All wounds were irrigated with normal saline.  All wounds were injected with Exparel.  The skin incisions were closed using a 4-0 Monocryl subcuticular suture.  Dermabond was applied.  All tape and needle counts were correct at the end of the procedure.  The patient was extubated in the operating room and transferred to PACU in stable condition.  Complications: None  EBL: Minimal  Specimen: Gallbladder

## 2020-03-14 NOTE — Anesthesia Postprocedure Evaluation (Signed)
Anesthesia Post Note  Patient: Karl Atkins  Procedure(s) Performed: LAPAROSCOPIC CHOLECYSTECTOMY (N/A Abdomen)  Patient location during evaluation: Phase II Anesthesia Type: General Level of consciousness: awake Pain management: pain level controlled Vital Signs Assessment: post-procedure vital signs reviewed and stable Respiratory status: spontaneous breathing Cardiovascular status: blood pressure returned to baseline Postop Assessment: no headache Anesthetic complications: no   No complications documented.   Last Vitals:  Vitals:   03/13/20 0900 03/13/20 0919  BP: (!) 196/97 (!) 178/82  Pulse: 79 82  Resp: 20 20  Temp:  36.6 C  SpO2: 100% 97%    Last Pain:  Vitals:   03/13/20 0919  TempSrc: Oral  PainSc: [place]

## 2020-03-16 ENCOUNTER — Encounter (HOSPITAL_COMMUNITY): Payer: Self-pay | Admitting: General Surgery

## 2020-03-16 LAB — SURGICAL PATHOLOGY

## 2020-03-19 ENCOUNTER — Ambulatory Visit (INDEPENDENT_AMBULATORY_CARE_PROVIDER_SITE_OTHER): Payer: BC Managed Care – PPO | Admitting: Nurse Practitioner

## 2020-03-19 ENCOUNTER — Other Ambulatory Visit: Payer: Self-pay

## 2020-03-19 ENCOUNTER — Encounter (INDEPENDENT_AMBULATORY_CARE_PROVIDER_SITE_OTHER): Payer: Self-pay | Admitting: Nurse Practitioner

## 2020-03-19 VITALS — BP 121/80 | HR 84 | Temp 97.9°F | Resp 18 | Ht 73.0 in | Wt 211.0 lb

## 2020-03-19 DIAGNOSIS — E559 Vitamin D deficiency, unspecified: Secondary | ICD-10-CM | POA: Diagnosis not present

## 2020-03-19 DIAGNOSIS — I10 Essential (primary) hypertension: Secondary | ICD-10-CM | POA: Diagnosis not present

## 2020-03-19 DIAGNOSIS — K811 Chronic cholecystitis: Secondary | ICD-10-CM

## 2020-03-19 DIAGNOSIS — E119 Type 2 diabetes mellitus without complications: Secondary | ICD-10-CM | POA: Diagnosis not present

## 2020-03-19 NOTE — Progress Notes (Signed)
Subjective:  Patient ID: Karl Atkins, male    DOB: [birth year]  Age: 55 y.o. MRN: [id number]  CC:  Chief Complaint  Patient presents with  . Follow-up    Post Gallbladder removal   . Hypertension  . Diabetes  . Other    Vitamin D deficiency, hypercalcemia      HPI  This patient arrives today for the above.  Hypertension: He continues on lisinopril-hydrochlorothiazide and tolerating his medications well.  Diabetes: He continues on glyburide and Metformin.  Last A1c was 6.5, he is due for A1c check today.  Vitamin D deficiency: He continues on vitamin D3 supplement and is due to have serum levels checked today.  Hypercalcemia: Back in November 2021 it was noted that his calcium level was mildly elevated, when it was checked recently after having had surgery it had normalized.  Post gallbladder removal: He tells me he is feeling fairly well since surgery he still having some soreness and some pain, but this is being controlled with his pain medications.  He was having some issues with constipation, however this is improving with use of MiraLAX.  He feels that the surgical sites are healing well and is waiting to hear back from his surgeon to determine when his next follow-up will be.  Past Medical History:  Diagnosis Date  . Diabetic neuropathy ([place]) 05/16/2016  . Essential hypertension   . Headache    hx migraines mid 1990's  . Hyperlipidemia   . Obesity   . Type 2 diabetes mellitus ([place]) 2011      Family History  Problem Relation Age of Onset  . Diabetes Father   . Heart failure Father   . Diabetes Mother   . Heart failure Mother   . Hypertension Mother   . Diabetes Sister   . 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   Social History   Tobacco Use  . Smoking status: Never Smoker  . Smokeless tobacco: Never Used  Substance Use Topics  . Alcohol  use: Yes    Comment: occ     Current Meds  Medication Sig  . cetirizine (ZYRTEC) 5 MG tablet Take 5 mg by mouth daily.  . Cholecalciferol (VITAMIN D3) 125 MCG (5000 UT) CAPS Take 2 capsules by mouth daily.  . fenofibrate (TRICOR) 145 MG tablet TAKE ONE TABLET ONCE DAILY  . gabapentin (NEURONTIN) 300 MG capsule TAKE ONE TABLET THREE TIMES DAILY (Patient taking differently: Take 300 mg by mouth at bedtime.)  . glyBURIDE-metformin (GLUCOVANCE) 5-500 MG tablet TAKE TWO (2) TABLETS BY MOUTH 2 TIMES DAILY (Patient taking differently: Take 1 tablet by mouth as needed.)  . HYDROcodone-acetaminophen (NORCO) 10-325 MG tablet Take 1 tablet by mouth every 6 (six) hours as needed.  Marland Kitchen lisinopril-hydrochlorothiazide (ZESTORETIC) 20-25 MG tablet TAKE ONE (1) TABLET BY MOUTH EVERY DAY  . melatonin 3 MG TABS tablet Take 30 mg by mouth at bedtime.  . meloxicam (MOBIC) 15 MG tablet TAKE ONE (1) TABLET BY MOUTH EVERY DAY  . Multiple Vitamin (MULTIVITAMIN WITH MINERALS) TABS tablet Take 1 tablet by mouth daily before lunch.  . vitamin C (ASCORBIC ACID) 500 MG tablet Take 500 mg by mouth daily before lunch.  . Zinc Sulfate (ZINC 15 PO) Take by mouth daily.    ROS:  See HPI   Objective:   Today's Vitals: BP 121/80 (BP Location:  Right Arm, Patient Position: Sitting, Cuff Size: Normal)   Pulse 84   Temp 97.9 F (36.6 C) (Temporal)   Resp 18   Ht '6\' 1"'  (1.854 m)   Wt 211 lb (95.7 kg)   SpO2 98%   BMI 27.84 kg/m  Vitals with BMI 03/19/2020 03/13/2020 03/13/2020  Height '6\' 1"'  - -  Weight 211 lbs - -  BMI 35.36 - -  Systolic 144 315 400  Diastolic 80 82 97  Pulse 84 82 79     Physical Exam Vitals reviewed.  Constitutional:      Appearance: Normal appearance.  HENT:     Head: Normocephalic and atraumatic.  Cardiovascular:     Rate and Rhythm: Normal rate and regular rhythm.  Pulmonary:     Effort: Pulmonary effort is normal.     Breath sounds: Normal breath sounds.  Musculoskeletal:      Cervical back: Neck supple.  Skin:    General: Skin is warm and dry.       Neurological:     Mental Status: He is alert and oriented to person, place, and time.  Psychiatric:        Mood and Affect: Mood normal.        Behavior: Behavior normal.        Thought Content: Thought content normal.        Judgment: Judgment normal.          Assessment and Plan   1. Vitamin D deficiency disease   2. Diabetes mellitus without complication ([place])   3. Chronic cholecystitis   4. Hypertension, unspecified type   5. Hypercalcemia      Plan: 1.  We will check serum level today for further evaluation.  He will continue on his supplement for now. 2.  We will check A1c and urine for microalbuminuria.  He will continue on his current medications for now. 3.  He will follow-up with his surgeon as scheduled, surgical sites appear to be healing well.  He is encouraged to continue taking MiraLAX as needed for constipation. 4.  Blood pressure acceptable today on current regimen we will not make any changes to medications. 5.  Seems to have resolved, however will check blood work to make sure it remains normal.   Tests ordered Orders Placed This Encounter  Procedures  . Vitamin D, 25-hydroxy  . CMP with eGFR([facility])  . Hemoglobin A1c  . Microalbumin/Creatinine Ratio, Urine      No orders of the defined types were placed in this encounter.   Patient to follow-up in 3 months or sooner as needed.  Ailene Ards, NP

## 2020-03-20 ENCOUNTER — Telehealth (INDEPENDENT_AMBULATORY_CARE_PROVIDER_SITE_OTHER): Payer: BC Managed Care – PPO | Admitting: General Surgery

## 2020-03-20 ENCOUNTER — Encounter: Payer: Self-pay | Admitting: General Surgery

## 2020-03-20 DIAGNOSIS — Z09 Encounter for follow-up examination after completed treatment for conditions other than malignant neoplasm: Secondary | ICD-10-CM

## 2020-03-20 LAB — COMPLETE METABOLIC PANEL WITH GFR
AG Ratio: 1.8 (calc) (ref 1.0–2.5)
ALT: 28 U/L (ref 9–46)
AST: 14 U/L (ref 10–35)
Albumin: 4.3 g/dL (ref 3.6–5.1)
Alkaline phosphatase (APISO): 32 U/L — ABNORMAL LOW (ref 35–144)
BUN/Creatinine Ratio: 16 (calc) (ref 6–22)
BUN: 23 mg/dL (ref 7–25)
CO2: 27 mmol/L (ref 20–32)
Calcium: 10.2 mg/dL (ref 8.6–10.3)
Chloride: 101 mmol/L (ref 98–110)
Creat: 1.47 mg/dL — ABNORMAL HIGH (ref 0.70–1.33)
GFR, Est African American: 61 mL/min/{1.73_m2} (ref >=60)
GFR, Est Non African American: 53 mL/min/{1.73_m2} — ABNORMAL LOW (ref >=60)
Globulin: 2.4 g/dL (calc) (ref 1.9–3.7)
Glucose, Bld: 276 mg/dL — ABNORMAL HIGH (ref 65–139)
Potassium: 4.7 mmol/L (ref 3.5–5.3)
Sodium: 137 mmol/L (ref 135–146)
Total Bilirubin: 0.6 mg/dL (ref 0.2–1.2)
Total Protein: 6.7 g/dL (ref 6.1–8.1)

## 2020-03-20 LAB — HEMOGLOBIN A1C
Hgb A1c MFr Bld: 7.2 % of total Hgb — ABNORMAL HIGH (ref <5.7)
Mean Plasma Glucose: 160 mg/dL
eAG (mmol/L): 8.9 mmol/L

## 2020-03-20 LAB — MICROALBUMIN / CREATININE URINE RATIO
Creatinine, Urine: 298 mg/dL (ref 20–320)
Microalb Creat Ratio: 29 mcg/mg creat (ref <30)
Microalb, Ur: 8.6 mg/dL

## 2020-03-20 LAB — VITAMIN D 25 HYDROXY (VIT D DEFICIENCY, FRACTURES): Vit D, 25-Hydroxy: 41 ng/mL (ref 30–100)

## 2020-03-20 NOTE — Progress Notes (Signed)
Subjective:     Karl Atkins  Virtual telephone postoperative visit performed.  Patient states that most of his preoperative symptoms have resolved.  He is very pleased with the results.  He has only minor incisional discomfort. Objective:    There were no vitals taken for this visit.  General:  alert, cooperative and no distress  Final pathology consistent with diagnosis     Assessment:    Doing well postoperatively.    Plan:   Patient may increase his activity as able.  He was instructed to call me should any problems arise.  Follow-up here as needed.  Total telephone time was 2 minutes.  As this was a postoperative visit, it is not billable as it is a part of the global fee.

## 2020-03-23 ENCOUNTER — Other Ambulatory Visit (INDEPENDENT_AMBULATORY_CARE_PROVIDER_SITE_OTHER): Payer: Self-pay | Admitting: Nurse Practitioner

## 2020-03-23 DIAGNOSIS — N289 Disorder of kidney and ureter, unspecified: Secondary | ICD-10-CM

## 2020-04-15 ENCOUNTER — Telehealth (INDEPENDENT_AMBULATORY_CARE_PROVIDER_SITE_OTHER): Payer: Self-pay | Admitting: Nurse Practitioner

## 2020-04-15 NOTE — Telephone Encounter (Signed)
Will you call this patient and let him know that he has an appointment with [facility] on 04/20/20 @ 3:00pm? I am not sure which location it is at because the notice we got does not state the location. Fax stating patient has an appointment was from a [employer] from [employer] Their phone number is [phone] and their fax is [phone].

## 2020-04-15 NOTE — Telephone Encounter (Signed)
Called patient and gave him the information and he thanked [place] and stated that he had already spoken to them and knows about this appointment. Patient verbalized an understanding and thanked [place].

## 2020-04-20 DIAGNOSIS — N1831 Chronic kidney disease, stage 3a: Secondary | ICD-10-CM | POA: Diagnosis not present

## 2020-04-20 DIAGNOSIS — E1122 Type 2 diabetes mellitus with diabetic chronic kidney disease: Secondary | ICD-10-CM | POA: Diagnosis not present

## 2020-04-20 DIAGNOSIS — I129 Hypertensive chronic kidney disease with stage 1 through stage 4 chronic kidney disease, or unspecified chronic kidney disease: Secondary | ICD-10-CM | POA: Diagnosis not present

## 2020-04-29 ENCOUNTER — Encounter (INDEPENDENT_AMBULATORY_CARE_PROVIDER_SITE_OTHER): Payer: Self-pay

## 2020-04-29 ENCOUNTER — Other Ambulatory Visit: Payer: Self-pay

## 2020-04-29 ENCOUNTER — Encounter: Payer: Self-pay | Admitting: Internal Medicine

## 2020-04-29 ENCOUNTER — Ambulatory Visit (INDEPENDENT_AMBULATORY_CARE_PROVIDER_SITE_OTHER): Payer: BC Managed Care – PPO | Admitting: Nurse Practitioner

## 2020-04-29 ENCOUNTER — Encounter: Payer: Self-pay | Admitting: Nurse Practitioner

## 2020-04-29 VITALS — BP 164/92 | HR 81 | Temp 97.1°F | Ht 73.0 in | Wt 214.0 lb

## 2020-04-29 DIAGNOSIS — R1011 Right upper quadrant pain: Secondary | ICD-10-CM

## 2020-04-29 DIAGNOSIS — K828 Other specified diseases of gallbladder: Secondary | ICD-10-CM | POA: Insufficient documentation

## 2020-04-29 DIAGNOSIS — K811 Chronic cholecystitis: Secondary | ICD-10-CM

## 2020-04-29 NOTE — Progress Notes (Signed)
Cc'ed to pcp °

## 2020-04-29 NOTE — Patient Instructions (Signed)
Your health issues we discussed today were:   Abdominal pain with abnormal gallbladder: 1. I am glad you are doing better since surgery 2. Continue taking your current medications 3. Call us if you have any worsening or severe symptoms  Overall I recommend:  1. Continue other current medications 2. Return for follow-up in 3 months 3. Call us for any questions or concerns   ---------------------------------------------------------------  I am glad you have gotten your COVID-19 vaccination!  Even though you are fully vaccinated you should continue to follow CDC and state/local guidelines.  ---------------------------------------------------------------   At [facility] we value your feedback. You may receive a survey about your visit today. Please share your experience as we strive to create trusting relationships with our patients to provide genuine, compassionate, quality care.  We appreciate your understanding and patience as we review any laboratory studies, imaging, and other diagnostic tests that are ordered as we care for you. Our office policy is 5 business days for review of these results, and any emergent or urgent results are addressed in a timely manner for your best interest. If you do not hear from our office in 1 week, please contact us.   We also encourage the use of MyChart, which contains your medical information for your review as well. If you are not enrolled in this feature, an access code is on this after visit summary for your convenience. Thank you for allowing [place] to be involved in your care.  It was great to see you today!  I hope you have a great spring!!

## 2020-04-29 NOTE — Progress Notes (Signed)
Referring Provider: Doree Albee, MD Primary Care Physician:  Doree Albee, MD Primary GI:  Dr. Gala Romney  Chief Complaint  Patient presents with  . Abdominal Pain    Soreness RUQ and around into back. Had gallbladder removed in January by Dr. Arnoldo Morale    HPI:   Karl Atkins is a 55 y.o. male who presents for follow-up.  The patient was last seen in our office 02/27/2020 for right upper quadrant pain and history of colon polyps.  Previously seen by [facility] but prefers to be locally.  Previous ER evaluation in November 2021 for right upper quadrant pain with right upper quadrant ultrasound no abnormality identified.  Query costochondritis less likely gastritis/pancreatitis.  At his last visit noted significant family history of gallbladder issues.  Pain is right upper quadrant/epigastric with radiation to the back.  Has previously lost significant weight with diet and exercise.  Last colonoscopy 2 to 3 years ago due to GI with 2 polyps removed and recommended repeat in 5 years (will be due in 2024-2025).  Recommended HIDA scan, request previous colonoscopy records, call for worsening symptoms, follow-up in 2 months.  HIDA scan completed 03/02/2020 which found borderline low gallbladder EF at 33% (normal is greater than 33%).  He was subsequently referred for surgical evaluation.  He underwent laparoscopic cholecystectomy on 03/13/2018 today.  Postoperative visit 03/20/2020 indicates most preoperative symptoms resolved and very pleased with the results, and minor incisional discomfort.  Recommended to notify surgeon if any problems arise.  Today states she is doing okay overall. He's doing well after surgery. He has some residual soreness RUQ, and near the incision. Also some numbness. Denies diarrhea. Also limited because at work he has some heavy lifting. Denies N/V, hematochezia, melena, fever, chills, unintentional weight loss. Is avoiding heavy and greasy foods. Denies URI  or flu-like symptoms. Denies loss of sense of taste or smell. The patient has not received COVID-19 vaccination(s). They are not interested in vaccine scheduling information. Denies chest pain, dyspnea, dizziness, lightheadedness, syncope, near syncope. Denies any other upper or lower GI symptoms.  Has been referred to nephrology due to mildly elevated creatinine day of surgery. They recommended holding Meloxicam.  Past Medical History:  Diagnosis Date  . Diabetic neuropathy ([place]) 05/16/2016  . Essential hypertension   . Headache    hx migraines mid 1990's  . Hyperlipidemia   . Obesity   . Type 2 diabetes mellitus ([place]) 2011    Past Surgical History:  Procedure Laterality Date  . BACK SURGERY  2014   lower back  . CHOLECYSTECTOMY N/A 03/13/2020   Procedure: LAPAROSCOPIC CHOLECYSTECTOMY;  Surgeon: Aviva Signs, MD;  Location: [facility];  Service: General;  Laterality: N/A;  . COLONOSCOPY WITH PROPOFOL N/A 04/19/2016   Procedure: COLONOSCOPY WITH PROPOFOL;  Surgeon: Garlan Fair, MD;  Location: [facility];  Service: Endoscopy;  Laterality: N/A;  . MIDDLE EAR SURGERY     x2    Current Outpatient Medications  Medication Sig Dispense Refill  . cetirizine (ZYRTEC) 5 MG tablet Take 5 mg by mouth daily.    . Cholecalciferol (VITAMIN D3) 125 MCG (5000 UT) CAPS Take 2 capsules by mouth daily.    . fenofibrate (TRICOR) 145 MG tablet TAKE ONE TABLET ONCE DAILY 30 tablet 2  . gabapentin (NEURONTIN) 300 MG capsule TAKE ONE TABLET THREE TIMES DAILY (Patient taking differently: Take 300 mg by mouth at bedtime.) 90 capsule 3  . glyBURIDE-metformin (GLUCOVANCE) 5-500 MG tablet TAKE TWO (  2) TABLETS BY MOUTH 2 TIMES DAILY (Patient taking differently: Take 1 tablet by mouth as needed.) 120 tablet 2  . lisinopril-hydrochlorothiazide (ZESTORETIC) 20-25 MG tablet TAKE ONE (1) TABLET BY MOUTH EVERY DAY 90 tablet 0  . melatonin 3 MG TABS tablet Take 3-6 mg by mouth at bedtime as needed.    . Multiple  Vitamin (MULTIVITAMIN WITH MINERALS) TABS tablet Take 1 tablet by mouth daily before lunch.    . vitamin C (ASCORBIC ACID) 500 MG tablet Take 500 mg by mouth daily before lunch.    . Zinc Sulfate (ZINC 15 PO) Take by mouth daily.     No current facility-administered medications for this visit.    Allergies as of 04/29/2020 - Review Complete 04/29/2020  Allergen Reaction Noted  . Bee pollen Other (See Comments) 06/03/2010  . Pollen extract-tree extract Other (See Comments) 06/03/2010    Family History  Problem Relation Age of Onset  . Diabetes Father   . Heart failure Father   . Diabetes Mother   . Heart failure Mother   . Hypertension Mother   . Diabetes Sister   . Colon cancer Neg Hx     Social History   Socioeconomic History  . Marital status: Married    Spouse name: Not on file  . Number of children: Not on file  . Years of education: Not on file  . Highest education level: Not on file  Occupational History  . Occupation: Nurse, learning disability  Tobacco Use  . Smoking status: Never Smoker  . Smokeless tobacco: Never Used  Vaping Use  . Vaping Use: Never used  Substance and Sexual Activity  . Alcohol use: Yes    Comment: occ  . Drug use: No  . Sexual activity: Not on file  Other Topics Concern  . Not on file  Social History Narrative   Married for 20 years.Lives with wife.Previously Nurse, learning disability.Owns car wash.   Social Determinants of Health   Financial Resource Strain: Not on file  Food Insecurity: Not on file  Transportation Needs: Not on file  Physical Activity: Not on file  Stress: Not on file  Social Connections: Not on file    Subjective: Review of Systems  Constitutional: Negative for chills, fever, malaise/fatigue and weight loss.  HENT: Negative for congestion and sore throat.   Respiratory: Negative for cough and shortness of breath.   Cardiovascular: Negative for chest pain and palpitations.  Gastrointestinal: Positive for abdominal pain.  Negative for blood in stool, diarrhea, heartburn, melena, nausea and vomiting.  Musculoskeletal: Negative for joint pain and myalgias.  Skin: Negative for rash.  Neurological: Negative for dizziness and weakness.  Endo/Heme/Allergies: Does not bruise/bleed easily.  Psychiatric/Behavioral: Negative for depression. The patient is not nervous/anxious.   All other systems reviewed and are negative.    Objective: BP (!) 164/92   Pulse 81   Temp (!) 97.1 F (36.2 C)   Ht 6\' 1"  (1.854 m)   Wt 214 lb (97.1 kg)   BMI 28.23 kg/m  Physical Exam Vitals and nursing note reviewed.  Constitutional:      General: He is not in acute distress.    Appearance: Normal appearance. He is well-developed and normal weight. He is not ill-appearing, toxic-appearing or diaphoretic.  HENT:     Head: Normocephalic and atraumatic.     Nose: No congestion or rhinorrhea.  Eyes:     General: No scleral icterus. Cardiovascular:     Rate and Rhythm: Normal rate and regular rhythm.  Heart sounds: Normal heart sounds.  Pulmonary:     Effort: Pulmonary effort is normal.     Breath sounds: Normal breath sounds.  Abdominal:     General: Bowel sounds are normal. There is no distension.     Palpations: Abdomen is soft. There is no hepatomegaly, splenomegaly or mass.     Tenderness: There is no abdominal tenderness. There is no guarding or rebound.     Hernia: No hernia is present.  Musculoskeletal:     Cervical back: Neck supple.  Skin:    General: Skin is warm and dry.     Coloration: Skin is not jaundiced.     Findings: No bruising or rash.  Neurological:     General: No focal deficit present.     Mental Status: He is alert and oriented to person, place, and time. Mental status is at baseline.  Psychiatric:        Mood and Affect: Mood normal.        Behavior: Behavior normal.        Thought Content: Thought content normal.      Assessment:  55 year old male presents for follow-up on abdominal  pain and concerns for possible biliary dyskinesia.  He did undergo laparoscopic cholecystectomy and is done quite well since then.  His preoperative symptoms have improved significantly.  He does still have some residual soreness around the incisional area despite 8 weeks healing time.  We discussed possibility that it could take several months, especially because he previously had heavy lifting at work.  At this point he is doing well and otherwise asymptomatic from a GI standpoint.  I will have him continue his current medications and follow-up in 3 months to touch base and sure he has progressed.   Plan: 1. Continue current medications 2. Follow-up in 3 months 3. Call us for any worsening or severe symptoms    Thank you for allowing [place] to participate in the care of Celesta Gentile, DNP, AGNP-C Adult & Gerontological Nurse Practitioner [facility]   04/29/2020 11:39 AM   Disclaimer: This note was dictated with voice recognition software. Similar sounding words can inadvertently be transcribed and may not be corrected upon review.

## 2020-05-12 ENCOUNTER — Other Ambulatory Visit (INDEPENDENT_AMBULATORY_CARE_PROVIDER_SITE_OTHER): Payer: Self-pay | Admitting: Internal Medicine

## 2020-05-14 DIAGNOSIS — L570 Actinic keratosis: Secondary | ICD-10-CM | POA: Diagnosis not present

## 2020-05-14 DIAGNOSIS — B078 Other viral warts: Secondary | ICD-10-CM | POA: Diagnosis not present

## 2020-05-14 DIAGNOSIS — X32XXXD Exposure to sunlight, subsequent encounter: Secondary | ICD-10-CM | POA: Diagnosis not present

## 2020-05-14 DIAGNOSIS — Z1283 Encounter for screening for malignant neoplasm of skin: Secondary | ICD-10-CM | POA: Diagnosis not present

## 2020-05-14 DIAGNOSIS — L821 Other seborrheic keratosis: Secondary | ICD-10-CM | POA: Diagnosis not present

## 2020-05-25 ENCOUNTER — Encounter: Payer: Self-pay | Admitting: Internal Medicine

## 2020-06-18 ENCOUNTER — Encounter (INDEPENDENT_AMBULATORY_CARE_PROVIDER_SITE_OTHER): Payer: Self-pay | Admitting: Internal Medicine

## 2020-06-18 ENCOUNTER — Ambulatory Visit (INDEPENDENT_AMBULATORY_CARE_PROVIDER_SITE_OTHER): Payer: BC Managed Care – PPO | Admitting: Internal Medicine

## 2020-06-18 ENCOUNTER — Other Ambulatory Visit: Payer: Self-pay

## 2020-06-18 VITALS — BP 136/76 | HR 71 | Temp 97.7°F | Resp 18 | Ht 73.0 in | Wt 218.4 lb

## 2020-06-18 DIAGNOSIS — E782 Mixed hyperlipidemia: Secondary | ICD-10-CM | POA: Diagnosis not present

## 2020-06-18 DIAGNOSIS — I10 Essential (primary) hypertension: Secondary | ICD-10-CM

## 2020-06-18 DIAGNOSIS — E119 Type 2 diabetes mellitus without complications: Secondary | ICD-10-CM

## 2020-06-18 DIAGNOSIS — N1831 Chronic kidney disease, stage 3a: Secondary | ICD-10-CM

## 2020-06-18 NOTE — Progress Notes (Signed)
Metrics: Intervention Frequency ACO  Documented Smoking Status Yearly  Screened one or more times in 24 months  Cessation Counseling or  Active cessation medication Past 24 months  Past 24 months   Guideline developer: UpToDate (See UpToDate for funding source) Date Released: 2014       Wellness Office Visit  Subjective:  Patient ID: Karl Atkins, male    DOB: Dec 17, 1965  Age: 55 y.o. MRN: [id number]  CC: This man comes in for follow-up of diabetes, hyperlipidemia, chronic kidney disease, hypertension. HPI  He is doing well overall and he tells me that he takes Glucovance 1 tablet only when he feels this is needed and this is usually when his blood glucose is over 180.  Most of the time his blood glucose appears to be in a good range and he does not take any medication. He continues on Zestoretic for his hypertension. He continues on Tricor for his hyperlipidemia. He denies any chest pain, dyspnea, palpitations or limb weakness. Past Medical History:  Diagnosis Date  . Diabetic neuropathy ([place]) 05/16/2016  . Essential hypertension   . Headache    hx migraines mid 1990's  . Hyperlipidemia   . Obesity   . Type 2 diabetes mellitus ([place]) 2011   Past Surgical History:  Procedure Laterality Date  . BACK SURGERY  2014   lower back  . CHOLECYSTECTOMY N/A 03/13/2020   Procedure: LAPAROSCOPIC CHOLECYSTECTOMY;  Surgeon: Aviva Signs, MD;  Location: [facility];  Service: General;  Laterality: N/A;  . COLONOSCOPY WITH PROPOFOL N/A 04/19/2016   Procedure: COLONOSCOPY WITH PROPOFOL;  Surgeon: Garlan Fair, MD;  [facility];  Service: Endoscopy;  Laterality: N/A;  . MIDDLE EAR SURGERY     x2     Family History  Problem Relation Age of Onset  . Diabetes Father   . Heart failure Father   . Diabetes Mother   . Heart failure Mother   . Hypertension Mother   . Diabetes Sister   . Colon cancer Neg Hx     Social History   Social History Narrative   Married for 20  years.Lives with wife.Previously Nurse, learning disability.Owns car wash.   Social History   Tobacco Use  . Smoking status: Never Smoker  . Smokeless tobacco: Never Used  Substance Use Topics  . Alcohol use: Yes    Comment: occ    Current Meds  Medication Sig  . cetirizine (ZYRTEC) 5 MG tablet Take 5 mg by mouth daily.  . Cholecalciferol (VITAMIN D3) 125 MCG (5000 UT) CAPS Take 2 capsules by mouth daily.  . fenofibrate (TRICOR) 145 MG tablet TAKE ONE TABLET ONCE DAILY  . gabapentin (NEURONTIN) 300 MG capsule TAKE ONE TABLET THREE TIMES DAILY (Patient taking differently: Take 300 mg by mouth at bedtime.)  . glyBURIDE-metformin (GLUCOVANCE) 5-500 MG tablet TAKE TWO (2) TABLETS BY MOUTH 2 TIMES DAILY (Patient taking differently: Take 1 tablet by mouth as needed.)  . lisinopril-hydrochlorothiazide (ZESTORETIC) 20-25 MG tablet TAKE ONE (1) TABLET BY MOUTH EVERY DAY  . melatonin 3 MG TABS tablet Take 3-6 mg by mouth at bedtime as needed.  . Multiple Vitamin (MULTIVITAMIN WITH MINERALS) TABS tablet Take 1 tablet by mouth daily before lunch.  . vitamin C (ASCORBIC ACID) 500 MG tablet Take 500 mg by mouth daily before lunch.  . Zinc Sulfate (ZINC 15 PO) Take by mouth daily.     [place] Office Visit from 01/13/2020 in [facility]  PHQ-9 Total Score 0  Objective:   Today's Vitals: BP 136/76 (BP Location: Right Arm, Patient Position: Sitting, Cuff Size: Normal)   Pulse 71   Temp 97.7 F (36.5 C) (Temporal)   Resp 18   Ht 6\' 1"  (1.854 m)   Wt 218 lb 6.4 oz (99.1 kg)   SpO2 99%   BMI 28.81 kg/m  Vitals with BMI 06/18/2020 04/29/2020 03/19/2020  Height 6\' 1"  6\' 1"  6\' 1"   Weight 218 lbs 6 oz 214 lbs 211 lbs  BMI 28.82 09.38 18.29  Systolic 937 169 678  Diastolic 76 92 80  Pulse 71 81 84     Physical Exam   He looks systemically well.  He remains overweight and he has gained about 4 pounds since the last time.  Blood pressure is better than last time and reasonable.  He  is alert and orientated and without any focal neuro signs    Assessment   1. Diabetes mellitus without complication ([place])   2. Mixed hyperlipidemia   3. Stage 3a chronic kidney disease ([place])   4. Hypertension, unspecified type       Tests ordered Orders Placed This Encounter  Procedures  . COMPLETE METABOLIC PANEL WITH GFR  . Lipid panel  . Hemoglobin A1c     Plan: 1. Continue with Glucovance 1 tablet as needed and I will check an A1c.  I suspect he will need no more medication and his diabetes can be controlled with diet alone. 2. Continue with antihypertensive medications and we will check kidney function. 3. I will check lipid panel and he will continue with Tricor. 4. Further recommendations will depend on blood results and I will see him in 3 months time for follow-up.   No orders of the defined types were placed in this encounter.   Doree Albee, MD

## 2020-06-19 LAB — COMPLETE METABOLIC PANEL WITH GFR
AG Ratio: 2.1 (calc) (ref 1.0–2.5)
ALT: 13 U/L (ref 9–46)
AST: 18 U/L (ref 10–35)
Albumin: 4.5 g/dL (ref 3.6–5.1)
Alkaline phosphatase (APISO): 30 U/L — ABNORMAL LOW (ref 35–144)
BUN/Creatinine Ratio: 16 (calc) (ref 6–22)
BUN: 21 mg/dL (ref 7–25)
CO2: 28 mmol/L (ref 20–32)
Calcium: 10.4 mg/dL — ABNORMAL HIGH (ref 8.6–10.3)
Chloride: 104 mmol/L (ref 98–110)
Creat: 1.35 mg/dL — ABNORMAL HIGH (ref 0.70–1.33)
GFR, Est African American: 68 mL/min/{1.73_m2} (ref >=60)
GFR, Est Non African American: 59 mL/min/{1.73_m2} — ABNORMAL LOW (ref >=60)
Globulin: 2.1 g/dL (calc) (ref 1.9–3.7)
Glucose, Bld: 133 mg/dL (ref 65–139)
Potassium: 4.5 mmol/L (ref 3.5–5.3)
Sodium: 138 mmol/L (ref 135–146)
Total Bilirubin: 0.5 mg/dL (ref 0.2–1.2)
Total Protein: 6.6 g/dL (ref 6.1–8.1)

## 2020-06-19 LAB — LIPID PANEL
Cholesterol: 175 mg/dL (ref <200)
HDL: 43 mg/dL (ref >=40)
LDL Cholesterol (Calc): 105 mg/dL (calc) — ABNORMAL HIGH
Non-HDL Cholesterol (Calc): 132 mg/dL (calc) — ABNORMAL HIGH (ref <130)
Total CHOL/HDL Ratio: 4.1 (calc) (ref <5.0)
Triglycerides: 157 mg/dL — ABNORMAL HIGH (ref <150)

## 2020-06-19 LAB — HEMOGLOBIN A1C
Hgb A1c MFr Bld: 6.8 % of total Hgb — ABNORMAL HIGH (ref <5.7)
Mean Plasma Glucose: 148 mg/dL
eAG (mmol/L): 8.2 mmol/L

## 2020-06-20 ENCOUNTER — Other Ambulatory Visit (INDEPENDENT_AMBULATORY_CARE_PROVIDER_SITE_OTHER): Payer: Self-pay | Admitting: Internal Medicine

## 2020-06-23 ENCOUNTER — Other Ambulatory Visit (INDEPENDENT_AMBULATORY_CARE_PROVIDER_SITE_OTHER): Payer: Self-pay | Admitting: Internal Medicine

## 2020-06-29 NOTE — Progress Notes (Signed)
Please call the patient and read to him what I wrote on the MyChart message regarding his blood work.  He needs to stop the Glucovance now since his diabetes is much better.  Thanks.

## 2020-06-29 NOTE — Progress Notes (Signed)
Called lvm of instructions. Waiting for pt to return either call or mychart message.

## 2020-06-30 LAB — HM DIABETES EYE EXAM

## 2020-07-30 ENCOUNTER — Ambulatory Visit: Payer: BC Managed Care – PPO | Admitting: Nurse Practitioner

## 2020-08-01 ENCOUNTER — Encounter: Payer: Self-pay | Admitting: Gastroenterology

## 2020-08-01 NOTE — Progress Notes (Deleted)
Referring Provider: Doree Albee, MD Primary Care Physician:  Doree Albee, MD Primary GI Physician: Dr. Gala Romney  No chief complaint on file.   HPI:   Karl Atkins is a 55 y.o. male with history of biliary dyskinesia s/p laparoscopic cholecystectomy 03/13/2020 presenting today for follow-up.  Last seen in our office 04/29/2020.  Reported he was doing well after surgery with some residual soreness in the right upper quadrant near his incision.  No other significant GI symptoms.  Plan to follow-up in 3 months to ensure he continues to do well with resolution of RUQ abdominal pain.  Today:  Past Medical History:  Diagnosis Date   Diabetic neuropathy ([place]) 05/16/2016   Essential hypertension    Headache    hx migraines mid 1990's   Hyperlipidemia    Obesity    Type 2 diabetes mellitus ([place]) 2011    Past Surgical History:  Procedure Laterality Date   BACK SURGERY  2014   lower back   CHOLECYSTECTOMY N/A 03/13/2020   Procedure: LAPAROSCOPIC CHOLECYSTECTOMY;  Surgeon: Aviva Signs, MD;  Location: [facility];  Service: General;  Laterality: N/A;   COLONOSCOPY WITH PROPOFOL N/A 04/19/2016   Procedure: COLONOSCOPY WITH PROPOFOL;  Surgeon: Garlan Fair, MD;  [facility];  Service: Endoscopy;  Laterality: N/A;   MIDDLE EAR SURGERY     x2    Current Outpatient Medications  Medication Sig Dispense Refill   cetirizine (ZYRTEC) 5 MG tablet Take 5 mg by mouth daily.     Cholecalciferol (VITAMIN D3) 125 MCG (5000 UT) CAPS Take 2 capsules by mouth daily.     fenofibrate (TRICOR) 145 MG tablet TAKE ONE TABLET ONCE DAILY 30 tablet 2   gabapentin (NEURONTIN) 300 MG capsule TAKE ONE TABLET THREE TIMES DAILY (Patient taking differently: Take 300 mg by mouth at bedtime.) 90 capsule 3   glyBURIDE-metformin (GLUCOVANCE) 5-500 MG tablet TAKE TWO (2) TABLETS BY MOUTH 2 TIMES DAILY (Patient taking differently: Take 1 tablet by mouth as needed.) 120 tablet 2    lisinopril-hydrochlorothiazide (ZESTORETIC) 20-25 MG tablet TAKE ONE (1) TABLET BY MOUTH EVERY DAY 90 tablet 0   melatonin 3 MG TABS tablet Take 3-6 mg by mouth at bedtime as needed.     Multiple Vitamin (MULTIVITAMIN WITH MINERALS) TABS tablet Take 1 tablet by mouth daily before lunch.     vitamin C (ASCORBIC ACID) 500 MG tablet Take 500 mg by mouth daily before lunch.     Zinc Sulfate (ZINC 15 PO) Take by mouth daily.     No current facility-administered medications for this visit.    Allergies as of 08/03/2020 - Review Complete 06/18/2020  Allergen Reaction Noted   Bee pollen Other (See Comments) 06/03/2010   Pollen extract-tree extract Other (See Comments) 06/03/2010    Family History  Problem Relation Age of Onset   Diabetes Father    Heart failure Father    Diabetes Mother    Heart failure Mother    Hypertension Mother    Diabetes Sister    Colon cancer Neg Hx     Social History   Socioeconomic History   Marital status: Married    Spouse name: Not on file   Number of children: Not on file   Years of education: Not on file   Highest education level: Not on file  Occupational History   Occupation: Nurse, learning disability  Tobacco Use   Smoking status: Never   Smokeless tobacco: Never  Vaping Use  Vaping Use: Never used  Substance and Sexual Activity   Alcohol use: Yes    Comment: occ   Drug use: No   Sexual activity: Not on file  Other Topics Concern   Not on file  Social History Narrative   Married for 20 years.Lives with wife.Previously Nurse, learning disability.Owns car wash.   Social Determinants of Health   Financial Resource Strain: Not on file  Food Insecurity: Not on file  Transportation Needs: Not on file  Physical Activity: Not on file  Stress: Not on file  Social Connections: Not on file    Review of Systems: Gen: Denies fever, chills, anorexia. Denies fatigue, weakness, weight loss.  CV: Denies chest pain, palpitations, syncope, peripheral edema, and  claudication. Resp: Denies dyspnea at rest, cough, wheezing, coughing up blood, and pleurisy. GI: Denies vomiting blood, jaundice, and fecal incontinence.   Denies dysphagia or odynophagia. Derm: Denies rash, itching, dry skin Psych: Denies depression, anxiety, memory loss, confusion. No homicidal or suicidal ideation.  Heme: Denies bruising, bleeding, and enlarged lymph nodes.  Physical Exam: There were no vitals taken for this visit. General:   Alert and oriented. No distress noted. Pleasant and cooperative.  Head:  Normocephalic and atraumatic. Eyes:  Conjuctiva clear without scleral icterus. Mouth:  Oral mucosa pink and moist. Good dentition. No lesions. Heart:  S1, S2 present without murmurs appreciated. Lungs:  Clear to auscultation bilaterally. No wheezes, rales, or rhonchi. No distress.  Abdomen:  +BS, soft, non-tender and non-distended. No rebound or guarding. No HSM or masses noted. Msk:  Symmetrical without gross deformities. Normal posture. Extremities:  Without edema. Neurologic:  Alert and  oriented x4 Psych:  Alert and cooperative. Normal mood and affect.

## 2020-08-03 ENCOUNTER — Encounter: Payer: Self-pay | Admitting: Internal Medicine

## 2020-08-03 ENCOUNTER — Ambulatory Visit: Payer: BC Managed Care – PPO | Admitting: Gastroenterology

## 2020-08-06 DIAGNOSIS — N5201 Erectile dysfunction due to arterial insufficiency: Secondary | ICD-10-CM | POA: Diagnosis not present

## 2020-08-19 ENCOUNTER — Other Ambulatory Visit (INDEPENDENT_AMBULATORY_CARE_PROVIDER_SITE_OTHER): Payer: Self-pay | Admitting: Internal Medicine

## 2020-09-03 DIAGNOSIS — N1831 Chronic kidney disease, stage 3a: Secondary | ICD-10-CM | POA: Diagnosis not present

## 2020-09-29 ENCOUNTER — Ambulatory Visit (INDEPENDENT_AMBULATORY_CARE_PROVIDER_SITE_OTHER): Payer: BC Managed Care – PPO | Admitting: Internal Medicine

## 2020-09-29 ENCOUNTER — Other Ambulatory Visit: Payer: Self-pay | Admitting: Internal Medicine

## 2020-09-29 ENCOUNTER — Encounter: Payer: Self-pay | Admitting: Internal Medicine

## 2020-09-29 ENCOUNTER — Other Ambulatory Visit: Payer: Self-pay

## 2020-09-29 VITALS — BP 176/88 | HR 75 | Temp 97.5°F | Ht 73.0 in | Wt 223.2 lb

## 2020-09-29 DIAGNOSIS — R1011 Right upper quadrant pain: Secondary | ICD-10-CM

## 2020-09-29 MED ORDER — LIDOCAINE 5 % EX PTCH: 3.0000 | MEDICATED_PATCH | Freq: Two times a day (BID) | CUTANEOUS | 0 refills | Status: DC

## 2020-09-29 NOTE — Progress Notes (Signed)
Primary Care Physician:  Pcp, No Primary Gastroenterologist:  Dr. Nevada Crane  Pre-Procedure History & Physical: HPI:  Karl Atkins is a 55 y.o. male here for evaluation of right upper quadrant abdominal pain which has been ongoing for several months onset of symptoms after he underwent laparoscopic cholecystectomy by Dr. Arnoldo Morale.  No apparent complications from surgery.  He states he has pain and pulling localized to his lateral right upper quadrant just under his right lateral costal margin.  Worse when he sleeps on his right side and when he turns in bed.  Is not at all associated with eating a meal or bowel function.  Does not have any other abdominal pain. He feels he has localized tenderness when he touches the area.  Incidentally, he is never experienced a rash consistent with shingles in this area. He has not had any postcholecystectomy imaging. Adopting a healthy lifestyle, this gentleman is status states he is lost about 140 pounds since 2013. He denies any symptoms of reflux, odynophagia, dysphagia, early satiety nausea or vomiting.  He notes normal bowel function 1 bowel movement daily without any melena rectal bleeding.  History of adenoma removed at colonoscopy elsewhere back in 2018.  Due for surveillance examination 2024 or 25.  Past Medical History:  Diagnosis Date   Diabetic neuropathy ([place]) 05/16/2016   Essential hypertension    Headache    hx migraines mid 1990's   Hyperlipidemia    Obesity    Type 2 diabetes mellitus ([place]) 2011    Past Surgical History:  Procedure Laterality Date   BACK SURGERY  2014   lower back   CHOLECYSTECTOMY N/A 03/13/2020   Procedure: LAPAROSCOPIC CHOLECYSTECTOMY;  Surgeon: Aviva Signs, MD;  Location: [facility];  Service: General;  Laterality: N/A;   COLONOSCOPY WITH PROPOFOL N/A 04/19/2016   Surgeon: Garlan Fair, MD; Two 6 mm tubular adenomas.   MIDDLE EAR SURGERY     x2    Prior to Admission medications   Medication Sig Start Date  End Date Taking? Authorizing Provider  cetirizine (ZYRTEC) 5 MG tablet Take 5 mg by mouth daily.   Yes [provider]  Cholecalciferol (VITAMIN D3) 125 MCG (5000 UT) CAPS Take 2 capsules by mouth daily.   Yes [provider]  fenofibrate (TRICOR) 145 MG tablet TAKE ONE TABLET ONCE DAILY 06/23/20  Yes Gosrani, Nimish C, MD  gabapentin (NEURONTIN) 300 MG capsule TAKE ONE TABLET THREE TIMES DAILY Patient taking differently: Take 300 mg by mouth at bedtime. As needed 02/22/20 09/29/20 Yes Gosrani, Nimish C, MD  glyBURIDE-metformin (GLUCOVANCE) 5-500 MG tablet TAKE TWO (2) TABLETS BY MOUTH 2 TIMES DAILY Patient taking differently: Take 1 tablet by mouth as needed. 02/22/20  Yes Gosrani, Nimish C, MD  lisinopril-hydrochlorothiazide (ZESTORETIC) 20-25 MG tablet TAKE ONE (1) TABLET BY MOUTH EVERY DAY 08/19/20  Yes Ailene Ards, NP  melatonin 3 MG TABS tablet Take 3-6 mg by mouth at bedtime as needed.   Yes [provider]  Multiple Vitamin (MULTIVITAMIN WITH MINERALS) TABS tablet Take 1 tablet by mouth daily before lunch.   Yes [provider]  vitamin C (ASCORBIC ACID) 500 MG tablet Take 500 mg by mouth daily before lunch.   Yes [provider]  Zinc Sulfate (ZINC 15 PO) Take by mouth daily.   Yes [provider]    Allergies as of 09/29/2020 - Review Complete 09/29/2020  Allergen Reaction Noted   Bee pollen Other (See Comments) 06/03/2010   Pollen  extract-tree extract Other (See Comments) 06/03/2010    Family History  Problem Relation Age of Onset   Diabetes Father    Heart failure Father    Diabetes Mother    Heart failure Mother    Hypertension Mother    Diabetes Sister    Colon cancer Neg Hx     Social History   Socioeconomic History   Marital status: Married    Spouse name: Not on file   Number of children: Not on file   Years of education: Not on file   Highest education level: Not on file  Occupational History   Occupation:  Nurse, learning disability  Tobacco Use   Smoking status: Never   Smokeless tobacco: Never  Vaping Use   Vaping Use: Never used  Substance and Sexual Activity   Alcohol use: Yes    Comment: occ   Drug use: No   Sexual activity: Not on file  Other Topics Concern   Not on file  Social History Narrative   Married for 20 years.Lives with wife.Previously Nurse, learning disability.Owns car wash.   Social Determinants of Health   Financial Resource Strain: Not on file  Food Insecurity: Not on file  Transportation Needs: Not on file  Physical Activity: Not on file  Stress: Not on file  Social Connections: Not on file  Intimate Partner Violence: Not on file    Review of Systems: See HPI, otherwise negative ROS  Physical Exam: BP (!) 176/88   Pulse 75   Temp (!) 97.5 F (36.4 C)   Ht '6\' 1"'$  (1.854 m)   Wt 223 lb 3.2 oz (101.2 kg)   BMI 29.45 kg/m  General:   Alert,  well-nourished, pleasant and cooperative in NAD Neck:  Supple; no masses or thyromegaly. No significant cervical adenopathy. Lungs:  Clear throughout to auscultation.   No wheezes, crackles, or rhonchi. No acute distress. Heart:  Regular rate and rhythm; no murmurs, clicks, rubs,  or gallops. Abdomen: Non-distended, normal bowel sounds.  Soft and nontender without appreciable mass or hepatosplenomegaly.  He does have some localized tenderness to palpation in the area of the lateral right upper quadrant just below the right costal margin.  No palpable mass or other abnormality. Pulses:  Normal pulses noted. Extremities:  Without clubbing or edema.  Impression/Plan: Very pleasant 55 year old gentleman  -  status post uneventful laparoscopic cholecystectomy back in January of this year for chronic cholecystitis.  Postoperatively he has had some persisting lateral right upper quadrant abdominal pain.  It has a definite positional component. I suspect myofascial etiology lingering from surgery..  Nothing at all here to suggest persisting  complication from cholecystectomy.  There is no radicular component.  I doubt superimposed "missed" bout of shingles. I do not feel he needs any imaging studies at this time.  Recommendations:  Trial of Lido-derm patches 5%.  Applied no more than 3 patches on area of most discomfort.  Leave on for no more than 12 hours.  I recommend you apply around 8 PM at night and take off by 8 AM the next morning(disp 30 days supply with no refills)  Patient to call me in 3 weeks and let me know how the Lidoderm is working.  We will go from there.  Plan for a surveillance colonoscopy in 2025.       Notice: This dictation was prepared with Dragon dictation along with smaller phrase technology. Any transcriptional errors that result from this process are unintentional and may not be corrected  upon review.

## 2020-09-29 NOTE — Patient Instructions (Signed)
Your right-sided abdominal pain is coming more from your abdominal wall then deeper inside.  Most likely related to ongoing healing from your relatively recent surgery.  Would not be surprising to take upwards of a year or more for it to resolve.  At this time, no blood work or x-rays are indicated.  Try Lido-derm patches 5%.  Applied no more than 3 patches on area of most discomfort.  Leave on for no more than 12 hours.  I recommend you apply around 8 PM at night and take off by 8 AM the next morning(disp 30 days worth with no refills)  Call me in 3 weeks and let me know how the Lidoderm is working.  We will go from there.  Plan for a surveillance colonoscopy in 2025.  It was good seeing you today!

## 2021-02-19 ENCOUNTER — Ambulatory Visit
Admission: RE | Admit: 2021-02-19 | Discharge: 2021-02-19 | Disposition: A | Discharge status: Home / Self Care | Payer: BC Managed Care – PPO | Source: Ambulatory Visit | Attending: Urgent Care | Admitting: Urgent Care

## 2021-02-19 ENCOUNTER — Other Ambulatory Visit: Payer: Self-pay

## 2021-02-19 ENCOUNTER — Ambulatory Visit (INDEPENDENT_AMBULATORY_CARE_PROVIDER_SITE_OTHER): Payer: BC Managed Care – PPO

## 2021-02-19 VITALS — BP 166/100 | HR 95 | Temp 99.4°F | Resp 18

## 2021-02-19 DIAGNOSIS — E1159 Type 2 diabetes mellitus with other circulatory complications: Secondary | ICD-10-CM | POA: Diagnosis not present

## 2021-02-19 DIAGNOSIS — R079 Chest pain, unspecified: Secondary | ICD-10-CM | POA: Diagnosis not present

## 2021-02-19 DIAGNOSIS — R944 Abnormal results of kidney function studies: Secondary | ICD-10-CM

## 2021-02-19 DIAGNOSIS — R7989 Other specified abnormal findings of blood chemistry: Secondary | ICD-10-CM

## 2021-02-19 DIAGNOSIS — R1011 Right upper quadrant pain: Secondary | ICD-10-CM

## 2021-02-19 DIAGNOSIS — E1165 Type 2 diabetes mellitus with hyperglycemia: Secondary | ICD-10-CM

## 2021-02-19 DIAGNOSIS — E1122 Type 2 diabetes mellitus with diabetic chronic kidney disease: Secondary | ICD-10-CM

## 2021-02-19 DIAGNOSIS — Z9049 Acquired absence of other specified parts of digestive tract: Secondary | ICD-10-CM

## 2021-02-19 DIAGNOSIS — N183 Chronic kidney disease, stage 3 unspecified: Secondary | ICD-10-CM

## 2021-02-19 DIAGNOSIS — I10 Essential (primary) hypertension: Secondary | ICD-10-CM

## 2021-02-19 LAB — POCT FASTING CBG KUC MANUAL ENTRY: POCT Glucose (KUC): 296 mg/dL — AB (ref 70–99)

## 2021-02-19 MED ORDER — MELOXICAM 7.5 MG PO TABS: 7.5000 mg | ORAL_TABLET | Freq: Every day | ORAL | 0 refills | Status: DC

## 2021-02-19 MED ORDER — METFORMIN HCL 500 MG PO TABS: 500.0000 mg | ORAL_TABLET | Freq: Two times a day (BID) | ORAL | 0 refills | Status: DC

## 2021-02-19 MED ORDER — TIZANIDINE HCL 4 MG PO TABS: 4.0000 mg | ORAL_TABLET | Freq: Every day | ORAL | 0 refills | Status: DC

## 2021-02-19 MED ORDER — LOSARTAN POTASSIUM 50 MG PO TABS: 50.0000 mg | ORAL_TABLET | Freq: Every day | ORAL | 0 refills | Status: DC

## 2021-02-19 NOTE — ED Provider Notes (Signed)
[facility]   MRN: [id number] DOB: [birth year]  Subjective:   Karl Atkins is a 56 y.o. male with PMH of type 2 diabetes treated without insulin, hypertension, cholecystitis status postcholecystectomy 03/13/2020 presenting for 1 year history of acute on chronic right-sided chest/chest wall pain, right upper quadrant pain.  Symptoms are generally intermittent but this specific episode has lasted 2 weeks.  This knowledge she has not had a fever, cough, shortness of breath.  No nausea, vomiting.  He is currently not taking any of his medications for blood pressure, diabetes.  Reports that he was taken off all his medications by his regular doctor due to being well controlled.  No current facility-administered medications for this encounter.  Current Outpatient Medications:    cetirizine (ZYRTEC) 5 MG tablet, Take 5 mg by mouth daily., Disp: , Rfl:    Cholecalciferol (VITAMIN D3) 125 MCG (5000 UT) CAPS, Take 2 capsules by mouth daily., Disp: , Rfl:    fenofibrate (TRICOR) 145 MG tablet, TAKE ONE TABLET ONCE DAILY, Disp: 30 tablet, Rfl: 2   gabapentin (NEURONTIN) 300 MG capsule, TAKE ONE TABLET THREE TIMES DAILY (Patient taking differently: Take 300 mg by mouth at bedtime. As needed), Disp: 90 capsule, Rfl: 3   glyBURIDE-metformin (GLUCOVANCE) 5-500 MG tablet, TAKE TWO (2) TABLETS BY MOUTH 2 TIMES DAILY (Patient taking differently: Take 1 tablet by mouth as needed.), Disp: 120 tablet, Rfl: 2   lidocaine (LIDODERM) 5 %, Place 3 patches onto the skin every 12 (twelve) hours. Remove & Discard patch within 12 hours or as directed by MD, Disp: 30 patch, Rfl: 0   lisinopril-hydrochlorothiazide (ZESTORETIC) 20-25 MG tablet, TAKE ONE (1) TABLET BY MOUTH EVERY DAY, Disp: 90 tablet, Rfl: 0   melatonin 3 MG TABS tablet, Take 3-6 mg by mouth at bedtime as needed., Disp: , Rfl:    Multiple Vitamin (MULTIVITAMIN WITH MINERALS) TABS tablet, Take 1 tablet by mouth daily before lunch., Disp: , Rfl:     vitamin C (ASCORBIC ACID) 500 MG tablet, Take 500 mg by mouth daily before lunch., Disp: , Rfl:    Zinc Sulfate (ZINC 15 PO), Take by mouth daily., Disp: , Rfl:    Allergies  Allergen Reactions   Bee Pollen Other (See Comments)    Itchy eyes/runny nose   Pollen Extract-Tree Extract Other (See Comments)    Itchy eyes/runny nose    Past Medical History:  Diagnosis Date   Diabetic neuropathy ([place]) 05/16/2016   Essential hypertension    Headache    hx migraines mid 1990's   Hyperlipidemia    Obesity    Type 2 diabetes mellitus ([place]) 2011     Past Surgical History:  Procedure Laterality Date   BACK SURGERY  2014   lower back   CHOLECYSTECTOMY N/A 03/13/2020   Procedure: LAPAROSCOPIC CHOLECYSTECTOMY;  Surgeon: Aviva Signs, MD;  Location: [facility];  Service: General;  Laterality: N/A;   COLONOSCOPY WITH PROPOFOL N/A 04/19/2016   Surgeon: Garlan Fair, MD; Two 6 mm tubular adenomas.   MIDDLE EAR SURGERY     x2    Family History  Problem Relation Age of Onset   Diabetes Father    Heart failure Father    Diabetes Mother    Heart failure Mother    Hypertension Mother    Diabetes Sister    Colon cancer Neg Hx     Social History   Tobacco Use   Smoking status: Never   Smokeless tobacco: Never  Vaping Use  Vaping Use: Never used  Substance Use Topics   Alcohol use: Yes    Comment: occ   Drug use: No    ROS   Objective:   Vitals: BP (!) 166/100 (BP Location: Right Arm)    Pulse 95    Temp 99.4 F (37.4 C) (Oral)    Resp 18    SpO2 97%   BP Readings from Last 3 Encounters:  02/19/21 (!) 166/100  09/29/20 (!) 176/88  06/18/20 136/76    Physical Exam Constitutional:      General: He is not in acute distress.    Appearance: Normal appearance. He is well-developed and normal weight. He is not ill-appearing, toxic-appearing or diaphoretic.  HENT:     Head: Normocephalic and atraumatic.     Right Ear: External ear normal.     Left Ear: External ear  normal.     Nose: Nose normal.     Mouth/Throat:     Mouth: Mucous membranes are moist.     Pharynx: Oropharynx is clear.  Eyes:     General: No scleral icterus.       Right eye: No discharge.        Left eye: No discharge.     Extraocular Movements: Extraocular movements intact.     Pupils: Pupils are equal, round, and reactive to light.  Cardiovascular:     Rate and Rhythm: Normal rate and regular rhythm.     Heart sounds: Normal heart sounds. No murmur heard.   No friction rub. No gallop.  Pulmonary:     Effort: Pulmonary effort is normal. No respiratory distress.     Breath sounds: Normal breath sounds. No stridor. No wheezing, rhonchi or rales.  Chest:     Chest wall: Tenderness (right lateral extending to the lateral thoracic back) present.  Abdominal:     General: Bowel sounds are normal. There is no distension.     Palpations: Abdomen is soft. There is no mass.     Tenderness: There is abdominal tenderness (RUQ, mild). There is no right CVA tenderness, left CVA tenderness, guarding or rebound.  Musculoskeletal:     Cervical back: Normal range of motion.  Neurological:     Mental Status: He is alert and oriented to person, place, and time.  Psychiatric:        Mood and Affect: Mood normal.        Behavior: Behavior normal.        Thought Content: Thought content normal.        Judgment: Judgment normal.    DG Chest 2 View  Result Date: 02/19/2021 CLINICAL DATA:  right sided chest pain EXAM: CHEST - 2 VIEW COMPARISON:  Chest x-ray 01/14/2020. FINDINGS: The heart and mediastinal contours are within normal limits. No focal consolidation. No pulmonary edema. No pleural effusion. No pneumothorax. No acute osseous abnormality. Right upper lobe surgical clips. IMPRESSION: No active cardiopulmonary disease. Electronically Signed   By: Iven Finn M.D.   On: 02/19/2021 15:55    Results for orders placed or performed during the hospital encounter of 02/19/21 (from the past 24  hour(s))  POCT CBG (manual entry)     Status: Abnormal   Collection Time: 02/19/21  3:10 PM  Result Value Ref Range   POCT Glucose ([facility]) 296 (A) 70 - 99 mg/dL    Assessment and Plan :   PDMP not reviewed this encounter.  1. Right-sided chest pain   2. RUQ pain   3. Uncontrolled type  2 diabetes mellitus with hyperglycemia ([place])   4. History of cholecystectomy   5. Decreased GFR   6. Abnormal serum creatinine level    Will be restarting patient on Metformin.  Last GFR was 59, 53, 55 over the past 2 years.  This suggests that patient has CKD stage III.  He is able to take metformin at a dose of 500 mg twice daily for now.  I will also restart him on losartan once daily.  Emphasized need to follow-up with his primary care provider to revisit being on his medications long-term as he is uncontrolled with his diabetes and blood pressure now.  Patient has reproducible lateral chest wall pain and recommended management using meloxicam, tizanidine.  Also counseled on the possibility that he may have adhesions from his procedure that may be causing some of his pain.  Low suspicion for pulmonary embolism or acute hepatitis.  Labs pending.  Follow-up as soon as possible with PCP.  No signs of an acute abdomen, acute cardiopulmonary process. Counseled patient on potential for adverse effects with medications prescribed/recommended today, ER and return-to-clinic precautions discussed, patient verbalized understanding.    Jaynee Eagles, [place] 02/19/21 [phone]

## 2021-02-19 NOTE — Discharge Instructions (Addendum)
I am restarting you on your diabetes medication, metformin, at 500 mg twice daily.  I am also restarting you on blood pressure medication, losartan, at 50 mg once daily.  Both these medicines can be managed going forward by your primary care doctor.  Given that your blood sugar was bottoming out previously I would suggest not using sulfonylureas.  This was a medicine you were previously using combined with metformin.  But again, this can be discussed with your primary care doctor in the future.   For diabetes or elevated blood sugar, please make sure you are limiting and avoiding starchy, carbohydrate foods like pasta, breads, sweet breads, pastry, rice, potatoes, desserts. These foods can elevate your blood sugar. Also, limit and avoid drinks that contain a lot of sugar such as sodas, sweet teas, fruit juices.  Drinking plain water will be much more helpful, try 64 ounces of water daily.  It is okay to flavor your water naturally by cutting cucumber, lemon, mint or lime, placing it in a picture with water and drinking it over a period of 24-48 hours as long as it remains refrigerated.  For elevated blood pressure, make sure you are monitoring salt in your diet.  Do not eat restaurant foods and limit processed foods at home. I highly recommend you prepare and cook your own foods at home.  Processed foods include things like frozen meals, pre-seasoned meats and dinners, deli meats, canned foods as these foods contain a high amount of sodium/salt.  Make sure you are paying attention to sodium labels on foods you buy at the grocery store. Buy your spices separately such as garlic powder, onion powder, cumin, cayenne, parsley flakes so that you can avoid seasonings that contain salt. However, salt-free seasonings are available and can be used, an example is Mrs. Dash and includes a lot of different mixtures that do not contain salt.  Lastly, when cooking using oils that are healthier for you is important. This  includes olive oil, avocado oil, canola oil. We have discussed a lot of foods to avoid but below is a list of foods that can be very healthy to use in your diet whether it is for diabetes, cholesterol, high blood pressure, or in general healthy eating.  Salads - kale, spinach, cabbage, spring mix, arugula Fruits - avocadoes, berries (blueberries, raspberries, blackberries), apples, oranges, pomegranate, grapefruit, kiwi Vegetables - asparagus, cauliflower, broccoli, green beans, brussel sprouts, bell peppers, beets; stay away from or limit starchy vegetables like potatoes, carrots, peas Other general foods - kidney beans, egg whites, almonds, walnuts, sunflower seeds, pumpkin seeds, fat free yogurt, almond milk, flax seeds, quinoa, oats  Meat - It is better to eat lean meats and limit your red meat including pork to once a week.  Wild caught fish, chicken breast are good options as they tend to be leaner sources of good protein. Still be mindful of the sodium labels for the meats you buy.  DO NOT EAT ANY FOODS ON THIS LIST THAT YOU ARE ALLERGIC TO. For more specific needs, I highly recommend consulting a dietician or nutritionist but this can definitely be a good starting point.

## 2021-02-19 NOTE — ED Triage Notes (Signed)
Pt reports right sided chest pain, right upper quadrant pain radiates to rights sided back x 1 year. Reports he had gallbladder removed 1 year ago, and was told it may take 1- 1 1/2 year to feel better.   Pt reports hi blood sugar has being high. Pt reports his PCP told him to stop taking his medications on spring 2022.

## 2021-02-20 LAB — COMPREHENSIVE METABOLIC PANEL
ALT: 17 IU/L (ref 0–44)
AST: 21 IU/L (ref 0–40)
Albumin/Globulin Ratio: 2 (ref 1.2–2.2)
Albumin: 4.2 g/dL (ref 3.8–4.9)
Alkaline Phosphatase: 63 IU/L (ref 44–121)
BUN/Creatinine Ratio: 14 (ref 9–20)
BUN: 17 mg/dL (ref 6–24)
Bilirubin Total: 0.9 mg/dL (ref 0.0–1.2)
CO2: 22 mmol/L (ref 20–29)
Calcium: 9.6 mg/dL (ref 8.7–10.2)
Chloride: 99 mmol/L (ref 96–106)
Creatinine, Ser: 1.2 mg/dL (ref 0.76–1.27)
Globulin, Total: 2.1 g/dL (ref 1.5–4.5)
Glucose: 288 mg/dL — ABNORMAL HIGH (ref 70–99)
Potassium: 4.5 mmol/L (ref 3.5–5.2)
Sodium: 137 mmol/L (ref 134–144)
Total Protein: 6.3 g/dL (ref 6.0–8.5)
eGFR: 71 mL/min/{1.73_m2} (ref >59)

## 2021-02-20 LAB — CBC WITH DIFFERENTIAL/PLATELET
Basophils Absolute: 0.1 10*3/uL (ref 0.0–0.2)
Basos: 1 %
EOS (ABSOLUTE): 0.3 10*3/uL (ref 0.0–0.4)
Eos: 4 %
Hematocrit: 47.3 % (ref 37.5–51.0)
Hemoglobin: 16.5 g/dL (ref 13.0–17.7)
Immature Grans (Abs): 0 10*3/uL (ref 0.0–0.1)
Immature Granulocytes: 0 %
Lymphocytes Absolute: 1 10*3/uL (ref 0.7–3.1)
Lymphs: 14 %
MCH: 30.6 pg (ref 26.6–33.0)
MCHC: 34.9 g/dL (ref 31.5–35.7)
MCV: 88 fL (ref 79–97)
Monocytes Absolute: 0.5 10*3/uL (ref 0.1–0.9)
Monocytes: 6 %
Neutrophils Absolute: 5.4 10*3/uL (ref 1.4–7.0)
Neutrophils: 75 %
Platelets: 218 10*3/uL (ref 150–450)
RBC: 5.39 x10E6/uL (ref 4.14–5.80)
RDW: 12.6 % (ref 11.6–15.4)
WBC: 7.2 10*3/uL (ref 3.4–10.8)

## 2021-02-20 LAB — COVID-19, FLU A+B NAA
Influenza A, NAA: NOT DETECTED
Influenza B, NAA: NOT DETECTED
SARS-CoV-2, NAA: NOT DETECTED

## 2021-04-02 ENCOUNTER — Encounter: Payer: BC Managed Care – PPO | Admitting: Nurse Practitioner

## 2021-04-22 ENCOUNTER — Other Ambulatory Visit: Payer: Self-pay

## 2021-04-22 ENCOUNTER — Encounter: Payer: Self-pay | Admitting: Nurse Practitioner

## 2021-04-22 ENCOUNTER — Ambulatory Visit (INDEPENDENT_AMBULATORY_CARE_PROVIDER_SITE_OTHER): Payer: BC Managed Care – PPO | Admitting: Nurse Practitioner

## 2021-04-22 VITALS — BP 150/82 | HR 85 | Temp 98.2°F | Ht 73.0 in | Wt 234.0 lb

## 2021-04-22 DIAGNOSIS — E119 Type 2 diabetes mellitus without complications: Secondary | ICD-10-CM | POA: Diagnosis not present

## 2021-04-22 DIAGNOSIS — R109 Unspecified abdominal pain: Secondary | ICD-10-CM

## 2021-04-22 DIAGNOSIS — R1084 Generalized abdominal pain: Secondary | ICD-10-CM

## 2021-04-22 DIAGNOSIS — E782 Mixed hyperlipidemia: Secondary | ICD-10-CM

## 2021-04-22 DIAGNOSIS — R10A0 Flank pain, unspecified side: Secondary | ICD-10-CM

## 2021-04-22 DIAGNOSIS — S61412D Laceration without foreign body of left hand, subsequent encounter: Secondary | ICD-10-CM

## 2021-04-22 DIAGNOSIS — I10 Essential (primary) hypertension: Secondary | ICD-10-CM | POA: Diagnosis not present

## 2021-04-22 DIAGNOSIS — S61412S Laceration without foreign body of left hand, sequela: Secondary | ICD-10-CM

## 2021-04-22 DIAGNOSIS — Z23 Encounter for immunization: Secondary | ICD-10-CM | POA: Diagnosis not present

## 2021-04-22 LAB — COMPREHENSIVE METABOLIC PANEL
ALT: 14 U/L (ref 0–53)
AST: 21 U/L (ref 0–37)
Albumin: 4.1 g/dL (ref 3.5–5.2)
Alkaline Phosphatase: 58 U/L (ref 39–117)
BUN: 16 mg/dL (ref 6–23)
CO2: 28 mEq/L (ref 19–32)
Calcium: 9.6 mg/dL (ref 8.4–10.5)
Chloride: 100 mEq/L (ref 96–112)
Creatinine, Ser: 1.15 mg/dL (ref 0.40–1.50)
GFR: 71.2 mL/min (ref >60.00)
Glucose, Bld: 289 mg/dL — ABNORMAL HIGH (ref 70–99)
Potassium: 3.9 mEq/L (ref 3.5–5.1)
Sodium: 136 mEq/L (ref 135–145)
Total Bilirubin: 0.9 mg/dL (ref 0.2–1.2)
Total Protein: 6.7 g/dL (ref 6.0–8.3)

## 2021-04-22 LAB — URINALYSIS WITH CULTURE, IF INDICATED
Bilirubin Urine: NEGATIVE
Leukocytes,Ua: NEGATIVE
Nitrite: NEGATIVE
Specific Gravity, Urine: 1.025 (ref 1.000–1.030)
Total Protein, Urine: >=300 — AB
Urine Glucose: >=1000 — AB
Urobilinogen, UA: 1 (ref 0.0–1.0)
pH: 6.5 (ref 5.0–8.0)

## 2021-04-22 LAB — CBC WITH DIFFERENTIAL/PLATELET
Basophils Absolute: 0.1 10*3/uL (ref 0.0–0.1)
Basophils Relative: 1.1 % (ref 0.0–3.0)
Eosinophils Absolute: 0.4 10*3/uL (ref 0.0–0.7)
Eosinophils Relative: 5.8 % — ABNORMAL HIGH (ref 0.0–5.0)
HCT: 43.6 % (ref 39.0–52.0)
Hemoglobin: 15.3 g/dL (ref 13.0–17.0)
Lymphocytes Relative: 17.4 % (ref 12.0–46.0)
Lymphs Abs: 1.1 10*3/uL (ref 0.7–4.0)
MCHC: 35 g/dL (ref 30.0–36.0)
MCV: 87.7 fl (ref 78.0–100.0)
Monocytes Absolute: 0.5 10*3/uL (ref 0.1–1.0)
Monocytes Relative: 8.3 % (ref 3.0–12.0)
Neutro Abs: 4.3 10*3/uL (ref 1.4–7.7)
Neutrophils Relative %: 67.4 % (ref 43.0–77.0)
Platelets: 192 10*3/uL (ref 150.0–400.0)
RBC: 4.97 Mil/uL (ref 4.22–5.81)
RDW: 13.1 % (ref 11.5–15.5)
WBC: 6.3 10*3/uL (ref 4.0–10.5)

## 2021-04-22 LAB — MICROALBUMIN / CREATININE URINE RATIO
Creatinine,U: 124.3 mg/dL
Microalb Creat Ratio: 272.8 mg/g — ABNORMAL HIGH (ref 0.0–30.0)
Microalb, Ur: 339 mg/dL — ABNORMAL HIGH (ref 0.0–1.9)

## 2021-04-22 LAB — AMYLASE: Amylase: 33 U/L (ref 27–131)

## 2021-04-22 LAB — LIPASE: Lipase: 36 U/L (ref 11.0–59.0)

## 2021-04-22 MED ORDER — FENOFIBRATE 145 MG PO TABS: 145.0000 mg | ORAL_TABLET | Freq: Every day | ORAL | 2 refills | Status: DC

## 2021-04-22 MED ORDER — OLMESARTAN MEDOXOMIL 5 MG PO TABS: 5.0000 mg | ORAL_TABLET | Freq: Every day | ORAL | 0 refills | Status: DC

## 2021-04-22 MED ORDER — IBUPROFEN 800 MG PO TABS: 800.0000 mg | ORAL_TABLET | Freq: Three times a day (TID) | ORAL | 0 refills | Status: DC | PRN

## 2021-04-22 MED ORDER — METFORMIN HCL 1000 MG PO TABS: 1000.0000 mg | ORAL_TABLET | Freq: Two times a day (BID) | ORAL | 1 refills | Status: DC

## 2021-04-22 NOTE — Assessment & Plan Note (Signed)
Blood pressure not currently controlled.  We discussed options and per shared decision making we will change him to olmesartan, he will take 1 tablet by mouth daily.  He will return to office in about 2 weeks to monitor metabolic panel and response to olmesartan. ?

## 2021-04-22 NOTE — Assessment & Plan Note (Signed)
Patient will continue on fenofibrate.  Refill sent in today.  May consider starting statin therapy at next office visit. ?

## 2021-04-22 NOTE — Assessment & Plan Note (Signed)
Appears to be uncontrolled.  Per shared decision making we will increase metformin to 1000 mg by mouth twice a day, will also check urine for albuminuria.  We will follow-up in approximately 2 weeks to discuss results and how he is tolerating the increase in metformin.  May need to consider starting him on statin therapy as well. ?

## 2021-04-22 NOTE — Assessment & Plan Note (Signed)
Etiology still uncertain.  Could possibly be adhesions related to his surgery.  For now recommend he use ibuprofen as needed for pain management as well as Tylenol.  We will check CT scan for further evaluation. ?

## 2021-04-22 NOTE — Progress Notes (Signed)
? ? ? ?Subjective:  ?Patient ID: Karl Atkins, male    DOB: 07-Nov-1965  Age: 56 y.o. MRN: [id number] ? ?CC:  ?Chief Complaint  ?Patient presents with  ? Transitions Of Care  ?  Pt would like to discuss his gallbladder, is constantly in pain. Averaging 3-4 hours of sleep due to the pain.   ?  ? ? ?HPI  ?This patient arrives today for the above.  He is transitioning care back to me as he was a patient of mine at my previous practice. ? ?Right side abdominal pain: This is been ongoing for over a year.  He had his gallbladder removed and since then has been having right-sided abdominal/flank/chest wall pain.  The area is tender to touch.  He has undergone evaluation previously and tells me he has tried gabapentin (originally for restless leg syndrome but this did not seem to help with his pain), tizanidine, and meloxicam without improvement in symptoms.  Per chart review lab work from approximately 2 months ago showed hyperglycemia with a glucose of 288, normal CBC and negative COVID and flu testing.  He also had chest x-ray completed about 2 months ago which was negative for acute cardiopulmonary disease. ? ?Type 2 diabetes: He was recently restarted on his metformin takes 500 mg by mouth twice a day.  His last A1c was 6.8 and this was collected in May 2022.  As stated above most recent glucose reading on metabolic panel from 2 months ago was 288.  He was on glyburide in the past as well, but this was stopped.  He is on losartan.  I do not see that he is on statin therapy but he is on fenofibrate. ? ?Hypertension: He continues on losartan and is tolerating this well but has noticed that his blood pressure has been elevated despite being on this medication.  He has been on lisinopril-hydrochlorothiazide in the past.  He would like to discuss whether or not he needs to make medication adjustments. ? ?Hyperlipidemia: He continues on fenofibrate and is tolerating this well.  He is requesting refill today. ? ?Laceration  of left hand: He reports a few weeks back he accidentally cut his hand and thinks he may be due for tetanus shot.  He is wondering if he can have this administered today. ?Past Medical History:  ?Diagnosis Date  ? Diabetic neuropathy ([place]) 05/16/2016  ? Essential hypertension   ? Headache   ? hx migraines mid 1990's  ? Hyperlipidemia   ? Obesity   ? Type 2 diabetes mellitus ([place]) 2011  ? ? ? ? ?Family History  ?Problem Relation Age of Onset  ? Diabetes Father   ? Heart failure Father   ? Diabetes Mother   ? Heart failure Mother   ? Hypertension Mother   ? Diabetes Sister   ? Colon cancer Neg Hx   ? ? ?Social History  ? ?Social History Narrative  ? 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  ? ?Social History  ? ?Tobacco Use  ? Smoking status: Never  ? Smokeless tobacco: Never  ?Substance Use Topics  ? Alcohol use: Yes  ?  Comment: occ  ? ? ? ?Current Meds  ?Medication Sig  ? cetirizine (ZYRTEC) 5 MG tablet Take 5 mg by mouth daily.  ? Cholecalciferol (VITAMIN D3) 125 MCG (5000 UT) CAPS Take 2 capsules by mouth daily.  ? ibuprofen (ADVIL) 800 MG tablet Take 1 tablet (800 mg total) by mouth every 8 (  eight) hours as needed.  ? melatonin 3 MG TABS tablet Take 3-6 mg by mouth at bedtime as needed.  ? Multiple Vitamin (MULTIVITAMIN WITH MINERALS) TABS tablet Take 1 tablet by mouth daily before lunch.  ? olmesartan (BENICAR) 5 MG tablet Take 1 tablet (5 mg total) by mouth daily.  ? vitamin C (ASCORBIC ACID) 500 MG tablet Take 500 mg by mouth daily before lunch.  ? Zinc Sulfate (ZINC 15 PO) Take by mouth daily.  ? [DISCONTINUED] fenofibrate (TRICOR) 145 MG tablet TAKE ONE TABLET ONCE DAILY  ? [DISCONTINUED] losartan (COZAAR) 50 MG tablet Take 1 tablet (50 mg total) by mouth daily.  ? [DISCONTINUED] metFORMIN (GLUCOPHAGE) 500 MG tablet Take 1 tablet (500 mg total) by mouth 2 (two) times daily with a meal.  ? ? ?ROS:  ?See HPI ? ? ?Objective:  ? ?Today's Vitals: BP (!) 150/82 (BP Location:  Left Arm, Patient Position: Sitting, Cuff Size: Large)   Pulse 85   Temp 98.2 ?F (36.8 ?C) (Oral)   Ht _0  (1.854 m)   Wt 234 lb (106.1 kg)   SpO2 98%   BMI 30.87 kg/m?  ?Vitals with BMI 04/22/2021 02/19/2021 09/29/2020  ?Height _1  - _2   ?Weight 234 lbs - 223 lbs 3 oz  ?BMI 30.88 - 29.45  ?Systolic 947 096 283  ?Diastolic 82 662 88  ?Pulse 85 95 75  ?  ? ?Physical Exam ?Vitals reviewed.  ?Constitutional:   ?   Appearance: Normal appearance.  ?HENT:  ?   Head: Normocephalic and atraumatic.  ?Cardiovascular:  ?   Rate and Rhythm: Normal rate and regular rhythm.  ?Pulmonary:  ?   Effort: Pulmonary effort is normal.  ?   Breath sounds: Normal breath sounds.  ?Musculoskeletal:  ?   Cervical back: Neck supple.  ?Skin: ?   General: Skin is warm and dry.  ? ?    ?Neurological:  ?   Mental Status: He is alert and oriented to person, place, and time.  ?Psychiatric:     ?   Mood and Affect: Mood normal.     ?   Behavior: Behavior normal.     ?   Thought Content: Thought content normal.     ?   Judgment: Judgment normal.  ? ? ? ? ? ? ? ?Assessment and Plan  ? ?1. Hypertension, unspecified type   ?2. Flank pain   ?3. Mixed hyperlipidemia   ?4. Diabetes mellitus without complication ([place])   ?5. Generalized abdominal pain   ?6. Laceration of left hand without foreign body, sequela   ? ? ? ?Plan: ?2.  See plan under generalized abdominal pain ?6.  Laceration appears to be healing well.  Will administer tetanus shot today. ? ? ?Tests ordered ?Orders Placed This Encounter  ?Procedures  ? CT ABDOMEN PELVIS W WO CONTRAST  ? Tdap vaccine greater than or equal to 7yo IM  ? Lipase  ? Amylase  ? Comp Met (CMET)  ? CBC with Differential/Platelet  ? Urinalysis with Culture, if indicated  ? Microalbumin / creatinine urine ratio  ? ? ? ? ?Meds ordered this encounter  ?Medications  ? fenofibrate (TRICOR) 145 MG tablet  ?  Sig: Take 1 tablet (145 mg total) by mouth daily.  ?  Dispense:  30 tablet  ?  Refill:  2  ? metFORMIN (GLUCOPHAGE)  1000 MG tablet  ?  Sig: Take 1 tablet (1,000 mg total) by mouth 2 (two) times daily with  a meal.  ?  Dispense:  180 tablet  ?  Refill:  1  ? olmesartan (BENICAR) 5 MG tablet  ?  Sig: Take 1 tablet (5 mg total) by mouth daily.  ?  Dispense:  90 tablet  ?  Refill:  0  ?  Order Specific Question:   Supervising Provider  ?  Answer:   Binnie Rail [[id number]]  ? ibuprofen (ADVIL) 800 MG tablet  ?  Sig: Take 1 tablet (800 mg total) by mouth every 8 (eight) hours as needed.  ?  Dispense:  30 tablet  ?  Refill:  0  ?  Order Specific Question:   Supervising Provider  ?  Answer:   Binnie Rail [[id number]]  ? ? ?Patient to follow-up in 2 weeks, to address hypertension, diabetes, and hyperlipidemia. ? ?Ailene Ards, NP ? ?

## 2021-05-03 ENCOUNTER — Telehealth: Payer: Self-pay | Admitting: Nurse Practitioner

## 2021-05-03 NOTE — Telephone Encounter (Signed)
Pt checking status of ct referral placed on 04-22-2021 ? ?Pt requesting a cb for status update ?

## 2021-05-06 ENCOUNTER — Other Ambulatory Visit: Payer: Self-pay | Admitting: Nurse Practitioner

## 2021-05-06 ENCOUNTER — Ambulatory Visit (INDEPENDENT_AMBULATORY_CARE_PROVIDER_SITE_OTHER): Payer: BC Managed Care – PPO | Admitting: Nurse Practitioner

## 2021-05-06 ENCOUNTER — Ambulatory Visit: Payer: BC Managed Care – PPO | Admitting: Nurse Practitioner

## 2021-05-06 ENCOUNTER — Other Ambulatory Visit: Payer: Self-pay

## 2021-05-06 VITALS — BP 190/99 | HR 85 | Temp 98.2°F | Ht 73.0 in | Wt 222.4 lb

## 2021-05-06 DIAGNOSIS — I10 Essential (primary) hypertension: Secondary | ICD-10-CM

## 2021-05-06 DIAGNOSIS — R1011 Right upper quadrant pain: Secondary | ICD-10-CM

## 2021-05-06 DIAGNOSIS — R21 Rash and other nonspecific skin eruption: Secondary | ICD-10-CM

## 2021-05-06 DIAGNOSIS — B356 Tinea cruris: Secondary | ICD-10-CM | POA: Insufficient documentation

## 2021-05-06 DIAGNOSIS — R1084 Generalized abdominal pain: Secondary | ICD-10-CM

## 2021-05-06 MED ORDER — AMLODIPINE BESYLATE 10 MG PO TABS: 10.0000 mg | ORAL_TABLET | Freq: Every day | ORAL | 1 refills | Status: DC

## 2021-05-06 MED ORDER — MUPIROCIN 2 % EX OINT: TOPICAL_OINTMENT | CUTANEOUS | 0 refills | Status: DC

## 2021-05-06 MED ORDER — CLOTRIMAZOLE 1 % EX CREA: TOPICAL_CREAM | CUTANEOUS | 0 refills | Status: DC

## 2021-05-06 NOTE — Assessment & Plan Note (Signed)
Blood pressure very uncontrolled.  We had long risk versus benefit discussion regarding proceeding to the emergency department versus trying to treat outpatient.  Patient would like to try treatment on outpatient basis.  We will change him back to lisinopril-hydrochlorothiazide 20-25 mg by mouth daily, and add amlodipine 10 mg by mouth daily.  Patient will follow-up tomorrow for close monitoring.  We had discussion regarding if he experiences chest pain, headache, blurry vision, difficulty speaking or swallowing, or sensory/strength changes to any extremity he needs to call 911.  He reports understanding. ?

## 2021-05-06 NOTE — Addendum Note (Signed)
Addended by: Ailene Ards on: 05/06/2021 09:21 AM ? ? Modules accepted: Orders ? ?

## 2021-05-06 NOTE — Assessment & Plan Note (Signed)
Rash on leg almost looks consistent with bug bites.  He is not having any itching currently.  Also he tells me the rash seems to be improving over the last day or so.  For now we will just treat with mupirocin ointment to prevent secondary bacterial infection.  If rash worsens patient was told to notify me, he reports understanding. ?

## 2021-05-06 NOTE — Progress Notes (Signed)
? ? ? ?Subjective:  ?Patient ID: Karl Atkins, male    DOB: [birth year]  Age: 56 y.o. MRN: [id number] ? ?CC:  ?Chief Complaint  ?Patient presents with  ? Follow-up  ? bp concerns  ?  ? ? ?HPI  ?This patient arrives today for the above. ? ?He reports a rash to his left lower extremity as well as to his scrotum.  He also tells me that his blood pressures been running very elevated since last time I saw him. ? ?He was started on olmesartan (5 mg a day) at last office visit but reports that his blood pressure at home has been running around 1 61-9 80 systolically.  He had one day where he was experiencing significant headache and he took 3 olmesartan tablets throughout the course the day and it did drop his blood pressure slightly, but did not seem to improve much.  He found an old prescription for lisinopril-hydrochlorothiazide 20-25 mg/tab. and he started retaking this about 3 days ago.  He reports tolerating this medication well.  He denies any chest pain, shortness of breath, headache, lightheadedness, or blurry vision. ? ?The rash on his left lower extremity erupted a few days ago.  He does not recall hitting his leg or any trauma occurring to cause the rash.  Rash does not itch, he applied some " old shingles cream" which resulted in improvement in the rash.  He denies any vesicles or drainage with the rash.  The rash on scrotum has been present for a little longer and does itch.  He will periodically scratch it which he feels is causing some skin breakdown in the area and would like it to be evaluated.  He denies any pain. ? ?Past Medical History:  ?Diagnosis Date  ? Diabetic neuropathy ([employer]) 05/16/2016  ? Essential hypertension   ? Headache   ? hx migraines mid 1990's  ? Hyperlipidemia   ? Obesity   ? Type 2 diabetes mellitus ([place]) 2011  ? ? ? ? ?Family History  ?Problem Relation Age of Onset  ? Diabetes Father   ? Heart failure Father   ? Diabetes Mother   ? Heart failure Mother   ? Hypertension Mother   ?  Diabetes Sister   ? Colon cancer Neg Hx   ? ? ?Social History  ? ?Social History Narrative  ? 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  ? ?Social History  ? ?Tobacco Use  ? Smoking status: Never  ? Smokeless tobacco: Never  ?Substance Use Topics  ? Alcohol use: Yes  ?  Comment: occ  ? ? ? ?Current Meds  ?Medication Sig  ? amLODipine (NORVASC) 10 MG tablet Take 1 tablet (10 mg total) by mouth daily.  ? cetirizine (ZYRTEC) 5 MG tablet Take 5 mg by mouth daily.  ? Cholecalciferol (VITAMIN D3) 125 MCG (5000 UT) CAPS Take 2 capsules by mouth daily.  ? clotrimazole (LOTRIMIN) 1 % cream Apply 1 application to groin twice a day for 14 days  ? fenofibrate (TRICOR) 145 MG tablet Take 1 tablet (145 mg total) by mouth daily.  ? ibuprofen (ADVIL) 800 MG tablet Take 1 tablet (800 mg total) by mouth every 8 (eight) hours as needed.  ? lisinopril-hydrochlorothiazide (ZESTORETIC) 20-25 MG tablet Take 1 tablet by mouth daily.  ? melatonin 3 MG TABS tablet Take 3-6 mg by mouth at bedtime as needed.  ? metFORMIN (GLUCOPHAGE) 1000 MG tablet Take 1 tablet (1,000 mg total) by mouth  2 (two) times daily with a meal.  ? Multiple Vitamin (MULTIVITAMIN WITH MINERALS) TABS tablet Take 1 tablet by mouth daily before lunch.  ? mupirocin ointment (BACTROBAN) 2 % Apply to leg twice a day  ? vitamin C (ASCORBIC ACID) 500 MG tablet Take 500 mg by mouth daily before lunch.  ? Zinc Sulfate (ZINC 15 PO) Take by mouth daily.  ? [DISCONTINUED] lisinopril-hydrochlorothiazide (ZESTORETIC) 20-25 MG tablet Take 1 tablet by mouth daily.  ? [DISCONTINUED] lisinopril-hydrochlorothiazide (ZESTORETIC) 20-25 MG tablet Take 1 tablet by mouth daily.  ? [DISCONTINUED] olmesartan (BENICAR) 5 MG tablet Take 1 tablet (5 mg total) by mouth daily.  ? ? ?ROS:  ?Review of Systems  ?Eyes:  Negative for blurred vision.  ?Respiratory:  Negative for shortness of breath.   ?Cardiovascular:  Negative for chest pain.  ?Neurological:  Negative  for dizziness and headaches.  ? ? ?Objective:  ? ?Today's Vitals: BP (!) 190/99   Pulse 85   Temp 98.2 ?F (36.8 ?C) (Oral)   Ht '6\' 1"'$  (1.854 m)   Wt 222 lb 6 oz (100.9 kg)   SpO2 98%   BMI 29.34 kg/m?  ? ?  05/06/2021  ?  2:00 PM 05/06/2021  ?  1:46 PM 04/22/2021  ?  2:10 PM  ?Vitals with BMI  ?Height  '6\' 1"'$  '6\' 1"'$   ?Weight  222 lbs 6 oz 234 lbs  ?BMI  29.35 30.88  ?Systolic 732 202 542  ?Diastolic 99 92 82  ?Pulse  85 85  ?  ? ?Physical Exam ?Vitals reviewed.  ?Constitutional:   ?   Appearance: Normal appearance.  ?HENT:  ?   Head: Normocephalic and atraumatic.  ?Cardiovascular:  ?   Rate and Rhythm: Normal rate and regular rhythm.  ?Pulmonary:  ?   Effort: Pulmonary effort is normal.  ?   Breath sounds: Normal breath sounds.  ?Genitourinary: ? ? ?   Comments: Flat discoloration.  Some skin breakdown without bleeding or drainage.  No edema or erythema. ?Musculoskeletal:  ?   Cervical back: Neck supple.  ?Skin: ?   General: Skin is warm and dry.  ? ?    ?   Comments: Redline indicates scabbing, no open or draining lesions.  Lesions are flat.  Surrounding skin intact, no redness or edema noted.  ?Neurological:  ?   Mental Status: He is alert and oriented to person, place, and time.  ?Psychiatric:     ?   Mood and Affect: Mood normal.     ?   Behavior: Behavior normal.     ?   Thought Content: Thought content normal.     ?   Judgment: Judgment normal.  ? ? ? ? ? ? ? ?Assessment and Plan  ? ?1. Tinea cruris   ?2. Rash   ?3. Hypertension, unspecified type   ? ? ? ?Plan: ?See plan via problem list below.  ? ? ?Tests ordered ?No orders of the defined types were placed in this encounter. ? ? ? ? ?Meds ordered this encounter  ?Medications  ? clotrimazole (LOTRIMIN) 1 % cream  ?  Sig: Apply 1 application to groin twice a day for 14 days  ?  Dispense:  30 g  ?  Refill:  0  ?  Order Specific Question:   Supervising Provider  ?  Answer:   Binnie Rail [[id number]]  ? mupirocin ointment (BACTROBAN) 2 %  ?  Sig: Apply to leg  twice a day  ?  Dispense:  22 g  ?  Refill:  0  ?  Order Specific Question:   Supervising Provider  ?  Answer:   Binnie Rail [[id number]]  ? amLODipine (NORVASC) 10 MG tablet  ?  Sig: Take 1 tablet (10 mg total) by mouth daily.  ?  Dispense:  30 tablet  ?  Refill:  1  ?  Order Specific Question:   Supervising Provider  ?  Answer:   Binnie Rail [[id number]]  ? ? ?Patient to follow-up tomorrow for close monitoring. ? ?Ailene Ards, NP ? ?

## 2021-05-06 NOTE — Assessment & Plan Note (Signed)
Rash the groin appears consistent with tinea.  We will treat with clotrimazole cream.  He was encouraged to notify me if rash worsens. ?

## 2021-05-07 ENCOUNTER — Encounter: Payer: Self-pay | Admitting: Nurse Practitioner

## 2021-05-07 ENCOUNTER — Ambulatory Visit (INDEPENDENT_AMBULATORY_CARE_PROVIDER_SITE_OTHER): Payer: BC Managed Care – PPO | Admitting: Nurse Practitioner

## 2021-05-07 VITALS — BP 159/84 | HR 103 | Temp 98.5°F | Ht 73.0 in | Wt 220.4 lb

## 2021-05-07 DIAGNOSIS — I10 Essential (primary) hypertension: Secondary | ICD-10-CM | POA: Diagnosis not present

## 2021-05-07 MED ORDER — LISINOPRIL 20 MG PO TABS: 20.0000 mg | ORAL_TABLET | Freq: Every day | ORAL | 0 refills | Status: DC

## 2021-05-07 NOTE — Progress Notes (Signed)
? ? ? ?Subjective:  ?Patient ID: Karl Atkins, male    DOB: 1965/02/20  Age: 56 y.o. MRN: [id number] ? ?CC:  ?Chief Complaint  ?Patient presents with  ? Follow-up  ?  Recheck bp  ?  ? ? ?HPI  ?This patient arrives today for the above. ? ?He was seen yesterday with blood pressure of 190/99.  At that point we added amlodipine to his current antihypertensive regimen.  He tells me he did start the amlodipine last night, and monitor his blood pressure a few hours later but did not see much improvement.  He then took an additional half tablet of his lisinopril-hydrochlorothiazide.  On his at home blood pressure cuff he tells me at 10 PM his blood pressure was 142/87, about 1 hour later is 129/91, and then at midnight it was 131/87.  Overall he still is feeling well and denies any chest pain, shortness of breath, headache, or new visual changes. ? ?Today he has taken 1-1/2 tablets of his lisinopril-hydrochlorothiazide as well as amlodipine '10mg'$ . ? ?Past Medical History:  ?Diagnosis Date  ? Diabetic neuropathy ([place]) 05/16/2016  ? Essential hypertension   ? Headache   ? hx migraines mid 1990's  ? Hyperlipidemia   ? Obesity   ? Type 2 diabetes mellitus ([place]) 2011  ? ? ? ? ?Family History  ?Problem Relation Age of Onset  ? Diabetes Father   ? Heart failure Father   ? Diabetes Mother   ? Heart failure Mother   ? Hypertension Mother   ? Diabetes Sister   ? Colon cancer Neg Hx   ? ? ?Social History  ? ?Social History Narrative  ? 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  ? ?Social History  ? ?Tobacco Use  ? Smoking status: Never  ? Smokeless tobacco: Never  ?Substance Use Topics  ? Alcohol use: Yes  ?  Comment: occ  ? ? ? ?Current Meds  ?Medication Sig  ? cetirizine (ZYRTEC) 5 MG tablet Take 5 mg by mouth daily.  ? Cholecalciferol (VITAMIN D3) 125 MCG (5000 UT) CAPS Take 2 capsules by mouth daily.  ? clotrimazole (LOTRIMIN) 1 % cream Apply 1 application to groin twice a day for 14 days  ?  fenofibrate (TRICOR) 145 MG tablet Take 1 tablet (145 mg total) by mouth daily.  ? ibuprofen (ADVIL) 800 MG tablet Take 1 tablet (800 mg total) by mouth every 8 (eight) hours as needed.  ? lisinopril (ZESTRIL) 20 MG tablet Take 1 tablet (20 mg total) by mouth daily.  ? lisinopril-hydrochlorothiazide (ZESTORETIC) 20-25 MG tablet Take 1 tablet by mouth daily.  ? melatonin 3 MG TABS tablet Take 3-6 mg by mouth at bedtime as needed.  ? metFORMIN (GLUCOPHAGE) 1000 MG tablet Take 1 tablet (1,000 mg total) by mouth 2 (two) times daily with a meal.  ? Multiple Vitamin (MULTIVITAMIN WITH MINERALS) TABS tablet Take 1 tablet by mouth daily before lunch.  ? mupirocin ointment (BACTROBAN) 2 % Apply to leg twice a day  ? vitamin C (ASCORBIC ACID) 500 MG tablet Take 500 mg by mouth daily before lunch.  ? Zinc Sulfate (ZINC 15 PO) Take by mouth daily.  ? [DISCONTINUED] amLODipine (NORVASC) 10 MG tablet Take 1 tablet (10 mg total) by mouth daily.  ? ? ?ROS:  ?Review of Systems  ?Respiratory:  Negative for shortness of breath.   ?Cardiovascular:  Negative for chest pain.  ?Neurological:  Negative for dizziness and headaches.  ? ? ?  Objective:  ? ?Today's Vitals: BP (!) 190/70   Pulse (!) 103   Temp 98.5 ?F (36.9 ?C) (Oral)   Ht '6\' 1"'$  (1.854 m)   Wt 220 lb 6.4 oz (100 kg)   SpO2 97%   BMI 29.08 kg/m?  ? ?  05/07/2021  ?  3:42 PM 05/06/2021  ?  2:00 PM 05/06/2021  ?  1:46 PM  ?Vitals with BMI  ?Height '6\' 1"'$   '6\' 1"'$   ?Weight 220 lbs 6 oz  222 lbs 6 oz  ?BMI 29.08  29.35  ?Systolic 510 258 527  ?Diastolic 70 99 92  ?Pulse 103  85  ?  ? ?Physical Exam ?Vitals reviewed.  ?Constitutional:   ?   Appearance: Normal appearance.  ?HENT:  ?   Head: Normocephalic and atraumatic.  ?Cardiovascular:  ?   Rate and Rhythm: Normal rate and regular rhythm.  ?Pulmonary:  ?   Effort: Pulmonary effort is normal.  ?   Breath sounds: Normal breath sounds.  ?Musculoskeletal:  ?   Cervical back: Neck supple.  ?Skin: ?   General: Skin is warm and dry.   ?Neurological:  ?   Mental Status: He is alert and oriented to person, place, and time.  ?Psychiatric:     ?   Mood and Affect: Mood normal.     ?   Behavior: Behavior normal.     ?   Thought Content: Thought content normal.     ?   Judgment: Judgment normal.  ? ? ? ? ? ? ? ?Assessment and Plan  ? ?1. Hypertension, unspecified type   ? ? ? ?Plan: ?See plan via problem list below. ? ? ? ?Tests ordered ?No orders of the defined types were placed in this encounter. ? ? ? ? ?Meds ordered this encounter  ?Medications  ? lisinopril (ZESTRIL) 20 MG tablet  ?  Sig: Take 1 tablet (20 mg total) by mouth daily.  ?  Dispense:  90 tablet  ?  Refill:  0  ?  Order Specific Question:   Supervising Provider  ?  Answer:   Binnie Rail [[id number]]  ? ? ?Patient to follow-up in 1 week or sooner as needed. ? ?Ailene Ards, NP ? ?

## 2021-05-07 NOTE — Assessment & Plan Note (Signed)
Blood pressure initially elevated when roomed, after sitting for a few minutes it did come down.  It is still above goal.  He seems to tolerate the lisinopril better and it does appear that higher dose lisinopril seems to be more effective at lowering his blood pressure.  We will add 20 mg of lisinopril to his regimen so as not to waste the current lisinopril-hydrochlorothiazide 20-'25mg'$  medication tablets that he has.  In the future may consider putting him on lisinopril 40 mg tablets and hydrochlorothiazide 25 mg tablets.  For now he will stop the amlodipine and return next week for close monitoring of blood pressure as well as to recheck CMP.  He was told to monitor blood pressure at home between now and next week and to bring readings to next office visit. ?

## 2021-05-10 ENCOUNTER — Other Ambulatory Visit: Payer: Self-pay | Admitting: Nurse Practitioner

## 2021-05-10 DIAGNOSIS — I10 Essential (primary) hypertension: Secondary | ICD-10-CM

## 2021-05-10 MED ORDER — HYDROCHLOROTHIAZIDE 25 MG PO TABS
25.0000 mg | ORAL_TABLET | Freq: Every day | ORAL | 1 refills | Status: DC
Start: 2021-05-10 — End: 2021-06-10

## 2021-05-10 MED ORDER — LISINOPRIL 40 MG PO TABS
40.0000 mg | ORAL_TABLET | Freq: Every day | ORAL | 1 refills | Status: DC
Start: 2021-05-10 — End: 2021-05-28

## 2021-05-14 ENCOUNTER — Ambulatory Visit: Payer: BC Managed Care – PPO | Admitting: Nurse Practitioner

## 2021-05-17 DIAGNOSIS — E1122 Type 2 diabetes mellitus with diabetic chronic kidney disease: Secondary | ICD-10-CM | POA: Diagnosis not present

## 2021-05-17 DIAGNOSIS — I129 Hypertensive chronic kidney disease with stage 1 through stage 4 chronic kidney disease, or unspecified chronic kidney disease: Secondary | ICD-10-CM | POA: Diagnosis not present

## 2021-05-17 DIAGNOSIS — N1831 Chronic kidney disease, stage 3a: Secondary | ICD-10-CM | POA: Diagnosis not present

## 2021-05-28 ENCOUNTER — Ambulatory Visit (INDEPENDENT_AMBULATORY_CARE_PROVIDER_SITE_OTHER): Payer: BC Managed Care – PPO

## 2021-05-28 ENCOUNTER — Ambulatory Visit (INDEPENDENT_AMBULATORY_CARE_PROVIDER_SITE_OTHER): Payer: BC Managed Care – PPO | Admitting: Nurse Practitioner

## 2021-05-28 ENCOUNTER — Other Ambulatory Visit: Payer: Self-pay | Admitting: Nurse Practitioner

## 2021-05-28 VITALS — BP 176/80 | HR 90 | Temp 98.0°F | Ht 73.0 in | Wt 222.0 lb

## 2021-05-28 DIAGNOSIS — Z0389 Encounter for observation for other suspected diseases and conditions ruled out: Secondary | ICD-10-CM | POA: Diagnosis not present

## 2021-05-28 DIAGNOSIS — E782 Mixed hyperlipidemia: Secondary | ICD-10-CM | POA: Diagnosis not present

## 2021-05-28 DIAGNOSIS — I10 Essential (primary) hypertension: Secondary | ICD-10-CM | POA: Diagnosis not present

## 2021-05-28 DIAGNOSIS — R0789 Other chest pain: Secondary | ICD-10-CM

## 2021-05-28 DIAGNOSIS — E0865 Diabetes mellitus due to underlying condition with hyperglycemia: Secondary | ICD-10-CM

## 2021-05-28 LAB — BASIC METABOLIC PANEL
BUN: 22 mg/dL (ref 6–23)
CO2: 31 mEq/L (ref 19–32)
Calcium: 9.8 mg/dL (ref 8.4–10.5)
Chloride: 97 mEq/L (ref 96–112)
Creatinine, Ser: 1.77 mg/dL — ABNORMAL HIGH (ref 0.40–1.50)
GFR: 42.41 mL/min — ABNORMAL LOW (ref >60.00)
Glucose, Bld: 311 mg/dL — ABNORMAL HIGH (ref 70–99)
Potassium: 4.3 mEq/L (ref 3.5–5.1)
Sodium: 131 mEq/L — ABNORMAL LOW (ref 135–145)

## 2021-05-28 MED ORDER — METOPROLOL SUCCINATE ER 25 MG PO TB24: ORAL_TABLET | ORAL | 1 refills | Status: DC

## 2021-05-28 MED ORDER — LISINOPRIL 20 MG PO TABS: 20.0000 mg | ORAL_TABLET | Freq: Every day | ORAL | Status: DC

## 2021-05-28 MED ORDER — METOPROLOL SUCCINATE ER 25 MG PO TB24: 12.5000 mg | ORAL_TABLET | Freq: Every day | ORAL | 1 refills | Status: DC

## 2021-05-28 MED ORDER — FENOFIBRATE 145 MG PO TABS: 145.0000 mg | ORAL_TABLET | Freq: Every day | ORAL | 2 refills | Status: DC

## 2021-05-28 MED ORDER — SITAGLIPTIN PHOSPHATE 50 MG PO TABS: 50.0000 mg | ORAL_TABLET | Freq: Every day | ORAL | 1 refills | Status: DC

## 2021-05-28 NOTE — Progress Notes (Signed)
? ? ? ?Subjective:  ?Patient ID: Karl Atkins, male    DOB: 1965-09-18  Age: 56 y.o. MRN: [id number] ? ?CC:  ?Chief Complaint  ?Patient presents with  ? medication recheck  ?  For bp meds  ?  ? ? ?HPI  ?This patient arrives today for the above. ? ?At last office visit his hypertensive medications were adjusted that he is taking lisinopril 40 mg by mouth daily, and hydrochlorothiazide 25 mg a mouth daily.  This is increased from lisinopril to 20 mg by mouth daily.  He is tolerating medications well, he remains asymptomatic and denies chest pain, shortness of breath, headache, or blurry vision. ? ?He is also on fenofibrate for treatment of his high triglycerides and is requesting refill today.  He continues to tolerate this medication well.  Last triglyceride level was collected about 11 months ago and it was 157. ? ?He reports that he "popped" his collarbone while stretching and moving his right arm about 1 week ago.  Since then he has had some mild tenderness to the chest wall and collarbone area.  He denies bony tenderness and he denies any change in range of motion.  He is concerned about possible fracture and would like for this to be evaluated. ? ?Past Medical History:  ?Diagnosis Date  ? Diabetic neuropathy ([place]) 05/16/2016  ? Essential hypertension   ? Headache   ? hx migraines mid 1990's  ? Hyperlipidemia   ? Obesity   ? Type 2 diabetes mellitus ([place]) 2011  ? ? ? ? ?Family History  ?Problem Relation Age of Onset  ? Diabetes Father   ? Heart failure Father   ? Diabetes Mother   ? Heart failure Mother   ? Hypertension Mother   ? Diabetes Sister   ? Colon cancer Neg Hx   ? ? ?Social History  ? ?Social History Narrative  ? 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  ? ?Social History  ? ?Tobacco Use  ? Smoking status: Never  ? Smokeless tobacco: Never  ?Substance Use Topics  ? Alcohol use: Yes  ?  Comment: occ  ? ? ? ?Current Meds  ?Medication Sig  ? cetirizine (ZYRTEC) 5 MG  tablet Take 5 mg by mouth daily.  ? Cholecalciferol (VITAMIN D3) 125 MCG (5000 UT) CAPS Take 2 capsules by mouth daily.  ? clotrimazole (LOTRIMIN) 1 % cream Apply 1 application to groin twice a day for 14 days  ? hydrochlorothiazide (HYDRODIURIL) 25 MG tablet Take 1 tablet (25 mg total) by mouth daily.  ? ibuprofen (ADVIL) 800 MG tablet Take 1 tablet (800 mg total) by mouth every 8 (eight) hours as needed.  ? lisinopril (ZESTRIL) 40 MG tablet Take 1 tablet (40 mg total) by mouth daily.  ? melatonin 3 MG TABS tablet Take 3-6 mg by mouth at bedtime as needed.  ? metFORMIN (GLUCOPHAGE) 1000 MG tablet Take 1 tablet (1,000 mg total) by mouth 2 (two) times daily with a meal.  ? metoprolol succinate (TOPROL-XL) 25 MG 24 hr tablet Take 0.5 tablets (12.5 mg total) by mouth daily.  ? Multiple Vitamin (MULTIVITAMIN WITH MINERALS) TABS tablet Take 1 tablet by mouth daily before lunch.  ? mupirocin ointment (BACTROBAN) 2 % Apply to leg twice a day  ? vitamin C (ASCORBIC ACID) 500 MG tablet Take 500 mg by mouth daily before lunch.  ? Zinc Sulfate (ZINC 15 PO) Take by mouth daily.  ? [DISCONTINUED] fenofibrate (TRICOR) 145 MG tablet  Take 1 tablet (145 mg total) by mouth daily.  ? ? ?ROS:  ?See HPI ? ? ?Objective:  ? ?Today's Vitals: BP (!) 176/80 (BP Location: Left Arm, Patient Position: Sitting, Cuff Size: Large)   Pulse 90   Temp 98 ?F (36.7 ?C) (Oral)   Ht '6\' 1"'$  (1.854 m)   Wt 222 lb (100.7 kg)   SpO2 98%   BMI 29.29 kg/m?  ? ?  05/28/2021  ?  3:23 PM 05/07/2021  ?  4:00 PM 05/07/2021  ?  3:42 PM  ?Vitals with BMI  ?Height '6\' 1"'$   '6\' 1"'$   ?Weight 222 lbs  220 lbs 6 oz  ?BMI 29.3  29.08  ?Systolic 638 466 599  ?Diastolic 80 84 70  ?Pulse 90  103  ?  ? ?Physical Exam ?Vitals reviewed.  ?Constitutional:   ?   Appearance: Normal appearance.  ?HENT:  ?   Head: Normocephalic and atraumatic.  ?Cardiovascular:  ?   Rate and Rhythm: Normal rate and regular rhythm.  ?Pulmonary:  ?   Effort: Pulmonary effort is normal.  ?   Breath  sounds: Normal breath sounds.  ?Musculoskeletal:  ?   Cervical back: Neck supple.  ?Skin: ?   General: Skin is warm and dry.  ?Neurological:  ?   Mental Status: He is alert and oriented to person, place, and time.  ?Psychiatric:     ?   Mood and Affect: Mood normal.     ?   Behavior: Behavior normal.     ?   Thought Content: Thought content normal.     ?   Judgment: Judgment normal.  ? ? ? ? ? ? ? ?Assessment and Plan  ? ?1. Hypertension, unspecified type   ?2. Mixed hyperlipidemia   ?3. Chest wall pain   ? ? ? ?Plan: ?See plan via problem list below. ? ? ?Tests ordered ?Orders Placed This Encounter  ?Procedures  ? DG Chest 2 View  ? Basic Metabolic Panel (BMET)  ? ? ? ? ?Meds ordered this encounter  ?Medications  ? fenofibrate (TRICOR) 145 MG tablet  ?  Sig: Take 1 tablet (145 mg total) by mouth daily.  ?  Dispense:  90 tablet  ?  Refill:  2  ? metoprolol succinate (TOPROL-XL) 25 MG 24 hr tablet  ?  Sig: Take 0.5 tablets (12.5 mg total) by mouth daily.  ?  Dispense:  90 tablet  ?  Refill:  1  ?  Order Specific Question:   Supervising Provider  ?  Answer:   Binnie Rail [[id number]]  ? ? ?Patient to follow-up in 2 weeks or sooner as needed. ? ?Ailene Ards, NP ? ?

## 2021-05-28 NOTE — Progress Notes (Signed)
Notify patient that his x-ray was negative for fracture.  Also notified him of his blood work results and that I would recommend he reduce his lisinopril back down to 20 mg by mouth daily.  We will start him on Januvia 50 mg by mouth daily for treatment of his hyperglycemia in setting of type 2 diabetes.  He will follow-up next week for close monitoring of blood pressure and to consider rechecking metabolic panel.  Patient is aware of the recommendations and agreeable. ?

## 2021-05-28 NOTE — Patient Instructions (Signed)
Metoprolol Extended-Release Tablets ?What is this medication? ?METOPROLOL (me TOE proe lole) treats high blood pressure and heart failure. It may also be used to prevent chest pain (angina). It works by lowering your blood pressure and heart rate, making it easier for your heart to pump blood to the rest of your body. It belongs to a group of medications called beta blockers. ?This medicine may be used for other purposes; ask your health care provider or pharmacist if you have questions. ?COMMON BRAND NAME(S): toprol, Toprol XL ?What should I tell my care team before I take this medication? ?They need to know if you have any of these conditions: ?Diabetes ?Heart or vessel disease like slow heart rate, worsening heart failure, heart block, sick sinus syndrome, or Raynaud's disease ?Kidney disease ?Liver disease ?Lung or breathing disease, like asthma or emphysema ?Pheochromocytoma ?Thyroid disease ?An unusual or allergic reaction to metoprolol, other beta blockers, medications, foods, dyes, or preservatives ?Pregnant or trying to get pregnant ?Breast-feeding ?How should I use this medication? ?Take this medication by mouth. Take it as directed on the prescription label at the same time every day. Take it with food. You may cut the tablet in half if it is scored (has a line in the middle of it). This may help you swallow the tablet if the whole tablet is too big. Be sure to take both halves. Do not take just one-half of the tablet. Keep taking it unless your care team tells you to stop. ?Talk to your care team about the use of this medication in children. While it may be prescribed for children as young as 6 years for selected conditions, precautions do apply. ?Overdosage: If you think you have taken too much of this medicine contact a poison control center or emergency room at once. ?NOTE: This medicine is only for you. Do not share this medicine with others. ?What if I miss a dose? ?If you miss a dose, take it as  soon as you can. If it is almost time for your next dose, take only that dose. Do not take double or extra doses. ?What may interact with this medication? ?This medication may interact with the following: ?Certain medications for blood pressure, heart disease, irregular heartbeat ?Certain medications for depression, like monoamine oxidase (MAO) inhibitors, fluoxetine, or paroxetine ?Clonidine ?Dobutamine ?Epinephrine ?Isoproterenol ?Reserpine ?This list may not describe all possible interactions. Give your health care provider a list of all the medicines, herbs, non-prescription drugs, or dietary supplements you use. Also tell them if you smoke, drink alcohol, or use illegal drugs. Some items may interact with your medicine. ?What should I watch for while using this medication? ?Visit your care team for regular checks on your progress. Check your blood pressure as directed. Ask your care team what your blood pressure should be. Also, find out when you should contact them. ?Do not treat yourself for coughs, colds, or pain while you are using this medication without asking your care team for advice. Some medications may increase your blood pressure. ?You may get drowsy or dizzy. Do not drive, use machinery, or do anything that needs mental alertness until you know how this medication affects you. Do not stand up or sit up quickly, especially if you are an older patient. This reduces the risk of dizzy or fainting spells. Alcohol may interfere with the effect of this medication. Avoid alcoholic drinks. ?This medication may increase blood sugar. Ask your care team if changes in diet or medications are needed if  you have diabetes. ?What side effects may I notice from receiving this medication? ?Side effects that you should report to your care team as soon as possible: ?Allergic reactions--skin rash, itching, hives, swelling of the face, lips, tongue, or throat ?Heart failure--shortness of breath, swelling of the ankles,  feet, or hands, sudden weight gain, unusual weakness or fatigue ?Low blood pressure--dizziness, feeling faint or lightheaded, blurry vision ?Raynaud's--cool, numb, or painful fingers or toes that may change color from pale, to blue, to red ?Slow heartbeat--dizziness, feeling faint or lightheaded, confusion, trouble breathing, unusual weakness or fatigue ?Worsening mood, feelings of depression ?Side effects that usually do not require medical attention (report to your care team if they continue or are bothersome): ?Change in sex drive or performance ?Diarrhea ?Dizziness ?Fatigue ?Headache ?This list may not describe all possible side effects. Call your doctor for medical advice about side effects. You may report side effects to FDA at 1-800-FDA-1088. ?Where should I keep my medication? ?Keep out of the reach of children and pets. ?Store at room temperature between 20 and 25 degrees C (68 and 77 degrees F). Throw away any unused medication after the expiration date. ?NOTE: This sheet is a summary. It may not cover all possible information. If you have questions about this medicine, talk to your doctor, pharmacist, or health care provider. ?? 2023 Elsevier/Gold Standard (2021-01-01 00:00:00) ? ?

## 2021-05-28 NOTE — Assessment & Plan Note (Signed)
Blood pressure still uncontrolled.  He will continue taking lisinopril 40 mg mouth daily, hydrochlorothiazide 25 mg mouth daily, and we will add metoprolol 12.5 mg by mouth daily.  We will recheck a metabolic panel today to monitor kidney function electrolytes since increasing lisinopril dose.  He will follow-up again in 2 weeks for close monitoring of blood pressure. ?

## 2021-05-28 NOTE — Assessment & Plan Note (Addendum)
Acute, mild.  Very unlikely that he has experienced fracture, however will x-ray chest today for further evaluation due to patient's anxiety regarding possible fracture.  He was provided reassurance today in the office.  Further recommendations may be made based upon x-ray results. ?

## 2021-05-28 NOTE — Assessment & Plan Note (Signed)
Fenofibrate 145 mg by mouth daily refilled and sent to pharmacy today. ?

## 2021-05-31 ENCOUNTER — Telehealth: Payer: BC Managed Care – PPO | Admitting: Nurse Practitioner

## 2021-05-31 NOTE — Telephone Encounter (Signed)
Pt doesn't have insurance per pharmacy to cover Januvia, not sure if you have an idea of what would be more cost effective for pt..? ?

## 2021-05-31 NOTE — Telephone Encounter (Signed)
Pharmacy called and states pt does not have insurance and is not able to afford sitaGLIPtin (JANUVIA) 50 MG tablet that was called in on Friday.  ? ?Please send something more cost effective in ASAP . ? ?

## 2021-06-04 ENCOUNTER — Ambulatory Visit (INDEPENDENT_AMBULATORY_CARE_PROVIDER_SITE_OTHER): Payer: BC Managed Care – PPO | Admitting: Nurse Practitioner

## 2021-06-04 VITALS — BP 162/84 | HR 89 | Temp 97.7°F | Ht 73.0 in | Wt 225.0 lb

## 2021-06-04 DIAGNOSIS — I10 Essential (primary) hypertension: Secondary | ICD-10-CM

## 2021-06-04 DIAGNOSIS — E119 Type 2 diabetes mellitus without complications: Secondary | ICD-10-CM

## 2021-06-04 LAB — COMPREHENSIVE METABOLIC PANEL
ALT: 16 U/L (ref 0–53)
AST: 22 U/L (ref 0–37)
Albumin: 4 g/dL (ref 3.5–5.2)
Alkaline Phosphatase: 31 U/L — ABNORMAL LOW (ref 39–117)
BUN: 22 mg/dL (ref 6–23)
CO2: 27 mEq/L (ref 19–32)
Calcium: 9.2 mg/dL (ref 8.4–10.5)
Chloride: 100 mEq/L (ref 96–112)
Creatinine, Ser: 1.58 mg/dL — ABNORMAL HIGH (ref 0.40–1.50)
GFR: 48.59 mL/min — ABNORMAL LOW (ref >60.00)
Glucose, Bld: 186 mg/dL — ABNORMAL HIGH (ref 70–99)
Potassium: 3.9 mEq/L (ref 3.5–5.1)
Sodium: 135 mEq/L (ref 135–145)
Total Bilirubin: 0.6 mg/dL (ref 0.2–1.2)
Total Protein: 6.4 g/dL (ref 6.0–8.3)

## 2021-06-04 MED ORDER — GLIPIZIDE 5 MG PO TABS: 2.5000 mg | ORAL_TABLET | Freq: Every day | ORAL | 2 refills | Status: DC

## 2021-06-04 NOTE — Assessment & Plan Note (Addendum)
Chronic, remains uncontrolled.  Continue lisinopril 20 mg by mouth daily, hydrochlorothiazide 25 mg mouth daily, metoprolol will be increased to 25 mg a mouth daily.  Rechecking CMP today to monitor kidney function.  May consider referral to nephrology to look for secondary causes of hypertension such as renal artery stenosis.  Further recommendations will be made once metabolic panel results have been received.  Patient to follow-up in 2 weeks for close monitoring of blood pressure. ?

## 2021-06-04 NOTE — Patient Instructions (Signed)
Glipizide: take 1/2 tablet by mouth with your first meal for 1 week. If after that week, your fasting blood sugars are still >200 then add a 1/2 tablet by mouth with your last meal as well.  ? ?Metoprolol: Start to take 1 tablet by mouth daily.  ?

## 2021-06-04 NOTE — Assessment & Plan Note (Signed)
Continue metformin 1000 mg by mouth twice a day, per shared decision making will initiate glipizide 2.5 mg by mouth daily.  Will recheck metabolic panel today for further evaluation, further recommendations may be made based upon these results.  We will need to consider statin therapy in the future as well. ?

## 2021-06-04 NOTE — Progress Notes (Signed)
? ? ? ?Subjective:  ?Patient ID: Karl Atkins, male    DOB: 09/27/1965  Age: 56 y.o. MRN: [id number] ? ?CC:  ?Chief Complaint  ?Patient presents with  ? Follow-up  ?  Recheck bp ?  ?  ? ? ?HPI  ?This patient arrives today for the above. ? ?At last office visit he was initiated on metoprolol 12.5 mg by mouth daily, I recommended he increase to 58m by mouth daily after the first week if he tolerated medication well.  However he continues in the 12.5 mg a mouth daily today.  He reports tolerating medication well at this time.  Reduce lisinopril from 40 down to 20 due to signs of acute kidney injury on last metabolic panel.  He also continues on hydrochlorothiazide 25 mg by mouth daily. ? ?His diabetes is not well controlled as his recent metabolic panels are showing blood sugars in the upper 200s.  He continues on metformin, he was on glimepiride in the past and would like this to be restarted if possible.  I had tried to prescribe him Januvia, but insurance would not cover this I reach out to our pharmacist to see what other options are available based on his insurance.  Per pharmacist patient does not have insurance that covers medications so we will need to consider generic such as glipizide. ?  ?Past Medical History:  ?Diagnosis Date  ? [place]  ? Essential hypertension   ? Headache   ? hx migraines mid 1990's  ? Hyperlipidemia   ? Obesity   ? [place] 2011  ? ? ? ? ?Family History  ?Problem Relation Age of Onset  ? Diabetes Father   ? Heart failure Father   ? Diabetes Mother   ? Heart failure Mother   ? Hypertension Mother   ? Diabetes Sister   ? Colon cancer Neg Hx   ? ? ?Social History  ? ?Social History Narrative  ? Married for 20 years.Lives with wife.Previously FNurse, learning disabilityOwns car wash.  ? ?Social History  ? ?Tobacco Use  ? Smoking status: Never  ? Smokeless tobacco: Never  ?Substance Use Topics  ? Alcohol use: Yes  ?  Comment: occ  ? ? ? ?Current Meds   ?Medication Sig  ? cetirizine (ZYRTEC) 5 MG tablet Take 5 mg by mouth daily.  ? Cholecalciferol (VITAMIN D3) 125 MCG (5000 UT) CAPS Take 2 capsules by mouth daily.  ? clotrimazole (LOTRIMIN) 1 % cream Apply 1 application to groin twice a day for 14 days  ? fenofibrate (TRICOR) 145 MG tablet Take 1 tablet (145 mg total) by mouth daily.  ? glipiZIDE (GLUCOTROL) 5 MG tablet Take 0.5 tablets (2.5 mg total) by mouth daily before breakfast.  ? hydrochlorothiazide (HYDRODIURIL) 25 MG tablet Take 1 tablet (25 mg total) by mouth daily.  ? ibuprofen (ADVIL) 800 MG tablet Take 1 tablet (800 mg total) by mouth every 8 (eight) hours as needed.  ? lisinopril (ZESTRIL) 20 MG tablet Take 1 tablet (20 mg total) by mouth daily.  ? melatonin 3 MG TABS tablet Take 3-6 mg by mouth at bedtime as needed.  ? metFORMIN (GLUCOPHAGE) 1000 MG tablet Take 1 tablet (1,000 mg total) by mouth 2 (two) times daily with a meal.  ? metoprolol succinate (TOPROL-XL) 25 MG 24 hr tablet Take 1/2 tablet by mouth daily for 1 week if tolerated, then increase to 1 tablet by mouth daily.  ? Multiple Vitamin (MULTIVITAMIN WITH MINERALS)  TABS tablet Take 1 tablet by mouth daily before lunch.  ? mupirocin ointment (BACTROBAN) 2 % Apply to leg twice a day  ? vitamin C (ASCORBIC ACID) 500 MG tablet Take 500 mg by mouth daily before lunch.  ? Zinc Sulfate (ZINC 15 PO) Take by mouth daily.  ? [DISCONTINUED] sitaGLIPtin (JANUVIA) 50 MG tablet Take 1 tablet (50 mg total) by mouth daily. FOR DIABETES  ? ? ?ROS:  ?Review of Systems  ?Eyes:  Negative for blurred vision.  ?Respiratory:  Negative for shortness of breath.   ?Cardiovascular:  Negative for chest pain.  ?Neurological:  Negative for headaches.  ? ? ?Objective:  ? ?Today's Vitals: BP (!) 162/84 (BP Location: Right Arm, Patient Position: Sitting, Cuff Size: Large)   Pulse 89   Temp 97.7 ?F (36.5 ?C) (Oral)   Ht '6\' 1"'  (1.854 m)   Wt 225 lb (102.1 kg)   SpO2 97%   BMI 29.69 kg/m?  ? ?  06/04/2021  ?  2:51 PM  05/28/2021  ?  3:23 PM 05/07/2021  ?  4:00 PM  ?Vitals with BMI  ?Height '6\' 1"'  '6\' 1"'    ?Weight 225 lbs 222 lbs   ?BMI 29.69 29.3   ?Systolic 902 111 552  ?Diastolic 84 80 84  ?Pulse 89 90   ?  ? ?Physical Exam ?Vitals reviewed.  ?Constitutional:   ?   Appearance: Normal appearance.  ?HENT:  ?   Head: Normocephalic and atraumatic.  ?Cardiovascular:  ?   Rate and Rhythm: Normal rate and regular rhythm.  ?Pulmonary:  ?   Effort: Pulmonary effort is normal.  ?   Breath sounds: Normal breath sounds.  ?Musculoskeletal:  ?   Cervical back: Neck supple.  ?Skin: ?   General: Skin is warm and dry.  ?Neurological:  ?   Mental Status: He is alert and oriented to person, place, and time.  ?Psychiatric:     ?   Mood and Affect: Mood normal.     ?   Behavior: Behavior normal.     ?   Thought Content: Thought content normal.     ?   Judgment: Judgment normal.  ? ? ? ? ? ? ? ?Assessment and Plan  ? ?1. Hypertension, unspecified type   ?2. Diabetes mellitus without complication ([place])   ? ? ? ?Plan: ?See plan via problem list below. ? ? ?Tests ordered ?Orders Placed This Encounter  ?Procedures  ? Comp Met (CMET)  ? ? ? ? ?Meds ordered this encounter  ?Medications  ? glipiZIDE (GLUCOTROL) 5 MG tablet  ?  Sig: Take 0.5 tablets (2.5 mg total) by mouth daily before breakfast.  ?  Dispense:  30 tablet  ?  Refill:  2  ?  Order Specific Question:   Supervising Provider  ?  Answer:   Binnie Rail [[id number]]  ? ? ?Patient to follow-up in 2 weeks or sooner as needed. ? ?Ailene Ards, NP ? ?

## 2021-06-10 ENCOUNTER — Other Ambulatory Visit: Payer: Self-pay | Admitting: Nurse Practitioner

## 2021-06-10 DIAGNOSIS — I10 Essential (primary) hypertension: Secondary | ICD-10-CM

## 2021-06-10 MED ORDER — LISINOPRIL-HYDROCHLOROTHIAZIDE 20-25 MG PO TABS
1.0000 | ORAL_TABLET | Freq: Every day | ORAL | 3 refills | Status: DC
Start: 2021-06-10 — End: 2021-06-24

## 2021-06-11 ENCOUNTER — Ambulatory Visit: Payer: BC Managed Care – PPO | Admitting: Nurse Practitioner

## 2021-06-18 ENCOUNTER — Ambulatory Visit (INDEPENDENT_AMBULATORY_CARE_PROVIDER_SITE_OTHER): Payer: BC Managed Care – PPO | Admitting: Nurse Practitioner

## 2021-06-18 ENCOUNTER — Encounter: Payer: Self-pay | Admitting: Nurse Practitioner

## 2021-06-18 VITALS — BP 140/82 | HR 72 | Temp 98.3°F | Ht 73.0 in | Wt 227.0 lb

## 2021-06-18 DIAGNOSIS — N289 Disorder of kidney and ureter, unspecified: Secondary | ICD-10-CM | POA: Diagnosis not present

## 2021-06-18 DIAGNOSIS — I10 Essential (primary) hypertension: Secondary | ICD-10-CM | POA: Diagnosis not present

## 2021-06-18 DIAGNOSIS — E119 Type 2 diabetes mellitus without complications: Secondary | ICD-10-CM | POA: Diagnosis not present

## 2021-06-18 DIAGNOSIS — S8410XA Injury of peroneal nerve at lower leg level, unspecified leg, initial encounter: Secondary | ICD-10-CM | POA: Insufficient documentation

## 2021-06-18 DIAGNOSIS — M5126 Other intervertebral disc displacement, lumbar region: Secondary | ICD-10-CM | POA: Insufficient documentation

## 2021-06-18 DIAGNOSIS — M25852 Other specified joint disorders, left hip: Secondary | ICD-10-CM | POA: Insufficient documentation

## 2021-06-18 LAB — BASIC METABOLIC PANEL
BUN: 26 mg/dL — ABNORMAL HIGH (ref 6–23)
CO2: 31 mEq/L (ref 19–32)
Calcium: 9.7 mg/dL (ref 8.4–10.5)
Chloride: 101 mEq/L (ref 96–112)
Creatinine, Ser: 1.66 mg/dL — ABNORMAL HIGH (ref 0.40–1.50)
GFR: 45.78 mL/min — ABNORMAL LOW (ref >60.00)
Glucose, Bld: 191 mg/dL — ABNORMAL HIGH (ref 70–99)
Potassium: 4.7 mEq/L (ref 3.5–5.1)
Sodium: 137 mEq/L (ref 135–145)

## 2021-06-18 LAB — HEMOGLOBIN A1C: Hgb A1c MFr Bld: 9.1 % — ABNORMAL HIGH (ref 4.6–6.5)

## 2021-06-18 MED ORDER — GLIPIZIDE 5 MG PO TABS: 5.0000 mg | ORAL_TABLET | Freq: Every day | ORAL | 2 refills | Status: DC

## 2021-06-18 NOTE — Assessment & Plan Note (Signed)
Chronic, much improved on current regimen.  He will continue on lisinopril 20 mg/day, hydrochlorothiazide 25 mg/day, metoprolol 25 mg/day.  We will recheck CMP today to monitor kidney function.  He was encouraged to continue following up with his nephrologist as scheduled, I will also reach out to her regarding possible evaluation for secondary causes of hypertension. ?

## 2021-06-18 NOTE — Assessment & Plan Note (Signed)
Chronic, worsening since increasing lisinopril.  We will recheck metabolic panel today to monitor for signs of improvement since lisinopril was reduced back to 20 mg/day.  I will also reach out to patient's nephrologist to inquire about possible evaluation for secondary causes of hypertension.  Further recommendations to be made based upon conversation with nephrology as well as metabolic panel results. ?

## 2021-06-18 NOTE — Assessment & Plan Note (Signed)
Chronic, A1c ordered today.  We will continue metformin 1000 mg twice daily and glipizide 5 mg/day.  Further recommendations may be made based upon his A1c results. ?

## 2021-06-18 NOTE — Progress Notes (Signed)
? ?Established Patient Office Visit ? ?Subjective   ?Patient ID: Karl Atkins, male    DOB: 01-10-1966  Age: 56 y.o. MRN: [id number] ? ?Chief Complaint  ?Patient presents with  ? Hypertension  ? Diabetes  ? Chronic Kidney Disease  ? ? ?Hypertension: He continues on lisinopril-hydrochlorothiazide 20-25 mg by mouth daily, and metoprolol 25 mg by mouth daily.  We had previously increase his lisinopril to 40 mg by mouth daily, but when this occurred kidney function declined and creatinine rose.  He was then returned back down to lisinopril 20 mg by mouth daily.  He is tolerating the medication well.  He is here for blood pressure check.  ? ?Diabetes: He continues on glipizide 5 mg by mouth daily and metformin 1000 mg twice a day.  His current insurance does not have great drug coverage so he is unable to afford newer agents.  Last A1c was collected almost 1 year ago and it was 6.8 at that time.  Last blood glucose on metabolic panel was 939. ? ?CKD: He reports that he is a patient of Dr. Moshe Cipro, and tells me he sees her about every 6 months.  Most recent EGFR was 48.5 and creatinine was 1.58.  Baseline EGFR is around 71 with a creatinine of 1.15. ? ? ? ?Review of Systems  ?Respiratory:  Negative for shortness of breath.   ?Cardiovascular:  Negative for chest pain.  ?Neurological:  Negative for dizziness and headaches.  ? ?  ?Objective:  ?  ? ?BP 140/82 (BP Location: Left Arm, Patient Position: Sitting, Cuff Size: Large)   Pulse 72   Temp 98.3 ?F (36.8 ?C) (Oral)   Ht '6\' 1"'  (1.854 m)   Wt 227 lb (103 kg)   SpO2 98%   BMI 29.95 kg/m?  ?BP Readings from Last 3 Encounters:  ?06/18/21 140/82  ?06/04/21 (!) 162/84  ?05/28/21 (!) 176/80  ? ?  ? ?Physical Exam ?Vitals reviewed.  ?Constitutional:   ?   Appearance: Normal appearance.  ?HENT:  ?   Head: Normocephalic and atraumatic.  ?Cardiovascular:  ?   Rate and Rhythm: Normal rate and regular rhythm.  ?Pulmonary:  ?   Effort: Pulmonary effort is normal.  ?   Breath  sounds: Normal breath sounds.  ?Musculoskeletal:  ?   Cervical back: Neck supple.  ?Skin: ?   General: Skin is warm and dry.  ?Neurological:  ?   Mental Status: He is alert and oriented to person, place, and time.  ?Psychiatric:     ?   Mood and Affect: Mood normal.     ?   Behavior: Behavior normal.     ?   Thought Content: Thought content normal.     ?   Judgment: Judgment normal.  ? ? ? ?No results found for any visits on 06/18/21. ? ? ? ?The 10-year ASCVD risk score (Arnett DK, et al., 2019) is: 15.2% ? ?  ?Assessment & Plan:  ? ?Problem List Items Addressed This Visit   ? ?  ? Cardiovascular and Mediastinum  ? Hypertension  ? Relevant Orders  ? HgB A1c  ? Basic Metabolic Panel (BMET)  ?  ? Endocrine  ? Diabetes mellitus without complication ([place]) - Primary  ? Relevant Medications  ? glipiZIDE (GLUCOTROL) 5 MG tablet  ? Other Relevant Orders  ? HgB A1c  ? Basic Metabolic Panel (BMET)  ? ?Other Visit Diagnoses   ? ? Kidney dysfunction      ? Relevant Orders  ?  HgB A1c  ? Basic Metabolic Panel (BMET)  ? ?  ? ? ?Return in about 3 months (around 09/18/2021) for Follow-up with Liyla Radliff.  ? ? ?Ailene Ards, NP ? ?

## 2021-06-24 ENCOUNTER — Encounter: Payer: Self-pay | Admitting: Nurse Practitioner

## 2021-06-24 ENCOUNTER — Other Ambulatory Visit: Payer: Self-pay | Admitting: Nurse Practitioner

## 2021-06-24 ENCOUNTER — Telehealth: Payer: Self-pay | Admitting: Nurse Practitioner

## 2021-06-24 ENCOUNTER — Other Ambulatory Visit: Payer: Self-pay

## 2021-06-24 DIAGNOSIS — I10 Essential (primary) hypertension: Secondary | ICD-10-CM

## 2021-06-24 DIAGNOSIS — E119 Type 2 diabetes mellitus without complications: Secondary | ICD-10-CM

## 2021-06-24 MED ORDER — LISINOPRIL-HYDROCHLOROTHIAZIDE 20-25 MG PO TABS: 1.0000 | ORAL_TABLET | Freq: Every day | ORAL | 0 refills | Status: DC

## 2021-06-24 NOTE — Telephone Encounter (Signed)
An associate with [facility] calls today in regards to a medication refill for Karl Atkins. Karl Atkins is currently at [facility] and was attempting to get a refill on their Lisinoprile-hydrochlorothiazide 20-25 mg. It shows non-refillable on the bottle but the associate at the pharmacy believed maybe a new script could be put in? If possible they would like this script sent out to [facility], [address], [place]. The associate also left their phone number if needed ? ?CB: [phone] ?

## 2021-06-24 NOTE — Telephone Encounter (Signed)
Will you look into the CT scan that was ordered for this patient in March? I do not see that it has been scheduled and patient has not heard about scheduling as of yet.  ?

## 2021-06-25 NOTE — Telephone Encounter (Signed)
Patient made aware the medication was sent in ? ?

## 2021-07-20 ENCOUNTER — Encounter: Payer: Self-pay | Admitting: Nurse Practitioner

## 2021-07-20 ENCOUNTER — Ambulatory Visit (INDEPENDENT_AMBULATORY_CARE_PROVIDER_SITE_OTHER): Payer: BC Managed Care – PPO | Admitting: Nurse Practitioner

## 2021-07-20 VITALS — BP 166/100 | HR 65 | Temp 98.3°F | Ht 73.0 in | Wt 231.2 lb

## 2021-07-20 DIAGNOSIS — R051 Acute cough: Secondary | ICD-10-CM | POA: Diagnosis not present

## 2021-07-20 LAB — POC COVID19 BINAXNOW: SARS Coronavirus 2 Ag: NEGATIVE

## 2021-07-20 MED ORDER — ALBUTEROL SULFATE HFA 108 (90 BASE) MCG/ACT IN AERS: 2.0000 | INHALATION_SPRAY | Freq: Four times a day (QID) | RESPIRATORY_TRACT | 2 refills | Status: DC | PRN

## 2021-07-20 MED ORDER — BENZONATATE 200 MG PO CAPS: 200.0000 mg | ORAL_CAPSULE | Freq: Two times a day (BID) | ORAL | 0 refills | Status: DC | PRN

## 2021-07-20 NOTE — Progress Notes (Signed)
   Established Patient Office Visit  Subjective   Patient ID: Karl Atkins, male    DOB: [birth year]  Age: 56 y.o. MRN: [id number]  Chief Complaint  Patient presents with   Cough    Cough This is a new problem. Episode onset: 3 days. The problem has been gradually improving. The cough is Productive of purulent sputum. Associated symptoms include wheezing. Pertinent negatives include no chest pain, fever, headaches, nasal congestion, postnasal drip, sore throat, shortness of breath or sweats. The symptoms are aggravated by lying down. Treatments tried: mucinex. The treatment provided mild relief.     Review of Systems  Constitutional:  Negative for fever.  HENT:  Negative for postnasal drip and sore throat.   Respiratory:  Positive for cough and wheezing. Negative for shortness of breath.   Cardiovascular:  Negative for chest pain.  Neurological:  Negative for headaches.     Objective:     BP (!) 166/100 (BP Location: Left Arm, Patient Position: Sitting, Cuff Size: Large)   Pulse 65   Temp 98.3 F (36.8 C) (Oral)   Ht '6\' 1"'$  (1.854 m)   Wt 231 lb 3.2 oz (104.9 kg)   SpO2 99%   BMI 30.50 kg/m    Physical Exam Vitals reviewed.  Constitutional:      Appearance: Normal appearance.  HENT:     Head: Normocephalic and atraumatic.  Cardiovascular:     Rate and Rhythm: Normal rate and regular rhythm.  Pulmonary:     Effort: Pulmonary effort is normal.     Breath sounds: Rhonchi (clears with cough) present.  Musculoskeletal:     Cervical back: Neck supple.  Skin:    General: Skin is warm and dry.  Neurological:     Mental Status: He is alert and oriented to person, place, and time.  Psychiatric:        Mood and Affect: Mood normal.        Behavior: Behavior normal.        Thought Content: Thought content normal.        Judgment: Judgment normal.     No results found for any visits on 07/20/21.    The 10-year ASCVD risk score (Arnett DK, et al., 2019) is: 20.1%     Assessment & Plan:  1.  Cough: Likely viral upper respiratory infection.  Recommend treating symptomatically with Mucinex DM, as needed Tessalon Perles, as needed albuterol inhaler.  No clinical signs of pneumonia noted on exam today.  Recommend patient notify me if symptoms worsen especially if he experiences fever or shortness of breath.  He reports his understanding.  He will follow-up as scheduled later this month.  Blood pressure was elevated today, however patient does not feel well we will not make changes to antihypertensive today, but if it remains elevated later this month we will need to discuss adjusting antihypertensive. Problem List Items Addressed This Visit   None Visit Diagnoses     Acute cough    -  Primary   Relevant Medications   benzonatate (TESSALON) 200 MG capsule   albuterol (VENTOLIN HFA) 108 (90 Base) MCG/ACT inhaler   Other Relevant Orders   POC COVID-19       Return if symptoms worsen or fail to improve.    Ailene Ards, NP

## 2021-07-20 NOTE — Patient Instructions (Addendum)
Mucinex DM per package instructions, Tessalon pearls '200mg'$  by mouth twice a day as needed.   Call if symptoms worsen or do not improve in 7 days.

## 2021-08-02 DIAGNOSIS — E1122 Type 2 diabetes mellitus with diabetic chronic kidney disease: Secondary | ICD-10-CM | POA: Diagnosis not present

## 2021-08-02 DIAGNOSIS — N1831 Chronic kidney disease, stage 3a: Secondary | ICD-10-CM | POA: Diagnosis not present

## 2021-08-02 DIAGNOSIS — I129 Hypertensive chronic kidney disease with stage 1 through stage 4 chronic kidney disease, or unspecified chronic kidney disease: Secondary | ICD-10-CM | POA: Diagnosis not present

## 2021-08-06 ENCOUNTER — Ambulatory Visit (HOSPITAL_COMMUNITY)
Admission: RE | Admit: 2021-08-06 | Discharge: 2021-08-06 | Disposition: A | Discharge status: Home / Self Care | Payer: BC Managed Care – PPO | Source: Ambulatory Visit | Attending: Nurse Practitioner | Admitting: Nurse Practitioner

## 2021-08-06 ENCOUNTER — Ambulatory Visit (INDEPENDENT_AMBULATORY_CARE_PROVIDER_SITE_OTHER): Payer: BC Managed Care – PPO | Admitting: Nurse Practitioner

## 2021-08-06 VITALS — BP 152/90 | HR 72 | Temp 98.3°F | Ht 73.0 in | Wt 230.0 lb

## 2021-08-06 DIAGNOSIS — R109 Unspecified abdominal pain: Secondary | ICD-10-CM | POA: Diagnosis not present

## 2021-08-06 DIAGNOSIS — R1084 Generalized abdominal pain: Secondary | ICD-10-CM | POA: Insufficient documentation

## 2021-08-06 DIAGNOSIS — I10 Essential (primary) hypertension: Secondary | ICD-10-CM

## 2021-08-06 DIAGNOSIS — E1165 Type 2 diabetes mellitus with hyperglycemia: Secondary | ICD-10-CM | POA: Diagnosis not present

## 2021-08-06 DIAGNOSIS — I700 Atherosclerosis of aorta: Secondary | ICD-10-CM | POA: Diagnosis not present

## 2021-08-06 DIAGNOSIS — R1011 Right upper quadrant pain: Secondary | ICD-10-CM | POA: Diagnosis not present

## 2021-08-06 MED ORDER — OLMESARTAN MEDOXOMIL-HCTZ 20-12.5 MG PO TABS: 1.0000 | ORAL_TABLET | Freq: Every day | ORAL | 0 refills | Status: DC

## 2021-08-13 ENCOUNTER — Ambulatory Visit (INDEPENDENT_AMBULATORY_CARE_PROVIDER_SITE_OTHER): Payer: BC Managed Care – PPO | Admitting: Nurse Practitioner

## 2021-08-13 VITALS — BP 130/82 | HR 73 | Temp 98.4°F | Ht 73.0 in | Wt 229.0 lb

## 2021-08-13 DIAGNOSIS — I10 Essential (primary) hypertension: Secondary | ICD-10-CM | POA: Diagnosis not present

## 2021-08-13 DIAGNOSIS — R21 Rash and other nonspecific skin eruption: Secondary | ICD-10-CM | POA: Diagnosis not present

## 2021-08-13 MED ORDER — TRIAMCINOLONE ACETONIDE 0.1 % EX CREA: 1.0000 | TOPICAL_CREAM | Freq: Two times a day (BID) | CUTANEOUS | 0 refills | Status: DC

## 2021-08-13 NOTE — Progress Notes (Signed)
Established Patient Office Visit  Subjective   Patient ID: Karl Atkins, male    DOB: [birth year]  Age: 56 y.o. MRN: [id number]  Chief Complaint  Patient presents with   1wk follow up    HPI - Hypertension - Patient denies CP, SOB, dizziness, headache, or blurry vision. He states that he has been checking his BP and they have all been 056'P systolic and feels good. Started on olmesarten-hctz 20-12.45m/day at last office visit. Patient tolerating well.  - Rash - Patient states that he has had a small rash on his abdomen for 7-8 weeks now. He says it does not itch and is not painful. He has tried triple antibiotic ointment and lavender with no improvement.     Review of Systems  Constitutional:  Negative for fever.  HENT:  Negative for congestion.   Eyes:  Negative for blurred vision and double vision.  Respiratory:  Negative for cough.   Cardiovascular:  Negative for chest pain and leg swelling.  Gastrointestinal:  Negative for constipation, diarrhea, nausea and vomiting.  Skin:  Positive for rash.  Neurological:  Negative for dizziness and headaches.  Psychiatric/Behavioral:  Negative for depression. The patient is not nervous/anxious.       Objective:     BP 130/82 (BP Location: Right Arm, Patient Position: Sitting, Cuff Size: Large)   Pulse 73   Temp 98.4 F (36.9 C) (Oral)   Ht '6\' 1"'  (1.854 m)   Wt 229 lb (103.9 kg)   SpO2 96%   BMI 30.21 kg/m  BP Readings from Last 3 Encounters:  08/13/21 130/82  08/06/21 (!) 152/90  07/20/21 (!) 166/100   Wt Readings from Last 3 Encounters:  08/13/21 229 lb (103.9 kg)  08/06/21 230 lb (104.3 kg)  07/20/21 231 lb 3.2 oz (104.9 kg)      Physical Exam Vitals reviewed.  Constitutional:      Appearance: Normal appearance.  HENT:     Head: Normocephalic.  Cardiovascular:     Rate and Rhythm: Normal rate.  Pulmonary:     Effort: Pulmonary effort is normal.     Breath sounds: Normal breath sounds.  Skin:    General:  Skin is warm.     Findings: Erythema and rash present. Rash is scaling.       Neurological:     Mental Status: He is alert and oriented to person, place, and time.  Psychiatric:        Mood and Affect: Mood normal.        Behavior: Behavior normal.      No results found for any visits on 08/13/21.  Last metabolic panel Lab Results  Component Value Date   GLUCOSE 191 (H) 06/18/2021   NA 137 06/18/2021   K 4.7 06/18/2021   CL 101 06/18/2021   CO2 31 06/18/2021   BUN 26 (H) 06/18/2021   CREATININE 1.66 (H) 06/18/2021   EGFR 71 02/19/2021   CALCIUM 9.7 06/18/2021   PROT 6.4 06/04/2021   ALBUMIN 4.0 06/04/2021   LABGLOB 2.1 02/19/2021   AGRATIO 2.0 02/19/2021   BILITOT 0.6 06/04/2021   ALKPHOS 31 (L) 06/04/2021   AST 22 06/04/2021   ALT 16 06/04/2021   ANIONGAP 8 03/11/2020      The 10-year ASCVD risk score (Arnett DK, et al., 2019) is: 13.4%    Assessment & Plan:   Problem List Items Addressed This Visit       Cardiovascular and Mediastinum   Hypertension - Primary  Chronic, BP at goal today with recent medication change. Will order CMP today and check kidney function. FU in 3 months      Relevant Orders   Comprehensive metabolic panel     Musculoskeletal and Integument   Rash    Rash on abdomen. Will treat with mild steroid (triamsimolone) apply twice daily and if it doesn't go away in 2 weeks will make an appointment with Dermatology. Educated patient that if it goes away and comes back to notify myself and Dermatology as well.       Relevant Medications   triamcinolone cream (KENALOG) 0.1 %    Return in about 3 months (around 11/13/2021).    Glade Nurse, RN

## 2021-08-13 NOTE — Assessment & Plan Note (Signed)
Chronic, BP at goal today with recent medication change. Will order CMP today and check kidney function. FU in 3 months

## 2021-08-13 NOTE — Assessment & Plan Note (Signed)
Rash on abdomen. Will treat with mild steroid (triamsimolone) apply twice daily and if it doesn't go away in 2 weeks will make an appointment with Dermatology. Educated patient that if it goes away and comes back to notify myself and Dermatology as well.

## 2021-08-14 LAB — COMPREHENSIVE METABOLIC PANEL
AG Ratio: 1.9 (calc) (ref 1.0–2.5)
ALT: 13 U/L (ref 9–46)
AST: 18 U/L (ref 10–35)
Albumin: 4.4 g/dL (ref 3.6–5.1)
Alkaline phosphatase (APISO): 25 U/L — ABNORMAL LOW (ref 35–144)
BUN/Creatinine Ratio: 16 (calc) (ref 6–22)
BUN: 26 mg/dL — ABNORMAL HIGH (ref 7–25)
CO2: 23 mmol/L (ref 20–32)
Calcium: 9.5 mg/dL (ref 8.6–10.3)
Chloride: 105 mmol/L (ref 98–110)
Creat: 1.6 mg/dL — ABNORMAL HIGH (ref 0.70–1.30)
Globulin: 2.3 g/dL (calc) (ref 1.9–3.7)
Glucose, Bld: 113 mg/dL — ABNORMAL HIGH (ref 65–99)
Potassium: 4.4 mmol/L (ref 3.5–5.3)
Sodium: 137 mmol/L (ref 135–146)
Total Bilirubin: 0.7 mg/dL (ref 0.2–1.2)
Total Protein: 6.7 g/dL (ref 6.1–8.1)

## 2021-08-27 ENCOUNTER — Other Ambulatory Visit: Payer: Self-pay | Admitting: Nurse Practitioner

## 2021-08-27 ENCOUNTER — Encounter: Payer: Self-pay | Admitting: Nurse Practitioner

## 2021-08-27 DIAGNOSIS — E119 Type 2 diabetes mellitus without complications: Secondary | ICD-10-CM

## 2021-08-27 DIAGNOSIS — I10 Essential (primary) hypertension: Secondary | ICD-10-CM

## 2021-08-27 MED ORDER — GLIPIZIDE 5 MG PO TABS: 5.0000 mg | ORAL_TABLET | Freq: Every day | ORAL | 2 refills | Status: DC

## 2021-09-03 ENCOUNTER — Telehealth: Payer: Self-pay | Admitting: Nurse Practitioner

## 2021-09-03 NOTE — Telephone Encounter (Signed)
Please call this patient and ask if his rash has improved. If he states that it has not improved then I will refer him to dermatology for further evaluation.  Thank you!

## 2021-09-03 NOTE — Telephone Encounter (Signed)
LDVM for pt to call the clinic back with an update on his rash and if he is needing the dermatology referral.

## 2021-09-03 NOTE — Telephone Encounter (Signed)
Pt returned call to office and states he is not having problems with the rash.   Call back with any questions.

## 2021-09-08 ENCOUNTER — Other Ambulatory Visit: Payer: Self-pay | Admitting: Nurse Practitioner

## 2021-09-08 DIAGNOSIS — E119 Type 2 diabetes mellitus without complications: Secondary | ICD-10-CM

## 2021-09-10 ENCOUNTER — Encounter: Payer: Self-pay | Admitting: Nurse Practitioner

## 2021-09-10 ENCOUNTER — Other Ambulatory Visit: Payer: Self-pay | Admitting: Nurse Practitioner

## 2021-09-10 DIAGNOSIS — I10 Essential (primary) hypertension: Secondary | ICD-10-CM

## 2021-09-10 MED ORDER — OLMESARTAN MEDOXOMIL-HCTZ 20-12.5 MG PO TABS: 1.0000 | ORAL_TABLET | Freq: Every day | ORAL | 0 refills | Status: DC

## 2021-09-13 MED ORDER — OLMESARTAN MEDOXOMIL-HCTZ 20-12.5 MG PO TABS: 1.0000 | ORAL_TABLET | Freq: Every day | ORAL | 1 refills | Status: DC

## 2021-09-13 MED ORDER — METOPROLOL SUCCINATE ER 25 MG PO TB24: ORAL_TABLET | ORAL | 1 refills | Status: DC

## 2021-10-15 ENCOUNTER — Telehealth: Payer: Self-pay | Admitting: Nurse Practitioner

## 2021-10-15 DIAGNOSIS — E119 Type 2 diabetes mellitus without complications: Secondary | ICD-10-CM

## 2021-10-15 NOTE — Telephone Encounter (Signed)
Glipizide 5 mg - please send to [facility]  Patient also needs metformin sent.

## 2021-10-20 MED ORDER — METFORMIN HCL 1000 MG PO TABS: 1000.0000 mg | ORAL_TABLET | Freq: Two times a day (BID) | ORAL | 1 refills | Status: DC

## 2021-10-20 MED ORDER — GLIPIZIDE 5 MG PO TABS: 5.0000 mg | ORAL_TABLET | Freq: Every day | ORAL | 2 refills | Status: DC

## 2021-10-20 NOTE — Telephone Encounter (Signed)
Medication per pt request is sent in to pharmacy of their choice

## 2021-11-18 ENCOUNTER — Ambulatory Visit (INDEPENDENT_AMBULATORY_CARE_PROVIDER_SITE_OTHER): Payer: BC Managed Care – PPO | Admitting: Nurse Practitioner

## 2021-11-18 VITALS — BP 142/78 | HR 74 | Temp 98.6°F | Ht 73.0 in | Wt 230.1 lb

## 2021-11-18 DIAGNOSIS — E1165 Type 2 diabetes mellitus with hyperglycemia: Secondary | ICD-10-CM | POA: Diagnosis not present

## 2021-11-18 DIAGNOSIS — I10 Essential (primary) hypertension: Secondary | ICD-10-CM | POA: Diagnosis not present

## 2021-11-18 DIAGNOSIS — E559 Vitamin D deficiency, unspecified: Secondary | ICD-10-CM | POA: Insufficient documentation

## 2021-11-18 NOTE — Assessment & Plan Note (Signed)
Chronic, recommend checking A1c and lipid panel today.  Patient asking to have this drawn at next office visit and he will focus significantly on improving his lifestyle.  He was encouraged to reduce intake of sweets and instead focus on eating lean proteins and vegetables.  Patient reports understanding.  Plan to check A1c and lipid panel at next office visit.

## 2021-11-18 NOTE — Assessment & Plan Note (Signed)
Chronic, slightly above goal today however has been better at earlier visits.  For now he will continue on current regimen of metoprolol 12 5 mg/day and olmesartan-hydrochlorothiazide 20-12.5 mg/day.  Follow-up in 3 months.

## 2021-11-18 NOTE — Progress Notes (Signed)
Established Patient Office Visit  Subjective   Patient ID: Karl Atkins, male    DOB: [birth year]  Age: 56 y.o. MRN: [id number]  Chief Complaint  Patient presents with   Follow-up   Hypertension    Patient for follow-up with his chronic conditions.  Hypertension: Continues on metoprolol 25 mg/day and olmesartan-hydrochlorothiazide 20-12.5 mg/day.  Tolerating well.  Type 2 diabetes: Last A1c 9.1, due to have this checked today.  Currently on glipizide 5 mg daily, metformin 1000 mg twice a day.  He has poor drug coverage with his insurance, we have tried different medications in the past but he has been unable to obtain due to cost.  Reports that he has been snacking a little bit more at night lately.  He is on ARB, he does have macroalbuminuria and follows up with nephrology routinely.  He is not on statin therapy but he is on fenofibrate.      Review of Systems  Eyes:  Negative for blurred vision.  Respiratory:  Negative for shortness of breath.   Cardiovascular:  Negative for chest pain.  Neurological:  Negative for headaches.      Objective:     BP (!) 142/78   Pulse 74   Temp 98.6 F (37 C) (Oral)   Ht '6\' 1"'$  (1.854 m)   Wt 230 lb 2 oz (104.4 kg)   SpO2 98%   BMI 30.36 kg/m  BP Readings from Last 3 Encounters:  11/18/21 (!) 142/78  08/13/21 130/82  08/06/21 (!) 152/90   Wt Readings from Last 3 Encounters:  11/18/21 230 lb 2 oz (104.4 kg)  08/13/21 229 lb (103.9 kg)  08/06/21 230 lb (104.3 kg)      Physical Exam Vitals reviewed.  Constitutional:      Appearance: Normal appearance.  HENT:     Head: Normocephalic and atraumatic.  Cardiovascular:     Rate and Rhythm: Normal rate and regular rhythm.  Pulmonary:     Effort: Pulmonary effort is normal.     Breath sounds: Normal breath sounds.  Musculoskeletal:     Cervical back: Neck supple.  Skin:    General: Skin is warm and dry.  Neurological:     Mental Status: He is alert and oriented to person,  place, and time.  Psychiatric:        Mood and Affect: Mood normal.        Behavior: Behavior normal.        Thought Content: Thought content normal.        Judgment: Judgment normal.      No results found for any visits on 11/18/21.    The 10-year ASCVD risk score (Arnett DK, et al., 2019) is: 15.6%    Assessment & Plan:   Problem List Items Addressed This Visit       Cardiovascular and Mediastinum   Hypertension - Primary    Chronic, slightly above goal today however has been better at earlier visits.  For now he will continue on current regimen of metoprolol 12 5 mg/day and olmesartan-hydrochlorothiazide 20-12.5 mg/day.  Follow-up in 3 months.        Endocrine   Type 2 diabetes mellitus with hyperglycemia (HCC)    Chronic, recommend checking A1c and lipid panel today.  Patient asking to have this drawn at next office visit and he will focus significantly on improving his lifestyle.  He was encouraged to reduce intake of sweets and instead focus on eating lean proteins and vegetables.  Patient reports understanding.  Plan to check A1c and lipid panel at next office visit.       Return in about 3 months (around 02/18/2022) for f/u with Judson Roch.    Ailene Ards, NP

## 2021-12-13 DIAGNOSIS — E1122 Type 2 diabetes mellitus with diabetic chronic kidney disease: Secondary | ICD-10-CM | POA: Diagnosis not present

## 2021-12-13 DIAGNOSIS — I129 Hypertensive chronic kidney disease with stage 1 through stage 4 chronic kidney disease, or unspecified chronic kidney disease: Secondary | ICD-10-CM | POA: Diagnosis not present

## 2021-12-13 DIAGNOSIS — N1831 Chronic kidney disease, stage 3a: Secondary | ICD-10-CM | POA: Diagnosis not present

## 2021-12-14 LAB — LAB REPORT - SCANNED: EGFR: 50

## 2022-02-03 ENCOUNTER — Ambulatory Visit (INDEPENDENT_AMBULATORY_CARE_PROVIDER_SITE_OTHER): Payer: BC Managed Care – PPO | Admitting: Nurse Practitioner

## 2022-02-03 VITALS — BP 146/82 | HR 80 | Temp 98.3°F | Ht 73.0 in | Wt 233.4 lb

## 2022-02-03 DIAGNOSIS — S81801A Unspecified open wound, right lower leg, initial encounter: Secondary | ICD-10-CM | POA: Diagnosis not present

## 2022-02-03 DIAGNOSIS — L089 Local infection of the skin and subcutaneous tissue, unspecified: Secondary | ICD-10-CM | POA: Insufficient documentation

## 2022-02-03 MED ORDER — CEPHALEXIN 500 MG PO CAPS: 500.0000 mg | ORAL_CAPSULE | Freq: Four times a day (QID) | ORAL | 0 refills | Status: DC

## 2022-02-03 MED ORDER — DOXYCYCLINE HYCLATE 100 MG PO TABS: 100.0000 mg | ORAL_TABLET | Freq: Two times a day (BID) | ORAL | 0 refills | Status: DC

## 2022-02-03 NOTE — Assessment & Plan Note (Signed)
Acute, patient has uncontrolled diabetes as well.  Plan to treat with course of doxycycline and cephalexin.  Patient to follow-up as scheduled in 2 weeks for close monitoring, or sooner as needed.

## 2022-02-03 NOTE — Progress Notes (Signed)
Established Patient Office Visit  Subjective   Patient ID: Karl Atkins, male    DOB: [birth year]  Age: 56 y.o. MRN: [id number]  Chief Complaint  Patient presents with   Wound Infection    Patient has today for the above.  Reports that 2 to 3 weeks ago a plastic yard sign fell out of his truck and hit his right medial lower calf.  Since then he has had a wound that has just not healed.  He has been using triple antibiotic ointment as well as Band-Aids.  He also is requesting that his disability paperwork be filled out.  This is from an accident where he fell down stairs in 2013.  At that time Dr. Carloyn Manner performed back surgery related to evidence of pinched S1 and bulging L4-L5.  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  He reports that he used to work in a funeral home and then after the injury he was no longer able to work there due to not being able to stand for long periods of time.  He reports that his symptoms remain unchanged and he still is unable to stand for prolonged periods of time.    ROS: See HPI    Objective:     BP (!) 146/82   Pulse 80   Temp 98.3 F (36.8 C) (Oral)   Ht '6\' 1"'$  (1.854 m)   Wt 233 lb 6 oz (105.9 kg)   SpO2 95%   BMI 30.79 kg/m  BP Readings from Last 3 Encounters:  02/03/22 (!) 146/82  11/18/21 (!) 142/78  08/13/21 130/82   Wt Readings from Last 3 Encounters:  02/03/22 233 lb 6 oz (105.9 kg)  11/18/21 230 lb 2 oz (104.4 kg)  08/13/21 229 lb (103.9 kg)      Physical Exam Vitals reviewed.  Constitutional:      Appearance: Normal appearance.  HENT:     Head: Normocephalic and atraumatic.  Cardiovascular:     Rate and Rhythm: Normal rate and regular rhythm.  Pulmonary:     Effort: Pulmonary effort is normal.     Breath sounds: Normal breath sounds.  Musculoskeletal:     Cervical back: Neck supple.  Skin:    General: Skin is warm  and dry.       Neurological:     Mental Status: He is alert and oriented to person, place, and time.  Psychiatric:        Mood and Affect: Mood normal.        Behavior: Behavior normal.        Thought Content: Thought content normal.        Judgment: Judgment normal.      No results found for any visits on 02/03/22.    The 10-year ASCVD risk score (Arnett DK, et al., 2019) is: 17.7%    Assessment & Plan:   Problem List Items Addressed This Visit       Other   Infected wound - Primary    Acute, patient has uncontrolled diabetes as well.  Plan to treat with course of doxycycline and cephalexin.  Patient to follow-up as scheduled in 2 weeks for close monitoring, or sooner as needed.      Relevant Medications   cephALEXin (KEFLEX) 500 MG capsule   doxycycline (VIBRA-TABS) 100 MG tablet   Disability form completed.  Return for  As scheduled.    Ailene Ards, NP

## 2022-02-18 ENCOUNTER — Ambulatory Visit (INDEPENDENT_AMBULATORY_CARE_PROVIDER_SITE_OTHER): Payer: BC Managed Care – PPO | Admitting: Nurse Practitioner

## 2022-02-18 VITALS — BP 162/74 | HR 82 | Temp 98.0°F | Ht 73.0 in | Wt 232.2 lb

## 2022-02-18 DIAGNOSIS — R21 Rash and other nonspecific skin eruption: Secondary | ICD-10-CM | POA: Diagnosis not present

## 2022-02-18 DIAGNOSIS — R1084 Generalized abdominal pain: Secondary | ICD-10-CM | POA: Diagnosis not present

## 2022-02-18 DIAGNOSIS — I10 Essential (primary) hypertension: Secondary | ICD-10-CM

## 2022-02-18 DIAGNOSIS — E1165 Type 2 diabetes mellitus with hyperglycemia: Secondary | ICD-10-CM

## 2022-02-18 MED ORDER — MUPIROCIN 2 % EX OINT: TOPICAL_OINTMENT | CUTANEOUS | 0 refills | Status: DC

## 2022-02-18 MED ORDER — OLMESARTAN MEDOXOMIL-HCTZ 40-12.5 MG PO TABS: 1.0000 | ORAL_TABLET | Freq: Every day | ORAL | 3 refills | Status: DC

## 2022-02-18 NOTE — Assessment & Plan Note (Signed)
Chronic, patient will proceed to lab next week to have A1c checked.  In the meantime he will continue glipizide 5 mg daily, metformin 1000 mg twice a day and ARB therapy.

## 2022-02-18 NOTE — Assessment & Plan Note (Signed)
Wound does look much improved compared to last time he was seen in the office.  He will continue using mupirocin cream topically.  Call office if signs or symptoms of infection occur.

## 2022-02-18 NOTE — Assessment & Plan Note (Signed)
Etiology remains unclear, will consider MRI of abdomen for further evaluation.  Patient will first verify which insurance is currently active as he was planning on changing insurance at the beginning of the year.  He will provide me with that information once he receives it we will try to order MRI at that time.

## 2022-02-18 NOTE — Progress Notes (Signed)
Established Patient Office Visit  Subjective   Patient ID: Karl Atkins, male    DOB: 1965-07-23  Age: 57 y.o. MRN: [id number]  Chief Complaint  Patient presents with   Diabetes    Type 2 diabetes/hypertension: Patient due to have A1c checked today.  On glipizide 5 mg daily, metformin 1000 mg twice a day.  Patient on olmesartan-hydrochlorothiazide and metoprolol for blood pressure control.  Patient is not on statin therapy currently.  Reports he does not check blood sugars regularly.  Also has chronic kidney disease and follows with nephrology for management of this.  Last A1c 9.1.  Right-sided flank/abdominal pain: Has been a chronic ongoing problem since August 2021.  Ultimately underwent cholecystectomy however pain has persisted despite this surgery.  Had abdominal and pelvic CT scan in June 2023 which showed no acute abnormality in abdomen or pelvis, there was a 2 mm nonobstructing stone in his left kidney.  Otherwise no etiology has been determined for his pain.  Pain is most often triggered by eating.  Massage therapy helps improve the pain.  Pain is intermittent.  Wound with delayed healing: He has a wound to his right ankle that had been delayed in healing.  He took course of antibiotics by mouth.  Reports wound is still present but does appear to have healed bit since last time I saw him.    Review of Systems  Respiratory:  Negative for shortness of breath.   Cardiovascular:  Negative for chest pain.  Gastrointestinal:  Positive for abdominal pain.      Objective:     BP (!) 162/74   Pulse 82   Temp 98 F (36.7 C) (Temporal)   Ht '6\' 1"'$  (1.854 m)   Wt 232 lb 4 oz (105.3 kg)   SpO2 96%   BMI 30.64 kg/m  BP Readings from Last 3 Encounters:  02/18/22 (!) 162/74  02/03/22 (!) 146/82  11/18/21 (!) 142/78   Wt Readings from Last 3 Encounters:  02/18/22 232 lb 4 oz (105.3 kg)  02/03/22 233 lb 6 oz (105.9 kg)  11/18/21 230 lb 2 oz (104.4 kg)      Physical  Exam Vitals reviewed.  Constitutional:      Appearance: Normal appearance.  HENT:     Head: Normocephalic and atraumatic.  Cardiovascular:     Rate and Rhythm: Normal rate and regular rhythm.  Pulmonary:     Effort: Pulmonary effort is normal.     Breath sounds: Normal breath sounds.  Musculoskeletal:     Cervical back: Neck supple.  Skin:    General: Skin is warm and dry.       Neurological:     Mental Status: He is alert and oriented to person, place, and time.  Psychiatric:        Mood and Affect: Mood normal.        Behavior: Behavior normal.        Thought Content: Thought content normal.        Judgment: Judgment normal.      No results found for any visits on 02/18/22.    The 10-year ASCVD risk score (Arnett DK, et al., 2019) is: 21%    Assessment & Plan:   Problem List Items Addressed This Visit       Cardiovascular and Mediastinum   Hypertension - Primary    Chronic above goal, will increase Benicar to 40-12.5 mg/tab, take 1 tablet by mouth daily. continue metoprolol 25 mg daily. He will  proceed to [facility] lab in approximately 1 week to have labs drawn including metabolic panel to monitor for stability of kidney function.  Follow-up in 1 month for blood pressure check.      Relevant Medications   olmesartan-hydrochlorothiazide (BENICAR HCT) 40-12.5 MG tablet   Other Relevant Orders   Basic metabolic panel     Endocrine   Type 2 diabetes mellitus with hyperglycemia (HCC)    Chronic, patient will proceed to lab next week to have A1c checked.  In the meantime he will continue glipizide 5 mg daily, metformin 1000 mg twice a day and ARB therapy.      Relevant Medications   olmesartan-hydrochlorothiazide (BENICAR HCT) 40-12.5 MG tablet   Other Relevant Orders   Hemoglobin A1c     Musculoskeletal and Integument   Rash    Wound does look much improved compared to last time he was seen in the office.  He will continue using mupirocin cream topically.  Call  office if signs or symptoms of infection occur.      Relevant Medications   mupirocin ointment (BACTROBAN) 2 %     Other   Abdominal pain    Etiology remains unclear, will consider MRI of abdomen for further evaluation.  Patient will first verify which insurance is currently active as he was planning on changing insurance at the beginning of the year.  He will provide me with that information once he receives it we will try to order MRI at that time.       Return in about 1 month (around 03/21/2022) for f/u with Judson Roch.    Ailene Ards, NP

## 2022-02-18 NOTE — Assessment & Plan Note (Addendum)
Chronic above goal, will increase Benicar to 40-12.5 mg/tab, take 1 tablet by mouth daily. continue metoprolol 25 mg daily. He will proceed to [facility] lab in approximately 1 week to have labs drawn including metabolic panel to monitor for stability of kidney function.  Follow-up in 1 month for blood pressure check.

## 2022-03-03 ENCOUNTER — Telehealth: Payer: Self-pay | Admitting: Nurse Practitioner

## 2022-03-03 NOTE — Telephone Encounter (Signed)
I was able to speak with the pt and inform him of Sarah's instructions. Pt states he understood and has no questions or concerns at this time.

## 2022-03-03 NOTE — Telephone Encounter (Signed)
Please call patient to remind him to go to lab to have his follow-up lab work drawn.  Labs are ordered through [facility], so he can go to a [facility] location near to his home.  If he prefers to come to our lab downstairs let me know so I can reorder the labs for them.  Thank you.

## 2022-03-19 IMAGING — DX DG CHEST 1V PORT
1 series · 1 of 1 positions shown · non-contrast
Comparison: Radiograph 01/28/2019

CLINICAL DATA: Chest pain, radiating to right upper quadrant,
intermittent for 3 weeks

EXAM:
PORTABLE CHEST 1 VIEW

[chest ap]
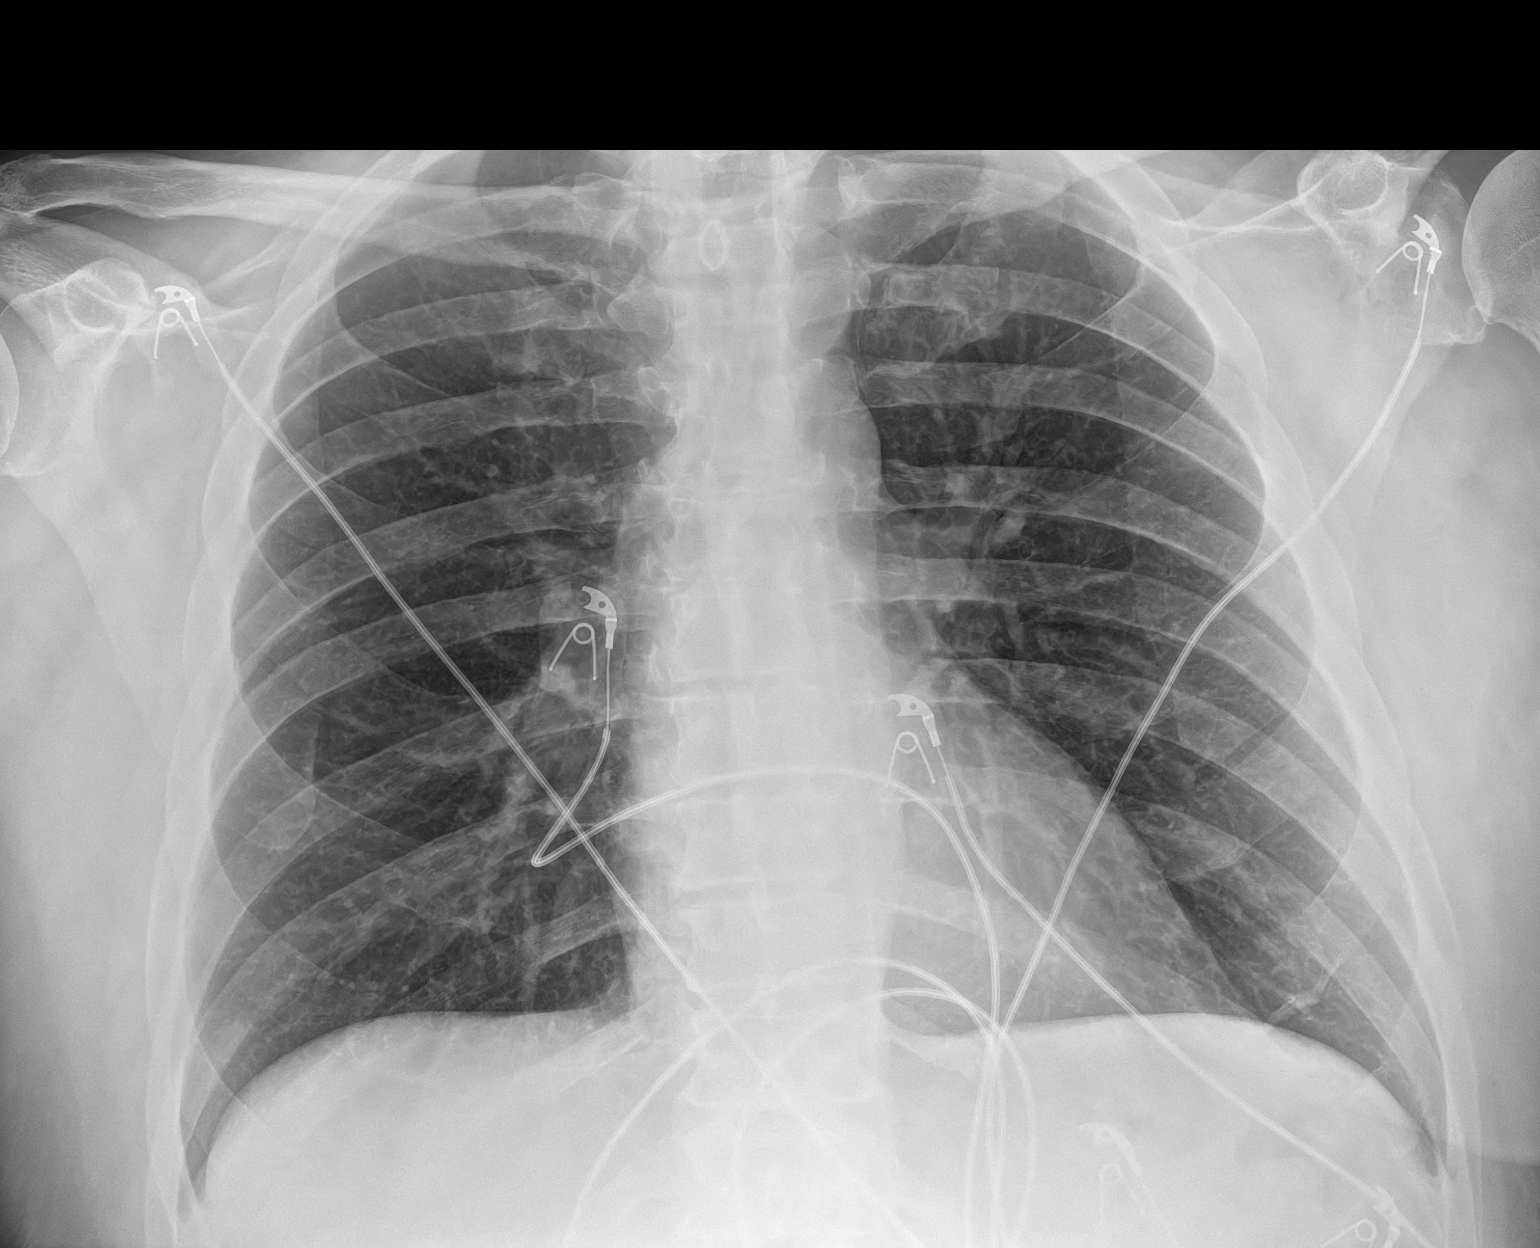

[1 of 1 positions shown; findings below may reference images not displayed]

FINDINGS: No consolidation, features of edema, pneumothorax, or effusion.
Pulmonary vascularity is normally distributed. The cardiomediastinal
contours are unremarkable. No acute osseous or soft tissue
abnormality. Telemetry leads overlie the chest.
IMPRESSION: No acute cardiopulmonary abnormality.

## 2022-03-19 IMAGING — US US ABDOMEN LIMITED
1 series · 14 of 25 positions shown · non-contrast
Comparison: CT scan of same day.

CLINICAL DATA: Acute right upper quadrant abdominal pain.

EXAM:
ULTRASOUND ABDOMEN LIMITED RIGHT UPPER QUADRANT

[Series 1: us abdomen limited ruq (liver/gb) · 14 of 47 slices shown]
[im 1/47]
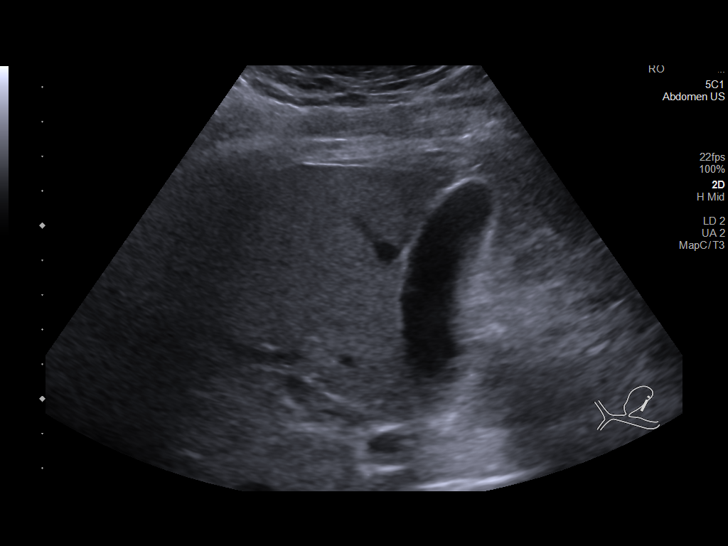
[im 4/47]
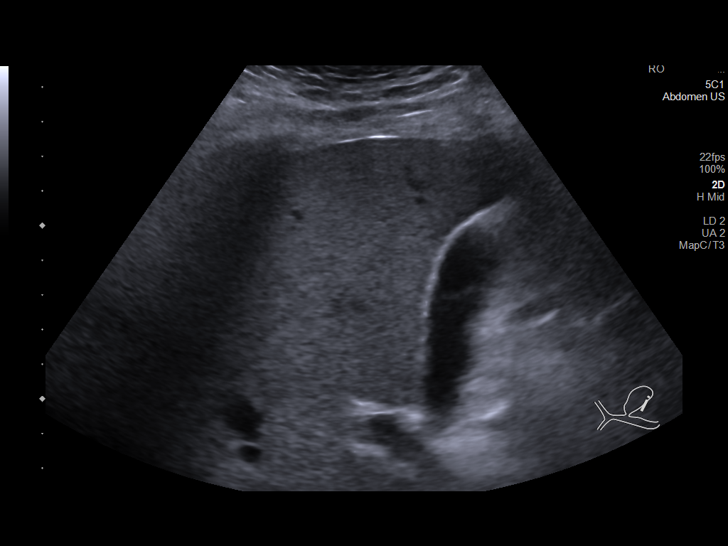
[im 8/47]
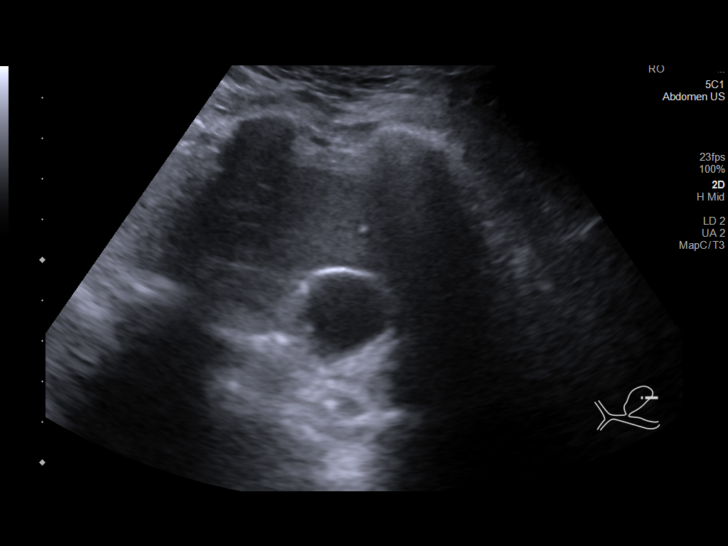
[im 12/47]
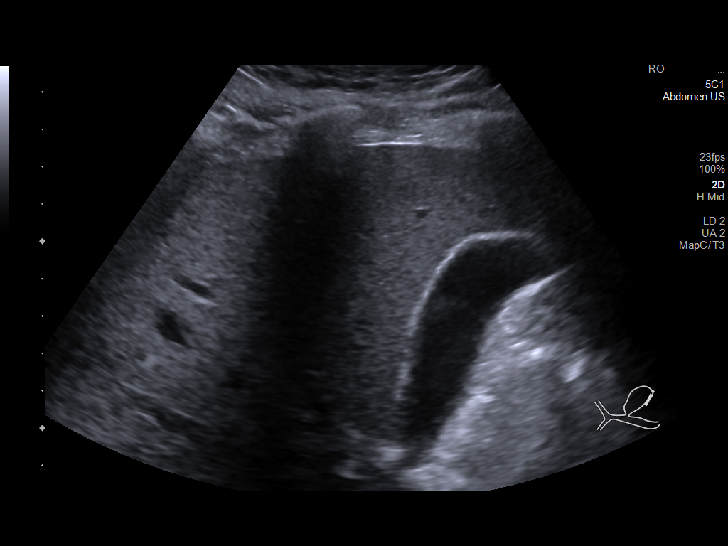
[im 16/47]
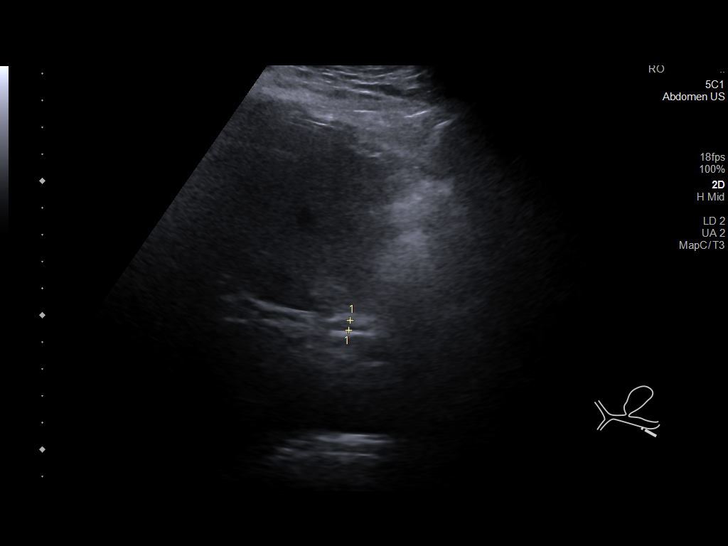
[im 18/47]
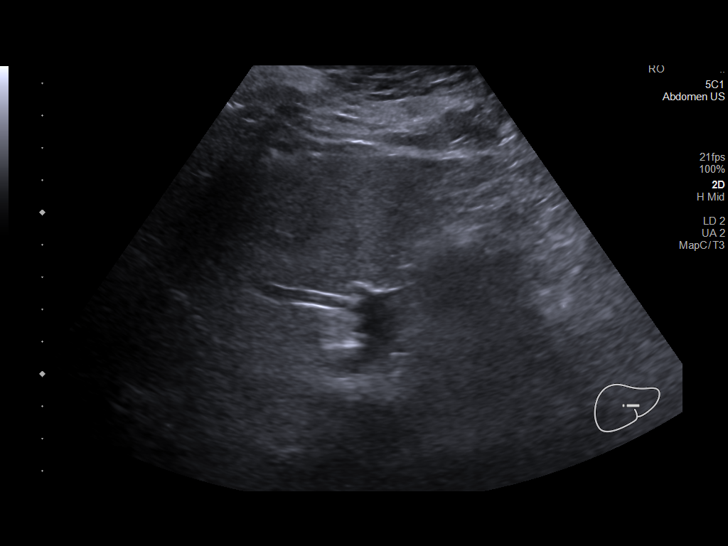
[im 22/47]
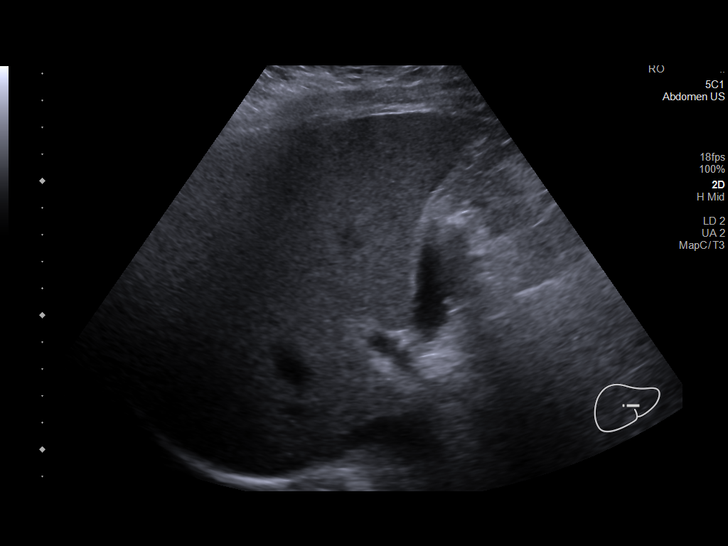
[im 25/47]
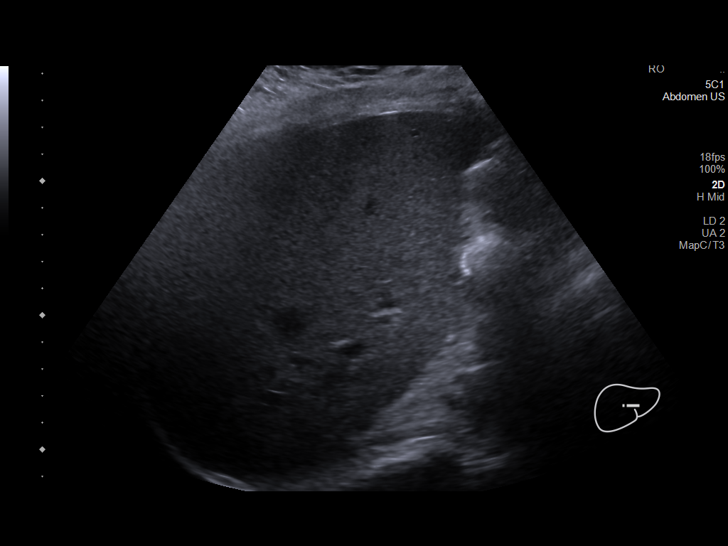
[im 29/47]
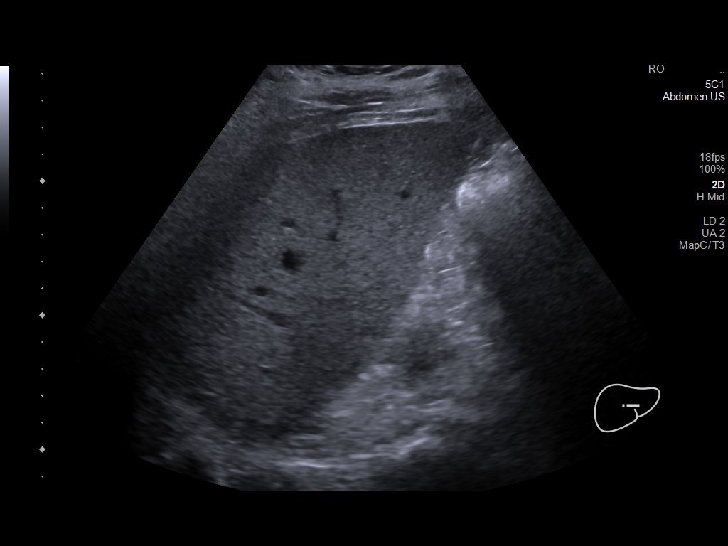
[im 31/47]
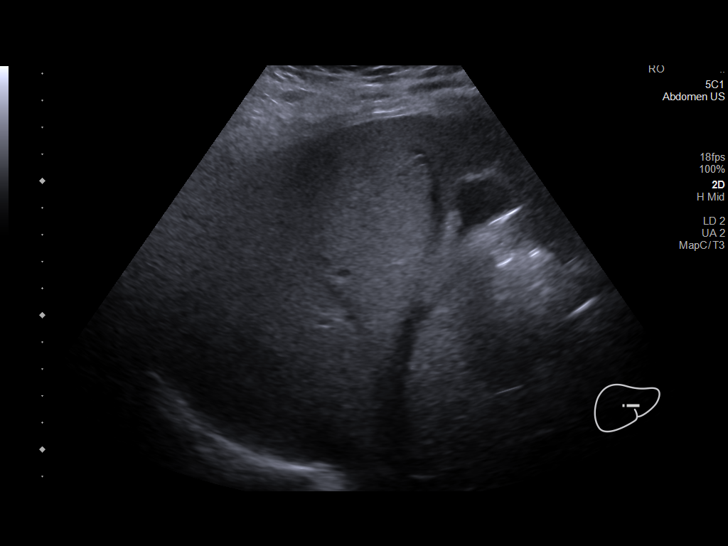
[im 35/47]
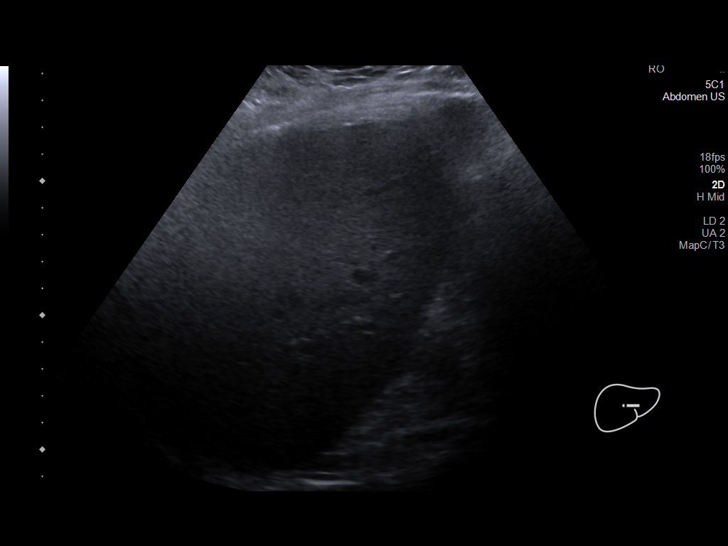
[im 39/47]
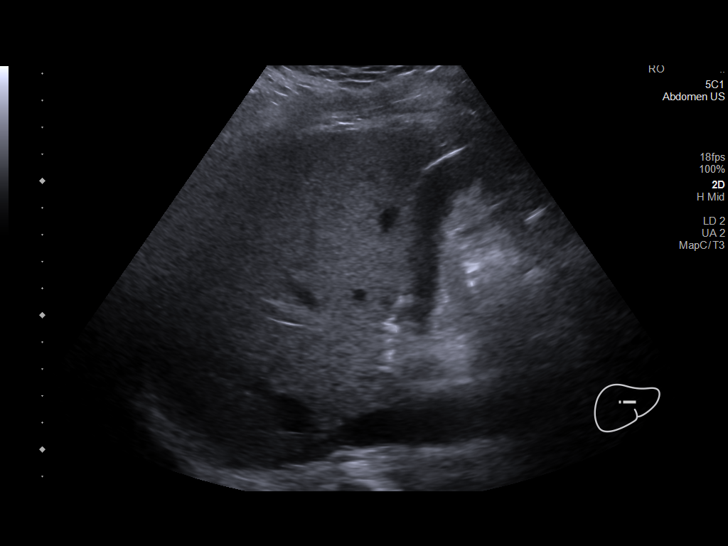
[im 43/47]
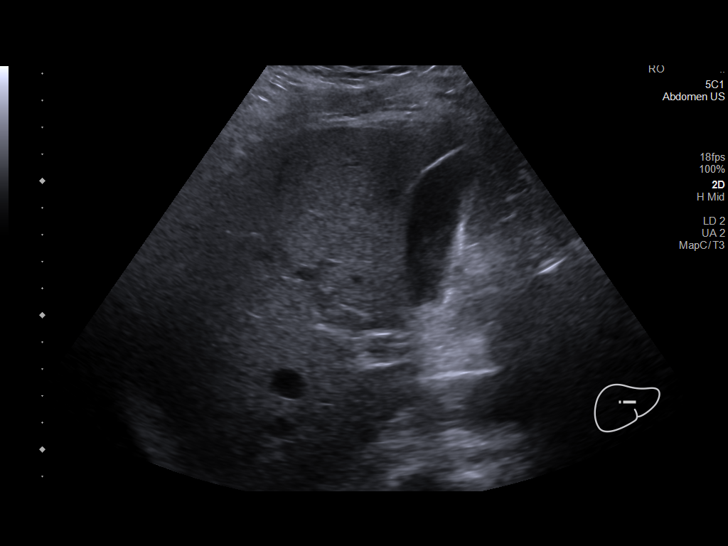
[im 47/47]
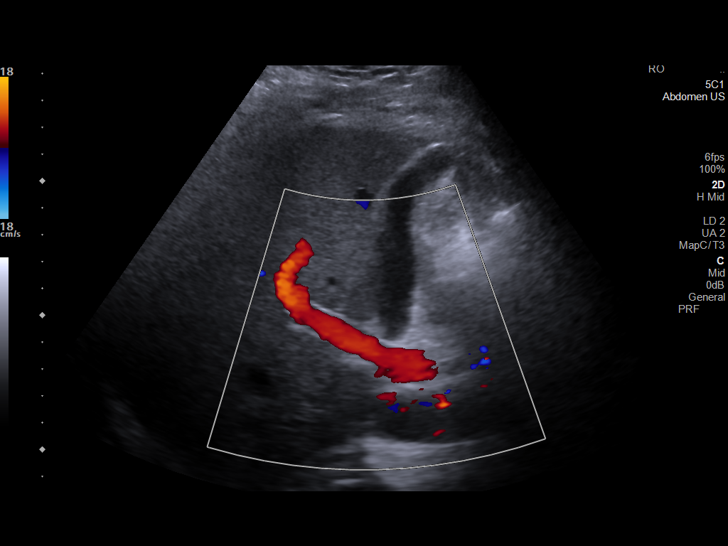

[14 of 25 positions shown; findings below may reference images not displayed]

FINDINGS: Gallbladder:

No gallstones or wall thickening visualized. No sonographic Murphy
sign noted by sonographer.

Common bile duct:

Diameter: 4 mm which is within normal limits.

Liver:

No focal lesion identified. Within normal limits in parenchymal
echogenicity. Portal vein is patent on color Doppler imaging with
normal direction of blood flow towards the liver.

Other: None.
IMPRESSION: No abnormality seen in the right upper quadrant of the abdomen.

## 2022-03-22 ENCOUNTER — Other Ambulatory Visit: Payer: Self-pay | Admitting: Nurse Practitioner

## 2022-03-22 DIAGNOSIS — I10 Essential (primary) hypertension: Secondary | ICD-10-CM

## 2022-04-07 ENCOUNTER — Encounter: Payer: Self-pay | Admitting: Nurse Practitioner

## 2022-04-14 ENCOUNTER — Ambulatory Visit: Payer: 59 | Admitting: Nurse Practitioner

## 2022-04-14 VITALS — BP 156/82 | HR 100 | Temp 98.3°F | Ht 73.0 in | Wt 234.2 lb

## 2022-04-14 DIAGNOSIS — L089 Local infection of the skin and subcutaneous tissue, unspecified: Secondary | ICD-10-CM | POA: Diagnosis not present

## 2022-04-14 DIAGNOSIS — T148XXA Other injury of unspecified body region, initial encounter: Secondary | ICD-10-CM | POA: Diagnosis not present

## 2022-04-14 DIAGNOSIS — E1165 Type 2 diabetes mellitus with hyperglycemia: Secondary | ICD-10-CM | POA: Diagnosis not present

## 2022-04-14 DIAGNOSIS — R1084 Generalized abdominal pain: Secondary | ICD-10-CM | POA: Diagnosis not present

## 2022-04-14 DIAGNOSIS — I10 Essential (primary) hypertension: Secondary | ICD-10-CM

## 2022-04-14 MED ORDER — CEPHALEXIN 500 MG PO CAPS: 500.0000 mg | ORAL_CAPSULE | Freq: Four times a day (QID) | ORAL | 0 refills | Status: DC

## 2022-04-14 MED ORDER — MUPIROCIN 2 % EX OINT: 1.0000 | TOPICAL_OINTMENT | Freq: Two times a day (BID) | CUTANEOUS | 0 refills | Status: DC

## 2022-04-14 MED ORDER — DOXYCYCLINE HYCLATE 100 MG PO TABS: 100.0000 mg | ORAL_TABLET | Freq: Two times a day (BID) | ORAL | 0 refills | Status: DC

## 2022-04-14 NOTE — Progress Notes (Signed)
Established Patient Office Visit  Subjective   Patient ID: Karl Atkins, male    DOB: Nov 27, 1965  Age: 57 y.o. MRN: [id number]  Chief Complaint  Patient presents with   Hypertension   Hypertension: Last office visit increased his medication dosage to olmesartan 40 mg-hydrochlorothiazide 12.5 mg once daily.  Also on metoprolol 25 mg daily.  Tolerating dosage increase well.  Was unable to get labs drawn.   Diabetes: Due for A1c check today.  Currently on glipizide 5 mg daily, metformin 1000 g twice a day, on ARB.  Abrasion: Has a cut on the lateral aspect of right lower extremity.  Had 1 on the medial aspect of the left lower leg that improved.  Reportedly hit right lower leg on something at work which resulted in cut, has had delayed healing.  Abdominal Pain: continues to bother him. He had new insurance would like to undergo MRI. Had CT scan previously which did not show any abnormalities to explain his pain. No knowledge of metal implanted in body. Has been ongoing for about 2 years. Started after having his gallbladder removed and since then has been having right-sided abdominal/flank/chest wall pain.  The area is tender to touch.  He has undergone evaluation previously and tells me he has tried gabapentin (originally for restless leg syndrome but this did not seem to help with his pain), tizanidine, and meloxicam without improvement in symptoms.  Has seen GI previously and was prescribed lidocaine patches which he reports also did not work.     ROS: see HPI    Objective:     BP (!) 156/82   Pulse 100   Temp 98.3 F (36.8 C) (Temporal)   Ht '6\' 1"'$  (1.854 m)   Wt 234 lb 4 oz (106.3 kg)   SpO2 99%   BMI 30.91 kg/m  BP Readings from Last 3 Encounters:  04/14/22 (!) 156/82  02/18/22 (!) 162/74  02/03/22 (!) 146/82   Wt Readings from Last 3 Encounters:  04/14/22 234 lb 4 oz (106.3 kg)  02/18/22 232 lb 4 oz (105.3 kg)  02/03/22 233 lb 6 oz (105.9 kg)      Physical  Exam Vitals reviewed.  Constitutional:      Appearance: Normal appearance.  HENT:     Head: Normocephalic and atraumatic.  Cardiovascular:     Rate and Rhythm: Normal rate and regular rhythm.  Pulmonary:     Effort: Pulmonary effort is normal.     Breath sounds: Normal breath sounds.  Musculoskeletal:     Cervical back: Neck supple.  Skin:    General: Skin is warm and dry.          Comments: Round ulceration, with purulent discharge, slight swelling of surrounding tissue. No heat to touch.  Neurological:     Mental Status: He is alert and oriented to person, place, and time.  Psychiatric:        Mood and Affect: Mood normal.        Behavior: Behavior normal.        Thought Content: Thought content normal.        Judgment: Judgment normal.      No results found for any visits on 04/14/22.    The 10-year ASCVD risk score (Arnett DK, et al., 2019) is: 19.7%    Assessment & Plan:   Problem List Items Addressed This Visit       Cardiovascular and Mediastinum   Hypertension - Primary    Chronic remains  above goal. Recommended increasing dose of metoprolol patient would prefer to hold off on this. Patient to follow-up in 6 weeks for close monitoring. BMP ordered as well today, Further recommendations may be made based on these results.        Relevant Orders   Comprehensive metabolic panel     Endocrine   Type 2 diabetes mellitus with hyperglycemia (HCC)    Chronic, check CMP and A1C today. Further recommendations may be made based on these results. Continue on glipizide '5mg'$ /day, metformin '1000mg'$  BID. Continue on ARB      Relevant Orders   Hemoglobin A1c   Comprehensive metabolic panel     Other   Abdominal pain    Chronic, etiology unknown. Order MRI of abdomen for further evaluation.       Relevant Orders   MR Abdomen W Wo Contrast   Lipase   Ambulatory referral to Gastroenterology   Comprehensive metabolic panel   Infected wound    Has what appears to  be an infected ulcer. Treat with doxycycline, cephalexin, and topical mupirocin. Notify office if not improving.       Relevant Medications   mupirocin ointment (BACTROBAN) 2 %   doxycycline (VIBRA-TABS) 100 MG tablet   cephALEXin (KEFLEX) 500 MG capsule    Return in about 6 weeks (around 05/26/2022) for 6 weeks with Judson Roch.    Ailene Ards, NP

## 2022-04-14 NOTE — Assessment & Plan Note (Signed)
Chronic, etiology unknown. Order MRI of abdomen for further evaluation.

## 2022-04-14 NOTE — Assessment & Plan Note (Signed)
Chronic remains above goal. Recommended increasing dose of metoprolol patient would prefer to hold off on this. Patient to follow-up in 6 weeks for close monitoring. BMP ordered as well today, Further recommendations may be made based on these results.

## 2022-04-14 NOTE — Assessment & Plan Note (Signed)
Has what appears to be an infected ulcer. Treat with doxycycline, cephalexin, and topical mupirocin. Notify office if not improving.

## 2022-04-14 NOTE — Assessment & Plan Note (Signed)
Chronic, check CMP and A1C today. Further recommendations may be made based on these results. Continue on glipizide '5mg'$ /day, metformin '1000mg'$  BID. Continue on ARB

## 2022-04-15 ENCOUNTER — Encounter: Payer: Self-pay | Admitting: Gastroenterology

## 2022-04-15 NOTE — Addendum Note (Signed)
Addended by: Ailene Ards on: 04/15/2022 02:41 PM   Modules accepted: Orders

## 2022-04-18 DIAGNOSIS — E1122 Type 2 diabetes mellitus with diabetic chronic kidney disease: Secondary | ICD-10-CM | POA: Diagnosis not present

## 2022-04-18 DIAGNOSIS — I129 Hypertensive chronic kidney disease with stage 1 through stage 4 chronic kidney disease, or unspecified chronic kidney disease: Secondary | ICD-10-CM | POA: Diagnosis not present

## 2022-04-18 DIAGNOSIS — N1831 Chronic kidney disease, stage 3a: Secondary | ICD-10-CM | POA: Diagnosis not present

## 2022-04-19 LAB — LAB REPORT - SCANNED
A1c: 8.8
Creatinine, POC: 135.2 mg/dL
EGFR: 53

## 2022-05-02 ENCOUNTER — Other Ambulatory Visit: Payer: Self-pay | Admitting: Nurse Practitioner

## 2022-05-02 DIAGNOSIS — E119 Type 2 diabetes mellitus without complications: Secondary | ICD-10-CM

## 2022-05-03 ENCOUNTER — Encounter: Payer: Self-pay | Admitting: Gastroenterology

## 2022-05-03 ENCOUNTER — Ambulatory Visit: Payer: 59 | Admitting: Gastroenterology

## 2022-05-03 VITALS — BP 170/82 | HR 76 | Temp 98.1°F | Ht 73.0 in | Wt 230.6 lb

## 2022-05-03 DIAGNOSIS — R1011 Right upper quadrant pain: Secondary | ICD-10-CM

## 2022-05-03 DIAGNOSIS — Z86010 Personal history of colonic polyps: Secondary | ICD-10-CM

## 2022-05-03 NOTE — Progress Notes (Signed)
GI Office Note    Referring Provider: Ailene Ards, NP Primary Care Physician:  Ailene Ards, NP  Primary Gastroenterologist: Garfield Cornea, MD   Chief Complaint   Chief Complaint  Patient presents with   Abdominal Pain    Right side upper abdominal pain that goes around to his back.     History of Present Illness   Karl Atkins is a 57 y.o. male presenting today for follow up.  Last seen in August 2022.  At that time with persisting lateral right upper quadrant abdominal pain postcholecystectomy (January 2022).  Suspected myofascial etiology lingering from surgery at that time.  Given trial of Lidoderm patches which did not help.  Per PCP recent note, patient also previously tried gabapentin (used for RLS), tizanidine, meloxicam not helpful. Patient states he has had persistent pain since his cholecystectomy. Pain constant but worse some days. No postprandial component. Can eat anything he likes. Feels like the right side of his upper abdomen/chest is in a girdle. Feels like it restricts breathing. Has always been a side sleeper, usually slept on his right side but unable to do that due to pain. Tries to sleep on left side but sleeps only about 3 hours per night. Feels run down by this nagging pain.   Pain not worse with palpation. Not affected by BMs. BM normal, no melena, brbpr. Takes lavender bath every night which seems to soothe the pain. His massage therapist works on the fascia along the right chest wall and this seems to provide temporary relief.   Schedule for upcoming MRI Abd ordered by PCP. Chest xray one year ago unremarkable.    Labs from March 2024: A1c 8.8, hemoglobin 15.4, iron 138, iron saturations 43%, TIBC 320, ferritin 201, BUN 25, creatinine 1.52, albumin 4.5, total bilirubin 0.6, alkaline phosphatase 33, AST 21, ALT 17, lipase 41  CT abdomen pelvis without contrast June 2023 for right-sided abdominal pain ordered by PCP: IMPRESSION: 1. No acute  abnormality identified in the abdomen and pelvis. 2. 2 mm nonobstructing stone in the lower pole left kidney. 3. Status post cholecystectomy.  MRI thoracic spine 02/2020: Mild degenerative changes without significant stenosis  Colonoscopy March 2018: -6 mm polyp removed from the proximal descending colon -6 mm polyp removed from the mid ascending colon -Tubular adenomas   Medications   Current Outpatient Medications  Medication Sig Dispense Refill   cetirizine (ZYRTEC) 5 MG tablet Take 5 mg by mouth daily.     Cholecalciferol (VITAMIN D3) 125 MCG (5000 UT) CAPS Take 2 capsules by mouth daily.     fenofibrate (TRICOR) 145 MG tablet Take 1 tablet (145 mg total) by mouth daily. 90 tablet 2   glipiZIDE (GLUCOTROL) 5 MG tablet TAKE ONE TABLET (5MG  TOTAL) BY MOUTH DAILY BEFORE BREAKFAST 30 tablet 2   ibuprofen (ADVIL) 800 MG tablet Take 1 tablet (800 mg total) by mouth every 8 (eight) hours as needed. 30 tablet 0   JARDIANCE 10 MG TABS tablet Take 10 mg by mouth daily.     metFORMIN (GLUCOPHAGE) 1000 MG tablet Take 1 tablet (1,000 mg total) by mouth 2 (two) times daily with a meal. 180 tablet 1   metoprolol succinate (TOPROL-XL) 25 MG 24 hr tablet Take 1/2 tablet by mouth daily for 1 week if tolerated, then increase to 1 tablet by mouth daily. 90 tablet 1   Multiple Vitamin (MULTIVITAMIN WITH MINERALS) TABS tablet Take 1 tablet by mouth daily before lunch.  mupirocin ointment (BACTROBAN) 2 % Apply 1 Application topically 2 (two) times daily. 22 g 0   olmesartan-hydrochlorothiazide (BENICAR HCT) 40-12.5 MG tablet Take 1 tablet by mouth daily. 90 tablet 3   Quercetin 500 MG CAPS Take by mouth.     vitamin C (ASCORBIC ACID) 500 MG tablet Take 500 mg by mouth daily before lunch.     Zinc Sulfate (ZINC 15 PO) Take by mouth daily.     albuterol (VENTOLIN HFA) 108 (90 Base) MCG/ACT inhaler Inhale 2 puffs into the lungs every 6 (six) hours as needed for wheezing or shortness of breath. (Patient not  taking: Reported on 05/03/2022) 8 g 2   No current facility-administered medications for this visit.    Allergies   Allergies as of 05/03/2022 - Review Complete 05/03/2022  Allergen Reaction Noted   Bee pollen Other (See Comments) 06/03/2010   Pollen extract-tree extract Other (See Comments) 06/03/2010     Past Medical History   Past Medical History:  Diagnosis Date   Diabetic neuropathy ([place]) 05/16/2016   Essential hypertension    Headache    hx migraines mid 1990's   Hyperlipidemia    Obesity    Type 2 diabetes mellitus ([address]) 2011    Past Surgical History   Past Surgical History:  Procedure Laterality Date   BACK SURGERY  2014   lower back   CHOLECYSTECTOMY N/A 03/13/2020   Procedure: LAPAROSCOPIC CHOLECYSTECTOMY;  Surgeon: Aviva Signs, MD;  Location: [facility];  Service: General;  Laterality: N/A;   COLONOSCOPY WITH PROPOFOL N/A 04/19/2016   Surgeon: Garlan Fair, MD; Two 6 mm tubular adenomas.   MIDDLE EAR SURGERY     x2    Past Family History   Family History  Problem Relation Age of Onset   Diabetes Father    Heart failure Father    Diabetes Mother    Heart failure Mother    Hypertension Mother    Diabetes Sister    Colon cancer Neg Hx     Past Social History   Social History   Socioeconomic History   Marital status: Married    Spouse name: Not on file   Number of children: Not on file   Years of education: Not on file   Highest education level: Not on file  Occupational History   Occupation: Nurse, learning disability  Tobacco Use   Smoking status: Never   Smokeless tobacco: Never  Vaping Use   Vaping Use: Never used  Substance and Sexual Activity   Alcohol use: Yes    Comment: occ   Drug use: No   Sexual activity: Not on file  Other Topics Concern   Not on file  Social History Narrative   Married for 20 years.Lives with wife.Previously Nurse, learning disability.Owns car wash.   Social Determinants of Health   Financial Resource Strain: Not on  file  Food Insecurity: Not on file  Transportation Needs: Not on file  Physical Activity: Not on file  Stress: Not on file  Social Connections: Not on file  Intimate Partner Violence: Not on file    Review of Systems   General: Negative for anorexia, weight loss, fever, chills, fatigue, weakness. ENT: Negative for hoarseness, difficulty swallowing , nasal congestion. CV: Negative for chest pain, angina, palpitations, dyspnea on exertion, peripheral edema.  Respiratory: Negative for dyspnea at rest, dyspnea on exertion, cough, sputum, wheezing.  GI: See history of present illness. GU:  Negative for dysuria, hematuria, urinary incontinence, urinary frequency, nocturnal urination.  Endo: Negative for unusual weight change.     Physical Exam   BP (!) 170/82 (BP Location: Right Arm, Patient Position: Sitting)   Pulse 76   Temp 98.1 F (36.7 C) (Temporal)   Ht 6\' 1"  (1.854 m)   Wt 230 lb 9.6 oz (104.6 kg)   SpO2 97%   BMI 30.42 kg/m    General: Well-nourished, well-developed in no acute distress.  Eyes: No icterus. Mouth: Oropharyngeal mucosa moist and pink  Lungs: Clear to auscultation bilaterally.  Heart: Regular rate and rhythm, no murmurs rubs or gallops.  Abdomen: Bowel sounds are normal, nontender, nondistended, no hepatosplenomegaly or masses,  no abdominal bruits or hernia , no rebound or guarding.  Rectal: Not performed Extremities: No lower extremity edema. No clubbing or deformities. Neuro: Alert and oriented x 4   Skin: Warm and dry, no jaundice.   Psych: Alert and cooperative, normal mood and affect.  Labs    See hpi  Imaging Studies   No results found.  Assessment   Right upper quadrant/right chest pain: Patient with persistent symptoms status post cholecystectomy in January 2022.  It is not clear whether the symptoms are the same symptoms that he had prior to cholecystectomy.  He anticipated recovering from gallbladder surgery and having no further  right-sided abdominal pain.  There is no postprandial component.  Sensation of girdle-like/tightness right upper quadrant/right chest extending laterally into the posterior shoulder blade region.  Some improvement with massage by his massage therapist.  Appears to be positional component.  MRI thoracic region January 2022 without any findings to suggest radicular pain.  Unfortunately he has not responded to gabapentin, Lidoderm patches, NSAIDs, tizanidine.  He has pending MRI abdomen with and without contrast.  Based on findings, may consider CT chest to complete workup for overt structural abnormality.  Symptoms may end up being neuropathic in nature.  History of adenomatous colon polyps: Due for colonoscopy this year.  Move towards colonoscopy after current workup completed.  The patient was found to have elevated blood pressure when vital signs were checked in the office. The blood pressure was rechecked by the nursing staff and it was found be persistently elevated >140/90 mmHg. I personally advised to the patient to follow up closely with his PCP for hypertension control.   PLAN   Follow-up MRI findings.  Further recommendations to follow.  Laureen Ochs. Bobby Rumpf, [place], [facility]

## 2022-05-03 NOTE — Patient Instructions (Signed)
Recheck your blood pressure later today. Call PCP if persistently over 140/90. We will follow up next week after MRI results available.

## 2022-05-06 IMAGING — NM NM HEPATO W/GB/PHARM/[PERSON_NAME]
2 series · 12 of 12 positions shown · non-contrast
Comparison: None.

CLINICAL DATA: Right upper quadrant pain for 1 month.

EXAM:
NUCLEAR MEDICINE HEPATOBILIARY IMAGING WITH GALLBLADDER EF
TECHNIQUE: Sequential images of the abdomen were obtained [DATE] minutes
following intravenous administration of radiopharmaceutical. After
oral ingestion of Ensure, gallbladder ejection fraction was
determined. At 60 min, normal ejection fraction is greater than 33%.
RADIOPHARMACEUTICALS:  4.9 mCi Kc-EEm  Choletec IV

[Series 1: biliary · 3.25mm/px · 6 of 60 frames shown]
[frame 6/60]
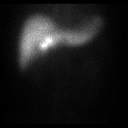
[frame 16/60]
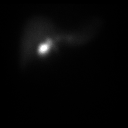
[frame 26/60]
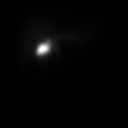
[frame 36/60]
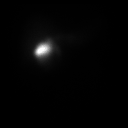
[frame 46/60]
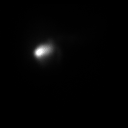
[frame 56/60]
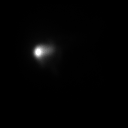

[Series 2: gbef · 3.25mm/px · 6 of 60 frames shown]
[frame 6/60]
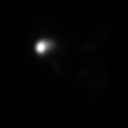
[frame 16/60]
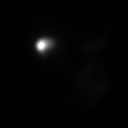
[frame 26/60]
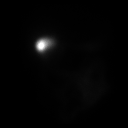
[frame 36/60]
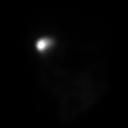
[frame 46/60]
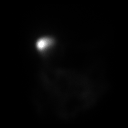
[frame 56/60]
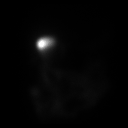

[12 of 12 positions shown; findings below may reference images not displayed]

FINDINGS: Prompt uptake and biliary excretion of activity by the liver is
seen. Gallbladder activity is visualized, consistent with patency of
cystic duct. Biliary activity passes into small bowel, consistent
with patent common bile duct.

Calculated gallbladder ejection fraction is 33%. (Normal gallbladder
ejection fraction with Ensure is greater than 33%.)
IMPRESSION: Normal hepatobiliary scan, demonstrating patency of cystic and
common bile ducts.

Borderline low gallbladder ejection fraction of 33%.

## 2022-05-08 ENCOUNTER — Ambulatory Visit
Admission: RE | Admit: 2022-05-08 | Discharge: 2022-05-08 | Disposition: A | Discharge status: Home / Self Care | Payer: 59 | Source: Ambulatory Visit | Attending: Nurse Practitioner | Admitting: Nurse Practitioner

## 2022-05-08 DIAGNOSIS — K769 Liver disease, unspecified: Secondary | ICD-10-CM | POA: Diagnosis not present

## 2022-05-08 DIAGNOSIS — R1084 Generalized abdominal pain: Secondary | ICD-10-CM

## 2022-05-08 MED ORDER — GADOPICLENOL 0.5 MMOL/ML IV SOLN
9.0000 mL | Freq: Once | INTRAVENOUS | Status: AC | PRN
  Administered 2022-05-08: 9 mL via INTRAVENOUS

## 2022-05-26 ENCOUNTER — Ambulatory Visit: Payer: 59 | Admitting: Nurse Practitioner

## 2022-05-26 VITALS — BP 132/70 | HR 88 | Temp 97.9°F | Ht 73.0 in | Wt 226.0 lb

## 2022-05-26 DIAGNOSIS — I10 Essential (primary) hypertension: Secondary | ICD-10-CM | POA: Diagnosis not present

## 2022-05-26 DIAGNOSIS — R1084 Generalized abdominal pain: Secondary | ICD-10-CM

## 2022-05-26 DIAGNOSIS — E1165 Type 2 diabetes mellitus with hyperglycemia: Secondary | ICD-10-CM | POA: Diagnosis not present

## 2022-05-26 LAB — POCT GLYCOSYLATED HEMOGLOBIN (HGB A1C): Hemoglobin A1C: 7.9 % — AB (ref 4.0–5.6)

## 2022-05-26 MED ORDER — METOPROLOL SUCCINATE ER 50 MG PO TB24: 50.0000 mg | ORAL_TABLET | Freq: Every day | ORAL | 1 refills | Status: AC

## 2022-05-26 MED ORDER — GABAPENTIN 100 MG PO CAPS
100.0000 mg | ORAL_CAPSULE | Freq: Every day | ORAL | 0 refills | Status: DC
Start: 2022-05-26 — End: 2022-08-05

## 2022-05-26 MED ORDER — GLIPIZIDE 10 MG PO TABS
10.0000 mg | ORAL_TABLET | Freq: Every day | ORAL | 1 refills | Status: DC
Start: 2022-05-26 — End: 2022-11-30

## 2022-05-26 NOTE — Assessment & Plan Note (Signed)
Chronic, blood pressure at goal on current regimen.  Continue metoprolol 50 mg daily, olmesartan-hydrochlorothiazide 40-12.5 mg daily.

## 2022-05-26 NOTE — Assessment & Plan Note (Addendum)
Chronic, point-of-care A1c today 7.9 which is an improvement.  Continue on Jardiance 10 mg daily, metformin 1000 mg twice a day, increase glipizide to 10 mg daily.  Discussed signs and symptoms of hypoglycemia and what to do if these were to occur.  Patient reports understanding.  Continue ARB.

## 2022-05-26 NOTE — Assessment & Plan Note (Signed)
Etiology still unclear, after shared decision making we will trial gabapentin 100 mg by mouth at bedtime as needed for pain.

## 2022-05-26 NOTE — Patient Instructions (Signed)
A1C today 7.9

## 2022-05-26 NOTE — Progress Notes (Signed)
Established Patient Office Visit  Subjective   Patient ID: Karl Atkins, male    DOB: Jun 05, 1965  Age: 57 y.o. MRN: [id number]  Chief Complaint  Patient presents with   Medical Management of Chronic Issues    Follow up, no new concern     T2DM: On glipizide 5 mg daily, Jardiance 10 mg daily, metformin 1000 mg twice a day, tolerating medication well.  On ARB.  Not on statin.  Has made significant dietary changes since last office visit.  Has had weight loss as well.  Has chronic kidney disease also associated with type 2 diabetes.  Follows with nephrology.  HTN: On metoprolol 50 mg daily, olmesartan-hydrochlorothiazide 40-12.5 mg daily.  Also on Jardiance 10 mg daily for type 2 diabetes.  Tolerating medications well.  Right upper quadrant pain: Underwent MRI of the abdomen which did not identify any abnormalities other than evidence that gallbladder has been removed.  Also underwent additional evaluation with gastroenterology.  Pain does improve with massage therapy.  Has tried and failed Lidoderm patches, NSAIDs, tizanidine.  Has also tried gabapentin in the past he is not sure if this helped his pain or not.  HPI 03/2022:continues to bother him. He had new insurance would like to undergo MRI. Had CT scan previously which did not show any abnormalities to explain his pain. No knowledge of metal implanted in body. Has been ongoing for about 2 years. Started after having his gallbladder removed and since then has been having right-sided abdominal/flank/chest wall pain.  The area is tender to touch.  He has undergone evaluation previously and tells me he has tried gabapentin (originally for restless leg syndrome but this did not seem to help with his pain), tizanidine, and meloxicam without improvement in symptoms.  Has seen GI previously and was prescribed lidocaine patches which he reports also did not work.     Review of Systems  HENT:  Positive for congestion.   Respiratory:  Negative for  shortness of breath.   Cardiovascular:  Negative for chest pain.  Gastrointestinal:  Positive for abdominal pain.      Objective:     BP 132/70   Pulse 88   Temp 97.9 F (36.6 C) (Temporal)   Ht 6\' 1"  (1.854 m)   Wt 226 lb (102.5 kg)   SpO2 97%   BMI 29.82 kg/m  BP Readings from Last 3 Encounters:  05/26/22 132/70  05/03/22 (!) 170/82  04/14/22 (!) 156/82   Wt Readings from Last 3 Encounters:  05/26/22 226 lb (102.5 kg)  05/03/22 230 lb 9.6 oz (104.6 kg)  04/14/22 234 lb 4 oz (106.3 kg)      Physical Exam Vitals reviewed.  Constitutional:      Appearance: Normal appearance.  HENT:     Head: Normocephalic and atraumatic.  Cardiovascular:     Rate and Rhythm: Normal rate and regular rhythm.  Pulmonary:     Effort: Pulmonary effort is normal.     Breath sounds: Normal breath sounds.  Musculoskeletal:     Cervical back: Neck supple.  Skin:    General: Skin is warm and dry.  Neurological:     Mental Status: He is alert and oriented to person, place, and time.  Psychiatric:        Mood and Affect: Mood normal.        Behavior: Behavior normal.        Thought Content: Thought content normal.        Judgment: Judgment  normal.      Results for orders placed or performed in visit on 05/26/22  POCT glycosylated hemoglobin (Hb A1C)  Result Value Ref Range   Hemoglobin A1C 7.9 (A) 4.0 - 5.6 %   HbA1c POC (<> result, manual entry)     HbA1c, POC (prediabetic range)     HbA1c, POC (controlled diabetic range)        The 10-year ASCVD risk score (Arnett DK, et al., 2019) is: 15%    Assessment & Plan:   Problem List Items Addressed This Visit       Cardiovascular and Mediastinum   Hypertension    Chronic, blood pressure at goal on current regimen.  Continue metoprolol 50 mg daily, olmesartan-hydrochlorothiazide 40-12.5 mg daily.      Relevant Medications   metoprolol succinate (TOPROL-XL) 50 MG 24 hr tablet   Other Relevant Orders   Lipase      Endocrine   Type 2 diabetes mellitus with hyperglycemia    Chronic, point-of-care A1c today 7.9 which is an improvement.  Continue on Jardiance 10 mg daily, metformin 1000 mg twice a day, increase glipizide to 10 mg daily.  Discussed signs and symptoms of hypoglycemia and what to do if these were to occur.  Patient reports understanding.  Continue ARB.      Relevant Medications   glipiZIDE (GLUCOTROL) 10 MG tablet   Other Relevant Orders   POCT glycosylated hemoglobin (Hb A1C) (Completed)     Other   Abdominal pain - Primary    Etiology still unclear, after shared decision making we will trial gabapentin 100 mg by mouth at bedtime as needed for pain.      Relevant Medications   gabapentin (NEURONTIN) 100 MG capsule   Other Relevant Orders   Lipase   Comprehensive metabolic panel    Return in about 3 months (around 08/25/2022) for F/U with Macil Crady.    Elenore Paddy, NP

## 2022-07-12 ENCOUNTER — Other Ambulatory Visit: Payer: Self-pay | Admitting: Nurse Practitioner

## 2022-07-12 DIAGNOSIS — E782 Mixed hyperlipidemia: Secondary | ICD-10-CM

## 2022-07-17 ENCOUNTER — Telehealth: Payer: Self-pay | Admitting: Gastroenterology

## 2022-07-17 NOTE — Telephone Encounter (Addendum)
Reviewed mri done to evaluate chronic ruq pain. No significant findings. Would offer patient return ov to discuss if any additonal work up needed such as chest ct+/-EGD??? He is due for surveillance colonoscopy for h/o colon polyps as well.  If Dr. Jena Gauss has appt available, I would recommend him. Otherwise APP

## 2022-08-01 DIAGNOSIS — N1831 Chronic kidney disease, stage 3a: Secondary | ICD-10-CM | POA: Diagnosis not present

## 2022-08-01 DIAGNOSIS — I129 Hypertensive chronic kidney disease with stage 1 through stage 4 chronic kidney disease, or unspecified chronic kidney disease: Secondary | ICD-10-CM | POA: Diagnosis not present

## 2022-08-01 DIAGNOSIS — E1122 Type 2 diabetes mellitus with diabetic chronic kidney disease: Secondary | ICD-10-CM | POA: Diagnosis not present

## 2022-08-02 LAB — LAB REPORT - SCANNED
Creatinine, POC: 86.5 mg/dL
EGFR: 41

## 2022-08-05 ENCOUNTER — Encounter: Payer: Self-pay | Admitting: Gastroenterology

## 2022-08-05 ENCOUNTER — Ambulatory Visit (INDEPENDENT_AMBULATORY_CARE_PROVIDER_SITE_OTHER): Payer: 59 | Admitting: Gastroenterology

## 2022-08-05 VITALS — BP 202/108 | HR 69 | Temp 97.9°F | Ht 73.0 in | Wt 234.0 lb

## 2022-08-05 DIAGNOSIS — R1011 Right upper quadrant pain: Secondary | ICD-10-CM | POA: Diagnosis not present

## 2022-08-05 DIAGNOSIS — R0789 Other chest pain: Secondary | ICD-10-CM | POA: Diagnosis not present

## 2022-08-05 DIAGNOSIS — Z86010 Personal history of colonic polyps: Secondary | ICD-10-CM | POA: Diagnosis not present

## 2022-08-05 NOTE — Patient Instructions (Signed)
Please monitor your blood pressure after taking your medication. If it remains elevated, top number more than 170, please call your PCP today.  We will be in touch to schedule your colonoscopy once the August schedule is available.

## 2022-08-05 NOTE — Progress Notes (Signed)
GI Office Note    Referring Provider: Elenore Paddy, NP Primary Care Physician:  Elenore Paddy, NP  Primary Gastroenterologist: Roetta Sessions, MD   Chief Complaint   Chief Complaint  Patient presents with   Abdominal Pain    Right side abdominal pain    History of Present Illness   Karl Atkins is a 57 y.o. male presenting today.  Last seen in the office in March 2024.  History of chronic right upper quadrant pain persisting after cystectomy in January 2022.  Initially suspected myofascial etiology lingering from surgery.  Trial of Lidoderm patches did not help.  Per PCP recent note, patient also previously tried gabapentin (used for RLS), tizanidine, meloxicam not helpful. Patient states he has had persistent pain since his cholecystectomy. His massage therapist works on the fascia along the right chest wall and this seems to provide temporary relief.    MRI thoracic region January 2022 without any findings to suggest radicular pain.   Labs from March 2024: A1c 8.8, hemoglobin 15.4, iron 138, iron saturations 43%, TIBC 320, ferritin 201, BUN 25, creatinine 1.52, albumin 4.5, total bilirubin 0.6, alkaline phosphatase 33, AST 21, ALT 17, lipase 41  MRI Abd without and with contrast 04/2022: IMPRESSION: Negative MRI abdomen.  No suspicious/enhancing hepatic lesions.   CT abdomen pelvis without contrast June 2023 for right-sided abdominal pain ordered by PCP: IMPRESSION: 1. No acute abnormality identified in the abdomen and pelvis. 2. 2 mm nonobstructing stone in the lower pole left kidney. 3. Status post cholecystectomy.   MRI thoracic spine 02/2020: Mild degenerative changes without significant stenosis   Colonoscopy March 2018: -6 mm polyp removed from the proximal descending colon -6 mm polyp removed from the mid ascending colon -Tubular adenomas  Today: continues to have daily nagging pain in the right upper abd/chest. Superficially the area feels numb. Deeper in the  area, he has more of nagging type pain. Some days worse then others. Constant pain wears at him. Not worse with food or related to bowels. Feels like a girdle on, restricts breathing.  BM regular. No melena, brbpr. No heartburn. Uses tylenol as needed. Feels like he is going to have to learn to live with the pain, which is frustrating to him. He has paid out a lot of money on tests and still has no answers and suffers with pain. He is not interested in further testing or pain management referral. His blood pressure is up today. He has not taken his BP medication but plans to take at lunch. No headache, dizziness, chest pain.   He is interested in pursuing colonoscopy at this time.   Medications   Current Outpatient Medications  Medication Sig Dispense Refill   cetirizine (ZYRTEC) 5 MG tablet Take 5 mg by mouth daily.     Cholecalciferol (VITAMIN D3) 125 MCG (5000 UT) CAPS Take 2 capsules by mouth daily.     doxycycline (VIBRAMYCIN) 100 MG capsule Take 100 mg by mouth 2 (two) times daily.     fenofibrate (TRICOR) 145 MG tablet TAKE ONE TABLET (145 MG TOTAL) BY MOUTH DAILY. 90 tablet 2   glipiZIDE (GLUCOTROL) 10 MG tablet Take 1 tablet (10 mg total) by mouth daily before breakfast. 90 tablet 1   JARDIANCE 10 MG TABS tablet Take 10 mg by mouth daily.     metFORMIN (GLUCOPHAGE) 1000 MG tablet Take 1 tablet (1,000 mg total) by mouth 2 (two) times daily with a meal. 180 tablet 1  metoprolol succinate (TOPROL-XL) 50 MG 24 hr tablet Take 1 tablet (50 mg total) by mouth daily. 90 tablet 1   Multiple Vitamin (MULTIVITAMIN WITH MINERALS) TABS tablet Take 1 tablet by mouth daily before lunch.     olmesartan-hydrochlorothiazide (BENICAR HCT) 40-12.5 MG tablet Take 1 tablet by mouth daily. 90 tablet 3   Quercetin 500 MG CAPS Take by mouth.     vitamin C (ASCORBIC ACID) 500 MG tablet Take 500 mg by mouth daily before lunch.     Zinc Sulfate (ZINC 15 PO) Take by mouth daily.     No current  facility-administered medications for this visit.    Allergies   Allergies as of 08/05/2022 - Review Complete 08/05/2022  Allergen Reaction Noted   Bee pollen Other (See Comments) 06/03/2010   Pollen extract-tree extract Other (See Comments) 06/03/2010     Past Medical History   Past Medical History:  Diagnosis Date   Diabetic neuropathy (HCC) 05/16/2016   Essential hypertension    Headache    hx migraines mid 1990's   Hyperlipidemia    Obesity    Type 2 diabetes mellitus (HCC) 2011    Past Surgical History   Past Surgical History:  Procedure Laterality Date   BACK SURGERY  2014   lower back   CHOLECYSTECTOMY N/A 03/13/2020   Procedure: LAPAROSCOPIC CHOLECYSTECTOMY;  Surgeon: Franky Macho, MD;  Location: [facility];  Service: General;  Laterality: N/A;   COLONOSCOPY WITH PROPOFOL N/A 04/19/2016   Surgeon: Charolett Bumpers, MD; Two 6 mm tubular adenomas.   MIDDLE EAR SURGERY     x2    Past Family History   Family History  Problem Relation Age of Onset   Diabetes Father    Heart failure Father    Diabetes Mother    Heart failure Mother    Hypertension Mother    Diabetes Sister    Colon cancer Neg Hx     Past Social History   Social History   Socioeconomic History   Marital status: Married    Spouse name: Not on file   Number of children: Not on file   Years of education: Not on file   Highest education level: Associate degree: occupational, Scientist, product/process development, or vocational program  Occupational History   Occupation: Marine scientist  Tobacco Use   Smoking status: Never   Smokeless tobacco: Never  Vaping Use   Vaping Use: Never used  Substance and Sexual Activity   Alcohol use: Yes    Comment: occ   Drug use: No   Sexual activity: Yes  Other Topics Concern   Not on file  Social History Narrative   Married for 20 years.Lives with wife.Previously Marine scientist.Owns car wash.   Social Determinants of Health   Financial Resource Strain: Low Risk   (05/25/2022)   Overall Financial Resource Strain (CARDIA)    Difficulty of Paying Living Expenses: Not hard at all  Food Insecurity: No Food Insecurity (05/25/2022)   Hunger Vital Sign    Worried About Running Out of Food in the Last Year: Never true    Ran Out of Food in the Last Year: Never true  Transportation Needs: No Transportation Needs (05/25/2022)   PRAPARE - Administrator, Civil Service (Medical): No    Lack of Transportation (Non-Medical): No  Physical Activity: Insufficiently Active (05/25/2022)   Exercise Vital Sign    Days of Exercise per Week: 1 day    Minutes of Exercise per Session: 60 min  Stress: No Stress Concern Present (05/25/2022)   [employer] of Occupational Health - Occupational Stress Questionnaire    Feeling of Stress : Not at all  Social Connections: Socially Integrated (05/25/2022)   Social Connection and Isolation Panel [NHANES]    Frequency of Communication with Friends and Family: More than three times a week    Frequency of Social Gatherings with Friends and Family: Once a week    Attends Religious Services: 1 to 4 times per year    Active Member of [employer] or Organizations: Yes    Attends Banker Meetings: 1 to 4 times per year    Marital Status: Married  Catering manager Violence: Not on file    Review of Systems   General: Negative for anorexia, weight loss, fever, chills, fatigue, weakness. ENT: Negative for hoarseness, difficulty swallowing , nasal congestion. CV: Negative for chest pain, angina, palpitations, dyspnea on exertion, peripheral edema.  Respiratory: Negative for dyspnea at rest, dyspnea on exertion, cough, sputum, wheezing.  GI: See history of present illness. GU:  Negative for dysuria, hematuria, urinary incontinence, urinary frequency, nocturnal urination.  Endo: Negative for unusual weight change.     Physical Exam   BP (!) 202/108 (BP Location: Right Arm, Patient Position: Sitting, Cuff Size:  Large)   Pulse 69   Temp 97.9 F (36.6 C) (Oral)   Ht 6\' 1"  (1.854 m)   Wt 234 lb (106.1 kg)   SpO2 97%   BMI 30.87 kg/m    General: Well-nourished, well-developed in no acute distress.  Eyes: No icterus. Mouth: Oropharyngeal mucosa moist and pink   Lungs: Clear to auscultation bilaterally.  Heart: Regular rate and rhythm, no murmurs rubs or gallops.  Abdomen: Bowel sounds are normal,  nondistended, no hepatosplenomegaly or masses,  no abdominal bruits or hernia , no rebound or guarding. Pain in ruq not reproducible. Location of pain noted to be ruq/right anterior lower rib/lateral ribs. Rectal: not performed Extremities: No lower extremity edema. No clubbing or deformities. Neuro: Alert and oriented x 4   Skin: Warm and dry, no jaundice.   Psych: Alert and cooperative, normal mood and affect.  Labs    Labs from March 2024: A1c 8.8, hemoglobin 15.4, iron 138, iron saturations 43%, TIBC 320, ferritin 201, BUN 25, creatinine 1.52, albumin 4.5, total bilirubin 0.6, alkaline phosphatase 33, AST 21, ALT 17, lipase 41   Imaging Studies   No results found.  Assessment   RUQ pain/right chest pain: unclear etiology but suspect musculoskeletal or nerve related.  Symptoms since cholecystectomy in January 2022.  No postprandial component in relation to bowel function.  Superficially the area feels numb but he has a sensation of tightness griddle like compression in the right upper quadrant/right chest extending laterally into the posterior shoulder blade region.  Sometimes improvement with massage by massage therapist but never pain-free.  Appears to have significant positional component.  MRI thoracic region in January 2022 without findings to suggest radicular pain.  Unfortunately has not responded to gabapentin, Lidoderm patches, NSAIDs, tabs and edema.  MRI abdomen recently unremarkable.  We discussed potential CT chest and/or EGD to complete workup but at this time he is not interested.  He  is also not interested in pain management referral.  History of adenomatous colon polyps: Due for colonoscopy at any time.  The patient was found to have elevated blood pressure when vital signs were checked in the office. The blood pressure was rechecked by the nursing staff and it was  found be persistently elevated >140/90 mmHg. I personally advised to the patient to follow up closely with his PCP for hypertension control.  PLAN   Patient to check BP after taking his medications. He declined ED or urgent care visit today. If systolic pressure remains above 170, he is to call his PCP, or go to urgent care/ED. Colonoscopy in near future. ASA 3.  I have discussed the risks, alternatives, benefits with regards to but not limited to the risk of reaction to medication, bleeding, infection, perforation and the patient is agreeable to proceed. Written consent to be obtained.    Leanna Battles. Melvyn Neth, MHS, PA-C [facility]

## 2022-08-31 ENCOUNTER — Ambulatory Visit (INDEPENDENT_AMBULATORY_CARE_PROVIDER_SITE_OTHER): Payer: 59

## 2022-08-31 ENCOUNTER — Ambulatory Visit
Admission: RE | Admit: 2022-08-31 | Discharge: 2022-08-31 | Disposition: A | Discharge status: Home / Self Care | Payer: 59 | Source: Ambulatory Visit | Attending: Family Medicine | Admitting: Family Medicine

## 2022-08-31 ENCOUNTER — Other Ambulatory Visit: Payer: 59

## 2022-08-31 ENCOUNTER — Telehealth: Payer: Self-pay | Admitting: *Deleted

## 2022-08-31 VITALS — BP 181/97 | HR 79 | Temp 98.2°F | Resp 13

## 2022-08-31 DIAGNOSIS — M7732 Calcaneal spur, left foot: Secondary | ICD-10-CM | POA: Diagnosis not present

## 2022-08-31 DIAGNOSIS — M25472 Effusion, left ankle: Secondary | ICD-10-CM | POA: Diagnosis not present

## 2022-08-31 DIAGNOSIS — M19072 Primary osteoarthritis, left ankle and foot: Secondary | ICD-10-CM | POA: Diagnosis not present

## 2022-08-31 NOTE — Telephone Encounter (Signed)
LMOVM to call back to schedule TCS with Dr. Jena Gauss, ASA 3, hold jardiance x 3 days prior, day prior, take glipizide AM only, day of TCS no DM meds

## 2022-08-31 NOTE — ED Triage Notes (Signed)
Pt c/o left ankle pain, has been swelling for 2 weeks, swelling goes down in the night when the ankle is elevated swelling is more during the day when pt is up and moving around.

## 2022-08-31 NOTE — Discharge Instructions (Signed)
Ice, elevate, compression wraps, resting, anti-inflammatories.  Follow-up with orthopedics if not resolving or worsening

## 2022-09-01 NOTE — ED Provider Notes (Signed)
[facility]    CSN: [id number] Arrival date & time: 08/31/22  1800      History   Chief Complaint Chief Complaint  Patient presents with   Ankle Pain    After overdoing it last weekend, left ankle has been swelling off and on - Entered by patient    HPI Karl Atkins is a 57 y.o. male.   Patient presenting today with 2-week history of intermittent left ankle pain and swelling after overdoing it at a Fourth of July party.  He states it swells more more as he is on his feet during the day and then resolves overnight while leg is elevated.  Denies numbness, tingling, weakness, discoloration, known direct injury.  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   Kidney dysfunction 06/18/2021   Chest wall pain 05/28/2021   Tinea cruris 05/06/2021   Rash 05/06/2021   Biliary dyskinesia 04/29/2020   Chronic cholecystitis    Abdominal pain 02/27/2020   History of colonic polyps 02/27/2020   Diabetic neuropathy (HCC) 05/16/2016   Hypertension    Hyperlipidemia    Type 2 diabetes mellitus with hyperglycemia (HCC) 04/09/2010   OBESITY 04/09/2010    Past Surgical History:  Procedure Laterality Date   BACK SURGERY  2014   lower back   CHOLECYSTECTOMY N/A 03/13/2020   Procedure: LAPAROSCOPIC CHOLECYSTECTOMY;  Surgeon: Franky Macho, MD;  Location: [facility];  Service: General;  Laterality: N/A;   COLONOSCOPY WITH PROPOFOL N/A 04/19/2016   Surgeon:  Charolett Bumpers, MD; Two 6 mm tubular adenomas.   MIDDLE EAR SURGERY     x2       Home Medications    Prior to Admission medications   Medication Sig Start Date End Date Taking? Authorizing Provider  cetirizine (ZYRTEC) 5 MG tablet Take 5 mg by mouth daily.    [provider]  Cholecalciferol (VITAMIN D3) 125 MCG (5000 UT) CAPS Take 2 capsules by mouth daily.    [provider]  doxycycline (VIBRAMYCIN) 100 MG capsule Take 100 mg by mouth 2 (two) times daily. 07/22/22   [provider]  fenofibrate (TRICOR) 145 MG tablet TAKE ONE TABLET (145 MG TOTAL) BY MOUTH DAILY. 07/12/22   Elenore Paddy, NP  glipiZIDE (GLUCOTROL) 10 MG tablet Take 1 tablet (10 mg total) by mouth daily before breakfast. 05/26/22   Elenore Paddy, NP  JARDIANCE 10 MG TABS tablet Take 10 mg by mouth daily. 04/23/22   [provider]  metFORMIN (GLUCOPHAGE) 1000 MG tablet Take 1 tablet (1,000 mg total) by mouth 2 (two) times daily with a meal. 10/20/21   Elenore Paddy, NP  metoprolol succinate (TOPROL-XL) 50 MG 24 hr tablet Take 1 tablet (50 mg total) by mouth daily. 05/26/22   Elenore Paddy, NP  Multiple Vitamin (MULTIVITAMIN WITH MINERALS) TABS tablet Take 1 tablet by mouth daily before lunch.    [provider]  olmesartan-hydrochlorothiazide (BENICAR HCT) 40-12.5 MG tablet Take 1 tablet by mouth daily. 02/18/22   Elenore Paddy, NP  Quercetin 500 MG CAPS Take by mouth.    [provider]  vitamin C (ASCORBIC ACID) 500 MG tablet Take 500 mg by mouth daily before lunch.    [provider]  Zinc Sulfate (ZINC 15 PO) Take by mouth daily.    [provider]    Family History Family History  Problem Relation Age of Onset   Diabetes Father    Heart failure Father    Diabetes Mother    Heart failure Mother    Hypertension Mother    Diabetes Sister    Colon cancer Neg Hx     Social History Social History   Tobacco Use   Smoking status: Never    Smokeless tobacco: Never  Vaping Use   Vaping status: Never Used  Substance Use Topics   Alcohol use: Yes    Comment: occ   Drug use: No     Allergies   Bee pollen and Pollen extract-tree extract   Review of Systems Review of Systems Per HPI  Physical Exam Triage Vital Signs ED Triage Vitals  Encounter Vitals Group     BP 08/31/22 1805 (!) 181/97     Systolic BP Percentile --      Diastolic BP Percentile --      Pulse Rate 08/31/22 1805 79     Resp 08/31/22 1805 13     Temp 08/31/22 1805 98.2 F (36.8 C)     Temp Source 08/31/22 1805 Oral     SpO2 08/31/22 1805 97 %     Weight --      Height --      Head Circumference --      Peak Flow --      Pain Score 08/31/22 1808 2     Pain Loc --      Pain Education --      Exclude from Growth Chart --    No data found.  Updated Vital Signs BP (!) 181/97 (BP Location: Right Arm)   Pulse 79   Temp 98.2 F (36.8 C) (Oral)   Resp 13   SpO2 97%   Visual Acuity Right Eye Distance:   Left Eye Distance:   Bilateral Distance:    Right Eye Near:   Left Eye Near:    Bilateral Near:     Physical Exam Vitals and nursing note reviewed.  Constitutional:      Appearance: Normal appearance.  HENT:     Head: Atraumatic.  Eyes:     Extraocular Movements: Extraocular movements intact.     Conjunctiva/sclera: Conjunctivae normal.  Cardiovascular:     Rate and Rhythm: Normal rate and regular rhythm.  Pulmonary:     Effort: Pulmonary effort is normal.     Breath sounds: Normal breath sounds.  Musculoskeletal:        General: Swelling and tenderness present. No deformity. Normal range of motion.     Cervical back: Normal range of motion and neck supple.     Comments: Trace edema to the left ankle diffusely.  Mildly tender to palpation to the anterior aspect of left ankle  Skin:    General: Skin is warm and dry.     Findings: No bruising or erythema.  Neurological:     General: No focal deficit present.     Mental  Status: He is oriented to person, place,  and time.     Comments: Left lower extremity neurovascularly intact  Psychiatric:        Mood and Affect: Mood normal.        Thought Content: Thought content normal.        Judgment: Judgment normal.      UC Treatments / Results  Labs (all labs ordered are listed, but only abnormal results are displayed) Labs Reviewed - No data to display  EKG   Radiology DG Ankle Complete Left  Result Date: 08/31/2022 CLINICAL DATA:  Two week history of left ankle pain and swelling. No injury. EXAM: LEFT ANKLE COMPLETE - 3+ VIEW COMPARISON:  None Available. FINDINGS: Mild degenerative changes in the left ankle with small osteophyte formation. Joint space is normal. No evidence of acute fracture or dislocation. No focal bone lesion or bone destruction. Degenerative changes in the intertarsal joints. Plantar and Achilles calcaneal spurs with soft tissue calcification anterior to the plantar spur suggesting plantar fasciitis. IMPRESSION: 1. No acute bony abnormalities. 2. Mild degenerative changes. 3. Calcaneal spurs with soft tissue calcification suggesting plantar fasciitis. Electronically Signed   By: Burman Nieves M.D.   On: 08/31/2022 18:38    Procedures Procedures (including critical care time)  Medications Ordered in UC Medications - No data to display  Initial Impression / Assessment and Plan / UC Course  I have reviewed the triage vital signs and the nursing notes.  Pertinent labs & imaging results that were available during my care of the patient were reviewed by me and considered in my medical decision making (see chart for details).     X-ray negative for acute bony abnormality, suspect strain/sprain.  Treat with Ace wrap, ice, elevation, compression, over-the-counter pain relievers.  Return for worsening symptoms.  Final Clinical Impressions(s) / UC Diagnoses   Final diagnoses:  Ankle swelling, left     Discharge Instructions       Ice, elevate, compression wraps, resting, anti-inflammatories.  Follow-up with orthopedics if not resolving or worsening    ED Prescriptions   None    PDMP not reviewed this encounter.   Particia Nearing, [place] 09/01/22 1457

## 2022-09-20 ENCOUNTER — Other Ambulatory Visit: Payer: Self-pay | Admitting: Nurse Practitioner

## 2022-09-20 DIAGNOSIS — E119 Type 2 diabetes mellitus without complications: Secondary | ICD-10-CM

## 2022-09-23 NOTE — Telephone Encounter (Signed)
LMOVM to call back on named VM

## 2022-11-30 ENCOUNTER — Other Ambulatory Visit: Payer: Self-pay | Admitting: Family

## 2022-11-30 ENCOUNTER — Other Ambulatory Visit: Payer: Self-pay | Admitting: Nurse Practitioner

## 2022-11-30 DIAGNOSIS — E1165 Type 2 diabetes mellitus with hyperglycemia: Secondary | ICD-10-CM

## 2022-11-30 MED ORDER — GLIPIZIDE 10 MG PO TABS
10.0000 mg | ORAL_TABLET | Freq: Every day | ORAL | 0 refills | Status: DC
Start: 2022-11-30 — End: 2023-04-13

## 2022-12-08 DIAGNOSIS — N1831 Chronic kidney disease, stage 3a: Secondary | ICD-10-CM | POA: Diagnosis not present

## 2022-12-08 DIAGNOSIS — E1122 Type 2 diabetes mellitus with diabetic chronic kidney disease: Secondary | ICD-10-CM | POA: Diagnosis not present

## 2022-12-08 DIAGNOSIS — I129 Hypertensive chronic kidney disease with stage 1 through stage 4 chronic kidney disease, or unspecified chronic kidney disease: Secondary | ICD-10-CM | POA: Diagnosis not present

## 2022-12-09 LAB — LAB REPORT - SCANNED: EGFR: 39

## 2022-12-29 ENCOUNTER — Ambulatory Visit: Payer: 59 | Admitting: Nurse Practitioner

## 2022-12-29 VITALS — BP 138/78 | HR 73 | Temp 97.8°F | Ht 73.0 in | Wt 232.4 lb

## 2022-12-29 DIAGNOSIS — Z7984 Long term (current) use of oral hypoglycemic drugs: Secondary | ICD-10-CM | POA: Diagnosis not present

## 2022-12-29 DIAGNOSIS — I10 Essential (primary) hypertension: Secondary | ICD-10-CM

## 2022-12-29 DIAGNOSIS — E1165 Type 2 diabetes mellitus with hyperglycemia: Secondary | ICD-10-CM | POA: Diagnosis not present

## 2022-12-29 DIAGNOSIS — E782 Mixed hyperlipidemia: Secondary | ICD-10-CM | POA: Diagnosis not present

## 2022-12-29 DIAGNOSIS — N1832 Chronic kidney disease, stage 3b: Secondary | ICD-10-CM

## 2022-12-29 LAB — LDL CHOLESTEROL, DIRECT: Direct LDL: 147 mg/dL

## 2022-12-29 LAB — COMPREHENSIVE METABOLIC PANEL
ALT: 13 U/L (ref 0–53)
AST: 17 U/L (ref 0–37)
Albumin: 4.3 g/dL (ref 3.5–5.2)
Alkaline Phosphatase: 29 U/L — ABNORMAL LOW (ref 39–117)
BUN: 31 mg/dL — ABNORMAL HIGH (ref 6–23)
CO2: 30 meq/L (ref 19–32)
Calcium: 10 mg/dL (ref 8.4–10.5)
Chloride: 98 meq/L (ref 96–112)
Creatinine, Ser: 1.75 mg/dL — ABNORMAL HIGH (ref 0.40–1.50)
GFR: 42.51 mL/min — ABNORMAL LOW (ref >60.00)
Glucose, Bld: 209 mg/dL — ABNORMAL HIGH (ref 70–99)
Potassium: 4.3 meq/L (ref 3.5–5.1)
Sodium: 134 meq/L — ABNORMAL LOW (ref 135–145)
Total Bilirubin: 0.5 mg/dL (ref 0.2–1.2)
Total Protein: 7 g/dL (ref 6.0–8.3)

## 2022-12-29 LAB — LIPID PANEL
Cholesterol: 234 mg/dL — ABNORMAL HIGH (ref 0–200)
HDL: 32.1 mg/dL — ABNORMAL LOW (ref >39.00)
Total CHOL/HDL Ratio: 7
Triglycerides: 470 mg/dL — ABNORMAL HIGH (ref 0.0–149.0)

## 2022-12-29 LAB — HEMOGLOBIN A1C: Hgb A1c MFr Bld: 9.1 % — ABNORMAL HIGH (ref 4.6–6.5)

## 2022-12-29 NOTE — Progress Notes (Signed)
Established Patient Office Visit  Subjective   Patient ID: Karl Atkins, male    DOB: 1965/09/03  Age: 57 y.o. MRN: [id number]  Chief Complaint  Patient presents with   Diabetes    T2DM/HTN/HLD/CKD: Patient has hypertension, type 2 diabetes with associated hyperlipidemia as well as chronic kidney disease.  He continues to follow with nephrology.  Last visit with nephrology identified A1c of 8.8.  He continues on 10 mg of glipizide daily, Jardiance 10 mg daily, metformin 1000 mg twice a day, olmesartan with HCTZ, metoprolol, male fibrate.  Tolerating medications well.  Last LDL 105.    Review of Systems  Respiratory:  Negative for shortness of breath.   Cardiovascular:  Negative for chest pain.      Objective:     BP 138/78   Pulse 73   Temp 97.8 F (36.6 C) (Temporal)   Ht 6\' 1"  (1.854 m)   Wt 232 lb 6 oz (105.4 kg)   SpO2 97%   BMI 30.66 kg/m  BP Readings from Last 3 Encounters:  12/29/22 138/78  08/31/22 (!) 181/97  08/05/22 (!) 202/108   Wt Readings from Last 3 Encounters:  12/29/22 232 lb 6 oz (105.4 kg)  08/05/22 234 lb (106.1 kg)  05/26/22 226 lb (102.5 kg)      Physical Exam Vitals reviewed.  Constitutional:      Appearance: Normal appearance.  HENT:     Head: Normocephalic and atraumatic.  Cardiovascular:     Rate and Rhythm: Normal rate and regular rhythm.  Pulmonary:     Effort: Pulmonary effort is normal.     Breath sounds: Normal breath sounds.  Musculoskeletal:     Cervical back: Neck supple.  Skin:    General: Skin is warm and dry.  Neurological:     Mental Status: He is alert and oriented to person, place, and time.  Psychiatric:        Mood and Affect: Mood normal.        Behavior: Behavior normal.        Thought Content: Thought content normal.        Judgment: Judgment normal.      No results found for any visits on 12/29/22.    The 10-year ASCVD risk score (Arnett DK, et al., 2019) is: 17.6%    Assessment & Plan:    Problem List Items Addressed This Visit       Cardiovascular and Mediastinum   Hypertension - Primary    Chronic, stable, controlled Continue metoprolol 50 mg daily and olmesartan-HCTZ 40-12.5 mg daily. Check CMP today.      Relevant Orders   Hemoglobin A1c   Lipid panel   Comprehensive metabolic panel     Endocrine   Type 2 diabetes mellitus with hyperglycemia (HCC)    Chronic Check A1c today.  Will anticipate possibly starting Trulicity if A1c above 7.  Patient denies any personal family history of thyroid cancer as well as pancreatitis.  Has had his gallbladder removed.  We discussed blackbox warning as well as other side effects and risks associated with Trulicity.  We also discussed injection techniques.  No additional questions asked by patient today.  Handout provided to him. In the meantime patient will also continue glipizide 10 mg daily, Jardiance 10 mg daily, and metformin 1000 mg twice a day.  Continue ARB and continue fenofibrate.      Relevant Orders   Hemoglobin A1c   Lipid panel   Comprehensive metabolic panel  Genitourinary   CKD stage 3b, GFR 30-44 ml/min (HCC)    Chronic, stable Continue to follow with nephrology as scheduled We will focus on maintaining healthy blood pressure as well as improving blood sugars.        Other   Hyperlipidemia    Chronic Check lipid panel, further recommendations may be made based upon the results       Return in about 6 weeks (around 02/09/2023) for 6-8 weeks F/U with Maralyn Sago.    Elenore Paddy, NP

## 2022-12-29 NOTE — Assessment & Plan Note (Signed)
Chronic Check A1c today.  Will anticipate possibly starting Trulicity if A1c above 7.  Patient denies any personal family history of thyroid cancer as well as pancreatitis.  Has had his gallbladder removed.  We discussed blackbox warning as well as other side effects and risks associated with Trulicity.  We also discussed injection techniques.  No additional questions asked by patient today.  Handout provided to him. In the meantime patient will also continue glipizide 10 mg daily, Jardiance 10 mg daily, and metformin 1000 mg twice a day.  Continue ARB and continue fenofibrate.

## 2022-12-29 NOTE — Assessment & Plan Note (Signed)
Chronic, stable, controlled Continue metoprolol 50 mg daily and olmesartan-HCTZ 40-12.5 mg daily. Check CMP today.

## 2022-12-29 NOTE — Assessment & Plan Note (Signed)
Chronic Check lipid panel, further recommendations may be made based upon the results

## 2022-12-29 NOTE — Assessment & Plan Note (Addendum)
Chronic, stable Continue to follow with nephrology as scheduled We will focus on maintaining healthy blood pressure as well as improving blood sugars.

## 2022-12-30 ENCOUNTER — Other Ambulatory Visit: Payer: Self-pay | Admitting: Nurse Practitioner

## 2022-12-30 DIAGNOSIS — E1165 Type 2 diabetes mellitus with hyperglycemia: Secondary | ICD-10-CM

## 2022-12-30 MED ORDER — TRULICITY 0.75 MG/0.5ML ~~LOC~~ SOAJ: 0.7500 mg | SUBCUTANEOUS | 1 refills | Status: DC

## 2023-01-09 ENCOUNTER — Other Ambulatory Visit: Payer: Self-pay | Admitting: Nurse Practitioner

## 2023-01-09 ENCOUNTER — Telehealth: Payer: Self-pay | Admitting: Nurse Practitioner

## 2023-01-09 DIAGNOSIS — E119 Type 2 diabetes mellitus without complications: Secondary | ICD-10-CM

## 2023-01-09 NOTE — Telephone Encounter (Signed)
Left detail message for pt about provider changing his medication. Lowering the dosage of his metformin to 500 mg and it being send to his pharmacy, to give [place] a  call if he has any other questions.

## 2023-01-09 NOTE — Telephone Encounter (Signed)
Please call patient and let him know that I have received a refill request for his Metformin. Based on his last kidney testing results I have to lower the dose of Metformin to 500mg  by mouth twice a day. I have sent in 500mg  tablets so when he fills the new tablets he will take 1 tablet by mouth with his morning meal and 1 tablet by mouth with dinner. Please let me know if he has any questions.

## 2023-02-09 ENCOUNTER — Ambulatory Visit: Payer: 59 | Admitting: Nurse Practitioner

## 2023-02-09 VITALS — BP 134/86 | Temp 97.8°F | Ht 73.0 in | Wt 232.4 lb

## 2023-02-09 DIAGNOSIS — E1165 Type 2 diabetes mellitus with hyperglycemia: Secondary | ICD-10-CM | POA: Diagnosis not present

## 2023-02-09 DIAGNOSIS — Z7985 Long-term (current) use of injectable non-insulin antidiabetic drugs: Secondary | ICD-10-CM | POA: Diagnosis not present

## 2023-02-09 DIAGNOSIS — I10 Essential (primary) hypertension: Secondary | ICD-10-CM

## 2023-02-09 DIAGNOSIS — J069 Acute upper respiratory infection, unspecified: Secondary | ICD-10-CM

## 2023-02-09 MED ORDER — HYDROCODONE BIT-HOMATROP MBR 5-1.5 MG/5ML PO SOLN: 5.0000 mL | Freq: Every evening | ORAL | 0 refills | Status: AC | PRN

## 2023-02-09 MED ORDER — TRULICITY 1.5 MG/0.5ML ~~LOC~~ SOAJ: 1.5000 mg | SUBCUTANEOUS | 1 refills | Status: DC

## 2023-02-09 MED ORDER — AZITHROMYCIN 250 MG PO TABS: ORAL_TABLET | ORAL | 0 refills | Status: AC

## 2023-02-09 MED ORDER — PREDNISONE 20 MG PO TABS: 40.0000 mg | ORAL_TABLET | Freq: Every day | ORAL | 0 refills | Status: DC

## 2023-02-09 NOTE — Assessment & Plan Note (Signed)
Acute Likely viral in origin.  For now focus on symptom management with as needed DayQuil for cough during the day.  Due to report of frequent coughing will prescribe Hycodan cough syrup that he can take at bedtime as needed, treat with short course of prednisone.  He was cautioned to make sure he monitors blood sugar on a regular basis while taking prednisone.  If he notices blood sugar 250 or higher he should discontinue the prednisone.  He reports understanding.  We also discussed risks and side effects associate prednisone and what to do if these were to occur.  We also discussed how to take the prednisone to alleviate these risks, specifically take prednisone with food and avoid NSAIDs while taking prednisone.  He reports his understanding. He was told if symptoms persist an additional 7 to 10 days or if they get drastically worse over the next few days he can take a Z-Pak for treatment.  Z-Pak sent into pharmacy so patient may have on hand if needed.

## 2023-02-09 NOTE — Assessment & Plan Note (Signed)
Chronic, stable, controlled Continue metoprolol 50 mg daily and olmesartan hydrochlorothiazide 40-12.5 mg daily.

## 2023-02-09 NOTE — Assessment & Plan Note (Signed)
Chronic, suboptimally controlled Per shared decision making will increase Trulicity to 1.5 mg/week.  He will also continue on glipizide 10 mg daily, Jardiance 10 mg daily, metformin 500 mg twice a day.  He was told if he has 2 more episodes of hypoglycemia between now and his next appointment to call this office at which point I will anticipate likely discontinuing glipizide.  He was also encouraged to monitor blood sugar on a regular basis.  He reports understanding.  Continue on olmesartan and continue fenofibrate.  Plan to check A1c at next office visit.

## 2023-02-09 NOTE — Progress Notes (Signed)
Established Patient Office Visit  Subjective   Patient ID: Karl Atkins, male    DOB: [birth year]  Age: 57 y.o. MRN: [id number]  Chief Complaint  Patient presents with   Medical Management of Chronic Issues    6 weeks follow up, chest congestion for about three days, Been taking OTC meds (mucinex and dayquil)    Cough: Symptom onset 3 to 4 days ago.  Reports frequent cough and malaise.  Denies shortness of breath or wheezing.  No fever or chills.  No headaches or myalgias.  Declined offered to test for COVID, flu, RSV.    Type 2 diabetes: Last A1c above goal at 9.1.  Goal is less than 7.  Started Trulicity 0.75 mg weekly injection after last office visit.  So far tolerating well.  Reports 1 day had not eaten much and started having episodes of hypoglycemia, symptoms resolved with food intake.  Otherwise tolerating well.  He also continues on glipizide 10 mg daily with breakfast, Jardiance 10 mg daily, metformin 500 mg twice a day.  On olmesartan hydrochlorothiazide for blood pressure control.  On fenofibrate to manage cholesterol.  Denies checking blood sugar regularly.    Review of Systems  Constitutional:  Negative for chills and fever.  Respiratory:  Positive for cough. Negative for shortness of breath and wheezing.   Cardiovascular:  Negative for chest pain.  Musculoskeletal:  Negative for myalgias.  Neurological:  Negative for headaches.      Objective:     BP 134/86   Temp 97.8 F (36.6 C) (Temporal)   Ht 6\' 1"  (1.854 m)   Wt 232 lb 6 oz (105.4 kg)   BMI 30.66 kg/m  BP Readings from Last 3 Encounters:  02/09/23 134/86  12/29/22 138/78  08/31/22 (!) 181/97   Wt Readings from Last 3 Encounters:  02/09/23 232 lb 6 oz (105.4 kg)  12/29/22 232 lb 6 oz (105.4 kg)  08/05/22 234 lb (106.1 kg)      Physical Exam Vitals reviewed.  Constitutional:      Appearance: Normal appearance.  HENT:     Head: Normocephalic and atraumatic.  Cardiovascular:     Rate and  Rhythm: Normal rate and regular rhythm.  Pulmonary:     Effort: Pulmonary effort is normal.     Breath sounds: Wheezing present.     Comments: Faint expiratory wheezing noted in upper lung fields Musculoskeletal:     Cervical back: Neck supple.  Skin:    General: Skin is warm and dry.  Neurological:     Mental Status: He is alert and oriented to person, place, and time.  Psychiatric:        Mood and Affect: Mood normal.        Behavior: Behavior normal.        Thought Content: Thought content normal.        Judgment: Judgment normal.      No results found for any visits on 02/09/23.    The 10-year ASCVD risk score (Arnett DK, et al., 2019) is: 26.9%    Assessment & Plan:   Problem List Items Addressed This Visit       Cardiovascular and Mediastinum   Hypertension   Chronic, stable, controlled Continue metoprolol 50 mg daily and olmesartan hydrochlorothiazide 40-12.5 mg daily.        Respiratory   Upper respiratory tract infection - Primary   Acute Likely viral in origin.  For now focus on symptom management with as needed DayQuil  for cough during the day.  Due to report of frequent coughing will prescribe Hycodan cough syrup that he can take at bedtime as needed, treat with short course of prednisone.  He was cautioned to make sure he monitors blood sugar on a regular basis while taking prednisone.  If he notices blood sugar 250 or higher he should discontinue the prednisone.  He reports understanding.  We also discussed risks and side effects associate prednisone and what to do if these were to occur.  We also discussed how to take the prednisone to alleviate these risks, specifically take prednisone with food and avoid NSAIDs while taking prednisone.  He reports his understanding. He was told if symptoms persist an additional 7 to 10 days or if they get drastically worse over the next few days he can take a Z-Pak for treatment.  Z-Pak sent into pharmacy so patient may  have on hand if needed.      Relevant Medications   azithromycin (ZITHROMAX) 250 MG tablet   predniSONE (DELTASONE) 20 MG tablet   HYDROcodone bit-homatropine (HYCODAN) 5-1.5 MG/5ML syrup     Endocrine   Type 2 diabetes mellitus with hyperglycemia (HCC)   Chronic, suboptimally controlled Per shared decision making will increase Trulicity to 1.5 mg/week.  He will also continue on glipizide 10 mg daily, Jardiance 10 mg daily, metformin 500 mg twice a day.  He was told if he has 2 more episodes of hypoglycemia between now and his next appointment to call this office at which point I will anticipate likely discontinuing glipizide.  He was also encouraged to monitor blood sugar on a regular basis.  He reports understanding.  Continue on olmesartan and continue fenofibrate.  Plan to check A1c at next office visit.      Relevant Medications   Dulaglutide (TRULICITY) 1.5 MG/0.5ML SOAJ    Return in about 6 weeks (around 03/23/2023) for F/U with Maralyn Sago.    Elenore Paddy, NP

## 2023-03-02 ENCOUNTER — Other Ambulatory Visit: Payer: Self-pay | Admitting: Nurse Practitioner

## 2023-03-02 DIAGNOSIS — I10 Essential (primary) hypertension: Secondary | ICD-10-CM

## 2023-03-03 ENCOUNTER — Ambulatory Visit
Admission: RE | Admit: 2023-03-03 | Discharge: 2023-03-03 | Disposition: A | Discharge status: Home / Self Care | Payer: 59 | Source: Ambulatory Visit | Attending: Nurse Practitioner | Admitting: Nurse Practitioner

## 2023-03-03 VITALS — BP 162/95 | HR 83 | Temp 98.7°F | Resp 16

## 2023-03-03 DIAGNOSIS — B0229 Other postherpetic nervous system involvement: Secondary | ICD-10-CM | POA: Diagnosis not present

## 2023-03-03 DIAGNOSIS — B029 Zoster without complications: Secondary | ICD-10-CM

## 2023-03-03 HISTORY — DX: Zoster without complications: B02.9

## 2023-03-03 MED ORDER — VALACYCLOVIR HCL 1 G PO TABS: 1000.0000 mg | ORAL_TABLET | Freq: Three times a day (TID) | ORAL | 0 refills | Status: DC

## 2023-03-03 MED ORDER — PREDNISONE 20 MG PO TABS: 40.0000 mg | ORAL_TABLET | Freq: Every day | ORAL | 0 refills | Status: AC

## 2023-03-03 NOTE — ED Triage Notes (Signed)
Pt reports he has a shingles out break on his right mid to low abdominal area x 2 days

## 2023-03-03 NOTE — Discharge Instructions (Addendum)
Your symptoms appear to be consistent with shingles. Take medication as prescribed. May take over-the-counter Tylenol as needed for pain, fever, general discomfort. Increase fluids and allow for plenty of rest. May cover the areas on your chest with cool cloths to help with itching, pain, or discomfort. Do not scratch or irritate the areas while symptoms persist. If the areas begin to ooze or blister, keep them covered. As discussed, after the rash resolves, if you continue to experience a "nerve pain" in the area, please follow-up with your primary care physician for further evaluation. Please follow-up with your PCP to discuss administration of the shingles vaccine. Follow-up as needed.

## 2023-03-04 NOTE — ED Provider Notes (Signed)
[facility]    CSN: [id number] Arrival date & time: 03/03/23  1820      History   Chief Complaint Chief Complaint  Patient presents with   Rash    I have painful splotchy rash spots on my stomach, side and back.  It looks similar to shingles - Entered by patient    HPI Karl Atkins is a 58 y.o. male.   The history is provided by the patient.   Patient presents for concerns for shingles.  States that he has a "splotchy rash on his Past Medical History:  Diagnosis Date   Diabetic neuropathy (HCC) 05/16/2016   Essential hypertension    Headache    hx migraines mid 1990's   Hyperlipidemia    Obesity    Shingles    Type 2 diabetes mellitus (HCC) 2011    Patient Active Problem List   Diagnosis Date Noted   Upper respiratory tract infection 02/09/2023   CKD stage 3b, GFR 30-44 ml/min (HCC) 12/29/2022   RUQ pain 05/03/2022   Infected wound 02/03/2022   Vitamin D deficiency disease 11/18/2021   Displacement of lumbar intervertebral disc 06/18/2021   Other specified joint disorders, left hip 06/18/2021   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   Kidney dysfunction 06/18/2021   Chest wall pain 05/28/2021   Tinea cruris 05/06/2021   Rash 05/06/2021   Biliary dyskinesia 04/29/2020   Chronic cholecystitis    Abdominal pain 02/27/2020   History of colonic polyps 02/27/2020   Diabetic neuropathy (HCC) 05/16/2016   Hypertension    Hyperlipidemia    Type 2 diabetes mellitus with hyperglycemia (HCC) 04/09/2010   OBESITY 04/09/2010    Past Surgical History:  Procedure Laterality Date   BACK SURGERY  2014   lower back   CHOLECYSTECTOMY N/A 03/13/2020   Procedure: LAPAROSCOPIC CHOLECYSTECTOMY;  Surgeon: Franky Macho, MD;  Location: [facility];  Service: General;  Laterality: N/A;   COLONOSCOPY WITH PROPOFOL N/A 04/19/2016   Surgeon: Charolett Bumpers, MD; Two 6 mm tubular adenomas.   MIDDLE EAR SURGERY     x2       Home Medications    Prior to Admission  medications   Medication Sig Start Date End Date Taking? Authorizing Provider  predniSONE (DELTASONE) 20 MG tablet Take 2 tablets (40 mg total) by mouth daily with breakfast for 5 days. 03/03/23 03/08/23 Yes Leath-Warren, Sadie Haber, NP  valACYclovir (VALTREX) 1000 MG tablet Take 1 tablet (1,000 mg total) by mouth 3 (three) times daily. 03/03/23  Yes Leath-Warren, Sadie Haber, NP  cetirizine (ZYRTEC) 5 MG tablet Take 5 mg by mouth daily.    [provider]  Cholecalciferol (VITAMIN D3) 125 MCG (5000 UT) CAPS Take 2 capsules by mouth daily.    [provider]  doxycycline (VIBRAMYCIN) 100 MG capsule Take 100 mg by mouth 2 (two) times daily. 07/22/22   [provider]  Dulaglutide (TRULICITY) 1.5 MG/0.5ML SOAJ Inject 1.5 mg into the skin once a week. 02/09/23   Elenore Paddy, NP  fenofibrate (TRICOR) 145 MG tablet TAKE ONE TABLET (145 MG TOTAL) BY MOUTH DAILY. 07/12/22   Elenore Paddy, NP  glipiZIDE (GLUCOTROL) 10 MG tablet Take 1 tablet (10 mg total) by mouth daily before breakfast. 11/30/22   Worthy Rancher B, FNP  HYDROcodone bit-homatropine (HYCODAN) 5-1.5 MG/5ML syrup Take 5 mLs by mouth at bedtime as needed for cough. 02/09/23   Elenore Paddy, NP  JARDIANCE 10 MG TABS tablet Take 10 mg by  mouth daily. 04/23/22   [provider]  metFORMIN (GLUCOPHAGE) 500 MG tablet Take 1 tablet (500 mg total) by mouth 2 (two) times daily with a meal. 01/09/23   Elenore Paddy, NP  metoprolol succinate (TOPROL-XL) 50 MG 24 hr tablet Take 1 tablet (50 mg total) by mouth daily. 05/26/22   Elenore Paddy, NP  Multiple Vitamin (MULTIVITAMIN WITH MINERALS) TABS tablet Take 1 tablet by mouth daily before lunch.    [provider]  olmesartan-hydrochlorothiazide (BENICAR HCT) 40-12.5 MG tablet TAKE ONE (1) TABLET BY MOUTH EVERY DAY 03/02/23   Elenore Paddy, NP  Quercetin 500 MG CAPS Take by mouth.    [provider]  vitamin C (ASCORBIC ACID) 500 MG tablet Take 500 mg by mouth  daily before lunch.    [provider]  Zinc Sulfate (ZINC 15 PO) Take by mouth daily.    [provider]    Family History Family History  Problem Relation Age of Onset   Diabetes Father    Heart failure Father    Diabetes Mother    Heart failure Mother    Hypertension Mother    Diabetes Sister    Colon cancer Neg Hx     Social History Social History   Tobacco Use   Smoking status: Never   Smokeless tobacco: Never  Vaping Use   Vaping status: Never Used  Substance Use Topics   Alcohol use: Yes    Comment: occ   Drug use: No     Allergies   Bee pollen and Pollen extract-tree extract   Review of Systems Review of Systems   Physical Exam Triage Vital Signs ED Triage Vitals [03/03/23 1836]  Encounter Vitals Group     BP (!) 162/95     Systolic BP Percentile      Diastolic BP Percentile      Pulse Rate 83     Resp 16     Temp 98.7 F (37.1 C)     Temp Source Oral     SpO2 97 %     Weight      Height      Head Circumference      Peak Flow      Pain Score 5     Pain Loc      Pain Education      Exclude from Growth Chart    No data found.  Updated Vital Signs BP (!) 162/95 (BP Location: Right Arm)   Pulse 83   Temp 98.7 F (37.1 C) (Oral)   Resp 16   SpO2 97%   Visual Acuity Right Eye Distance:   Left Eye Distance:   Bilateral Distance:    Right Eye Near:   Left Eye Near:    Bilateral Near:     Physical Exam   UC Treatments / Results  Labs (all labs ordered are listed, but only abnormal results are displayed) Labs Reviewed - No data to display  EKG   Radiology No results found.  Procedures Procedures (including critical care time)  Medications Ordered in UC Medications - No data to display  Initial Impression / Assessment and Plan / UC Course  I have reviewed the triage vital signs and the nursing notes.  Pertinent labs & imaging results that were available during my care of the patient were reviewed by  me and considered in my medical decision making (see chart for details).     *** Final Clinical Impressions(s) / UC  Diagnoses   Final diagnoses:  Herpes zoster without complication  Post herpetic neuralgia     Discharge Instructions      Your symptoms appear to be consistent with shingles. Take medication as prescribed. May take over-the-counter Tylenol as needed for pain, fever, general discomfort. Increase fluids and allow for plenty of rest. May cover the areas on your chest with cool cloths to help with itching, pain, or discomfort. Do not scratch or irritate the areas while symptoms persist. If the areas begin to ooze or blister, keep them covered. As discussed, after the rash resolves, if you continue to experience a "nerve pain" in the area, please follow-up with your primary care physician for further evaluation. Please follow-up with your PCP to discuss administration of the shingles vaccine. Follow-up as needed.    ED Prescriptions     Medication Sig Dispense Auth. Provider   valACYclovir (VALTREX) 1000 MG tablet Take 1 tablet (1,000 mg total) by mouth 3 (three) times daily. 21 tablet Leath-Warren, Sadie Haber, NP   predniSONE (DELTASONE) 20 MG tablet Take 2 tablets (40 mg total) by mouth daily with breakfast for 5 days. 10 tablet Leath-Warren, Sadie Haber, NP      PDMP not reviewed this encounter.

## 2023-03-16 ENCOUNTER — Encounter: Payer: Self-pay | Admitting: Nurse Practitioner

## 2023-03-17 ENCOUNTER — Other Ambulatory Visit: Payer: Self-pay | Admitting: Nurse Practitioner

## 2023-03-17 DIAGNOSIS — B0229 Other postherpetic nervous system involvement: Secondary | ICD-10-CM

## 2023-03-17 MED ORDER — GABAPENTIN 300 MG PO CAPS: 300.0000 mg | ORAL_CAPSULE | Freq: Three times a day (TID) | ORAL | 3 refills | Status: DC

## 2023-03-22 ENCOUNTER — Ambulatory Visit: Payer: 59 | Admitting: Nurse Practitioner

## 2023-04-05 ENCOUNTER — Ambulatory Visit: Payer: 59 | Admitting: Nurse Practitioner

## 2023-04-13 ENCOUNTER — Ambulatory Visit: Payer: 59 | Admitting: Nurse Practitioner

## 2023-04-13 VITALS — BP 128/82 | HR 102 | Temp 98.7°F | Ht 73.0 in | Wt 222.0 lb

## 2023-04-13 DIAGNOSIS — Z7985 Long-term (current) use of injectable non-insulin antidiabetic drugs: Secondary | ICD-10-CM

## 2023-04-13 DIAGNOSIS — E1165 Type 2 diabetes mellitus with hyperglycemia: Secondary | ICD-10-CM

## 2023-04-13 DIAGNOSIS — R21 Rash and other nonspecific skin eruption: Secondary | ICD-10-CM | POA: Diagnosis not present

## 2023-04-13 LAB — POCT URINALYSIS DIPSTICK
Bilirubin, UA: NEGATIVE
Blood, UA: NEGATIVE
Glucose, UA: POSITIVE — AB
Ketones, UA: NEGATIVE
Leukocytes, UA: NEGATIVE
Nitrite, UA: NEGATIVE
Protein, UA: NEGATIVE
Spec Grav, UA: 1.01 (ref 1.010–1.025)
Urobilinogen, UA: NEGATIVE U/dL — AB
pH, UA: 6.5 (ref 5.0–8.0)

## 2023-04-13 LAB — POCT GLYCOSYLATED HEMOGLOBIN (HGB A1C): HbA1c POC (<> result, manual entry): 13.2 % (ref 4.0–5.6)

## 2023-04-13 MED ORDER — NYSTATIN 100000 UNIT/GM EX CREA: 1.0000 | TOPICAL_CREAM | Freq: Two times a day (BID) | CUTANEOUS | 2 refills | Status: DC

## 2023-04-13 MED ORDER — CLOTRIMAZOLE 1 % EX CREA: 1.0000 | TOPICAL_CREAM | Freq: Two times a day (BID) | CUTANEOUS | 0 refills | Status: AC

## 2023-04-13 MED ORDER — DEXCOM G7 SENSOR MISC: 3 refills | Status: DC

## 2023-04-13 MED ORDER — TRULICITY 3 MG/0.5ML ~~LOC~~ SOAJ: 3.0000 mg | SUBCUTANEOUS | 3 refills | Status: DC

## 2023-04-13 NOTE — Progress Notes (Signed)
 Established Patient Office Visit  Subjective   Patient ID: Karl Atkins, male    DOB: July 23, 1965  Age: 58 y.o. MRN: [id number]  Chief Complaint  Patient presents with   Follow-up   Diabetes    Type 2 diabetes: Patient is here for follow-up.  Recently increased Trulicity to 1.5 mg injection per week.  So far is tolerating well.  He also continues on glipizide 10 mg daily, Jardiance 10 mg daily, metformin 500 mg twice daily,.  He is on olmesartan hydrochlorothiazide.  He is on fenofibrate.  He continues to not check blood sugar on a regular basis.  He reports that he has been fairly noncompliant with low-carb diet and reports that he is eating foods regularly such as [employer] in's.  He also was treated with prednisone recently for upper respiratory infection.  He mentions recent weight loss.  Denies increased frequency in urination or polydipsia or polyphasia.  Rash: Reports turning over in bed and this caused shearing of skin around his buttock.  He now has a rash and a lesion that is painful that he would like to be evaluated today.    ROS: see HPI    Objective:     BP 128/82 (BP Location: Right Arm, Patient Position: Sitting, Cuff Size: Normal)   Pulse (!) 102   Temp 98.7 F (37.1 C) (Oral)   Ht 6\' 1"  (1.854 m)   Wt 222 lb (100.7 kg)   SpO2 96%   BMI 29.29 kg/m  BP Readings from Last 3 Encounters:  04/13/23 128/82  03/03/23 (!) 162/95  02/09/23 134/86   Wt Readings from Last 3 Encounters:  04/13/23 222 lb (100.7 kg)  02/09/23 232 lb 6 oz (105.4 kg)  12/29/22 232 lb 6 oz (105.4 kg)      Physical Exam Vitals reviewed.  Constitutional:      Appearance: Normal appearance.  HENT:     Head: Normocephalic and atraumatic.  Cardiovascular:     Rate and Rhythm: Normal rate and regular rhythm.  Pulmonary:     Effort: Pulmonary effort is normal.     Breath sounds: Normal breath sounds.  Musculoskeletal:     Cervical back: Neck supple.  Skin:    General: Skin  is warm and dry.          Comments: Hyperpigmented flat lesion along both sides of gluteal cleft, abrasion on right buttock noted  Neurological:     Mental Status: He is alert and oriented to person, place, and time.  Psychiatric:        Mood and Affect: Mood normal.        Behavior: Behavior normal.        Thought Content: Thought content normal.        Judgment: Judgment normal.      Results for orders placed or performed in visit on 04/13/23  POCT HgB A1C  Result Value Ref Range   Hemoglobin A1C     HbA1c POC (<> result, manual entry) 13.2 4.0 - 5.6 %   HbA1c, POC (prediabetic range)     HbA1c, POC (controlled diabetic range)    POCT urinalysis dipstick  Result Value Ref Range   Color, UA     Clarity, UA     Glucose, UA Positive (A) Negative   Bilirubin, UA negative    Ketones, UA negative    Spec Grav, UA 1.010 1.010 - 1.025   Blood, UA negative    pH, UA 6.5 5.0 -  8.0   Protein, UA Negative Negative   Urobilinogen, UA negative (A) 0.2 or 1.0 E.U./dL   Nitrite, UA negative    Leukocytes, UA Negative Negative   Appearance     Odor        The 10-year ASCVD risk score (Arnett DK, et al., 2019) is: 25.1%    Assessment & Plan:   Problem List Items Addressed This Visit       Endocrine   Type 2 diabetes mellitus with hyperglycemia (HCC) - Primary   Chronic, A1c increased from 9.1-13.2. Consulted with backup supervising physician (Dr. Yetta Barre).  Who recommended discontinuing glipizide and Jardiance, increasing Trulicity to 3 mg/week, and starting patient on basal bolus insulin (basal of 30 units once a day and bolus of 5 to 10 units 3 times a day with substantial meal).  Luckily urinalysis negative for ketones.  Unfortunately lab is closed at time of appointment so unable to check glucose level at this time, however patient is agreeable to coming in tomorrow morning to have CMP collected.  Will order stat CMP for patient to have collected tomorrow for further  evaluation.  Discussed recommended treatment changes and patient refuses insulin therapy.  He truly feels elevated blood sugars are related to dietary intake and recent prednisone use.  We discussed risk of ketoacidosis as well as hyperosmolar hyperglycemic state and that in severe cases if present and untreated can lead to death.  And that treatment is control of blood sugars with use of insulin.  He reports his understanding but continues to refuse insulin therapy.  Thus, we will increase Trulicity to 3 mg/week, we will still discontinue glipizide.  Will discontinue Jardiance hopefully only temporarily until blood sugars get better controlled and then would like to restart as this may help protect kidney function because he does have CKD at baseline as well.  Discontinuing Jardiance in hopes of reducing risk of UTI or yeast infection while blood sugars are uncontrolled.  Patient will continue on metformin, ARB, and fenofibrate.  Patient was told if CMP tomorrow shows any evidence of HHS or DKA he will need to be on insulin therapy and he is agreeable to start insulin based on CMP results.  While he is not on insulin therapy right now I do think he would benefit from continuous glucose monitor since he has been historically noncompliant with monitoring blood sugars with glucometer at home.  Will order Dexcom and he is provided with samples today to get started.  Will also refer to pharmaceutical services to assist with education and treatment.      Relevant Medications   Dulaglutide (TRULICITY) 3 MG/0.5ML SOAJ   Continuous Glucose Sensor (DEXCOM G7 SENSOR) MISC   Other Relevant Orders   POCT HgB A1C (Completed)   POCT urinalysis dipstick (Completed)   AMB Referral [facility] Care Management   Comprehensive metabolic panel     Musculoskeletal and Integument   Rash   Gluteal cleft shows evidence of either yeast or fungal overgrowth.  There is also an abrasion but no swelling, redness, purulent  drainage so I think unlikely has bacterial infection currently.  Will treat with topical Lotrimin and nystatin cream.  Patient was told to monitor himself for fever, chills, increased pain, purulent drainage, redness, swelling in the area and if this were to occur to reach out at which point treatment with antibiotic would be warranted.  He reports his understanding.      Relevant Medications   nystatin cream (MYCOSTATIN)   clotrimazole (  LOTRIMIN) 1 % cream    Return in about 1 month (around 05/11/2023) for F/U with Amelda Hapke.  Total time spent on encounter today was 64 minutes including face-to-face evaluation, review previous medical records, consultation with supervising physician, and extensive discussion of treatment plan with patient and wife who accompanies him today.   Elenore Paddy, NP

## 2023-04-13 NOTE — Assessment & Plan Note (Signed)
 Gluteal cleft shows evidence of either yeast or fungal overgrowth.  There is also an abrasion but no swelling, redness, purulent drainage so I think unlikely has bacterial infection currently.  Will treat with topical Lotrimin and nystatin cream.  Patient was told to monitor himself for fever, chills, increased pain, purulent drainage, redness, swelling in the area and if this were to occur to reach out at which point treatment with antibiotic would be warranted.  He reports his understanding.

## 2023-04-13 NOTE — Patient Instructions (Signed)
 STOP: jardiance and glipizide START: trulicity 3mg /weekly injection Use the Dexcom to watch blood sugars Really reduce processed carb intake and focus on protein and lean vegetables Pharmacy services will call to schedule an appointment to also help with education and medication management

## 2023-04-13 NOTE — Assessment & Plan Note (Signed)
 Chronic, A1c increased from 9.1-13.2. Consulted with backup supervising physician (Dr. Yetta Barre).  Who recommended discontinuing glipizide and Jardiance, increasing Trulicity to 3 mg/week, and starting patient on basal bolus insulin (basal of 30 units once a day and bolus of 5 to 10 units 3 times a day with substantial meal).  Luckily urinalysis negative for ketones.  Unfortunately lab is closed at time of appointment so unable to check glucose level at this time, however patient is agreeable to coming in tomorrow morning to have CMP collected.  Will order stat CMP for patient to have collected tomorrow for further evaluation.  Discussed recommended treatment changes and patient refuses insulin therapy.  He truly feels elevated blood sugars are related to dietary intake and recent prednisone use.  We discussed risk of ketoacidosis as well as hyperosmolar hyperglycemic state and that in severe cases if present and untreated can lead to death.  And that treatment is control of blood sugars with use of insulin.  He reports his understanding but continues to refuse insulin therapy.  Thus, we will increase Trulicity to 3 mg/week, we will still discontinue glipizide.  Will discontinue Jardiance hopefully only temporarily until blood sugars get better controlled and then would like to restart as this may help protect kidney function because he does have CKD at baseline as well.  Discontinuing Jardiance in hopes of reducing risk of UTI or yeast infection while blood sugars are uncontrolled.  Patient will continue on metformin, ARB, and fenofibrate.  Patient was told if CMP tomorrow shows any evidence of HHS or DKA he will need to be on insulin therapy and he is agreeable to start insulin based on CMP results.  While he is not on insulin therapy right now I do think he would benefit from continuous glucose monitor since he has been historically noncompliant with monitoring blood sugars with glucometer at home.  Will  order Dexcom and he is provided with samples today to get started.  Will also refer to pharmaceutical services to assist with education and treatment.

## 2023-04-14 ENCOUNTER — Other Ambulatory Visit (INDEPENDENT_AMBULATORY_CARE_PROVIDER_SITE_OTHER): Payer: 59

## 2023-04-14 ENCOUNTER — Other Ambulatory Visit: Payer: Self-pay | Admitting: Nurse Practitioner

## 2023-04-14 DIAGNOSIS — R1084 Generalized abdominal pain: Secondary | ICD-10-CM | POA: Diagnosis not present

## 2023-04-14 DIAGNOSIS — E1165 Type 2 diabetes mellitus with hyperglycemia: Secondary | ICD-10-CM | POA: Diagnosis not present

## 2023-04-14 DIAGNOSIS — I10 Essential (primary) hypertension: Secondary | ICD-10-CM

## 2023-04-14 DIAGNOSIS — E782 Mixed hyperlipidemia: Secondary | ICD-10-CM

## 2023-04-14 LAB — COMPREHENSIVE METABOLIC PANEL
ALT: 14 U/L (ref 0–53)
AST: 17 U/L (ref 0–37)
Albumin: 4.3 g/dL (ref 3.5–5.2)
Alkaline Phosphatase: 34 U/L — ABNORMAL LOW (ref 39–117)
BUN: 33 mg/dL — ABNORMAL HIGH (ref 6–23)
CO2: 28 meq/L (ref 19–32)
Calcium: 9.7 mg/dL (ref 8.4–10.5)
Chloride: 94 meq/L — ABNORMAL LOW (ref 96–112)
Creatinine, Ser: 2.01 mg/dL — ABNORMAL HIGH (ref 0.40–1.50)
GFR: 35.93 mL/min — ABNORMAL LOW (ref >60.00)
Glucose, Bld: 344 mg/dL — ABNORMAL HIGH (ref 70–99)
Potassium: 4.4 meq/L (ref 3.5–5.1)
Sodium: 130 meq/L — ABNORMAL LOW (ref 135–145)
Total Bilirubin: 0.7 mg/dL (ref 0.2–1.2)
Total Protein: 7.2 g/dL (ref 6.0–8.3)

## 2023-04-14 LAB — LIPASE: Lipase: 73 U/L — ABNORMAL HIGH (ref 11.0–59.0)

## 2023-04-14 MED ORDER — INSULIN PEN NEEDLE 31G X 5 MM MISC: 1.0000 | Freq: Every day | 1 refills | Status: AC

## 2023-04-14 MED ORDER — FENOFIBRATE 67 MG PO CAPS: 67.0000 mg | ORAL_CAPSULE | Freq: Every day | ORAL | 1 refills | Status: DC

## 2023-04-14 MED ORDER — INSULIN GLARGINE 100 UNITS/ML SOLOSTAR PEN: 25.0000 [IU] | PEN_INJECTOR | Freq: Every day | SUBCUTANEOUS | 1 refills | Status: DC

## 2023-04-14 NOTE — Progress Notes (Signed)
 Estimated Creatinine Clearance: 50 mL/min (A) (by C-G formula based on SCr of 2.01 mg/dL (H)).  Please call patient and inform him of the below. Please also cancel his upcoming follow-up and schedule him instead with me in 1-2 weeks anytime before 3:20pm so we have time to get repeat labs.   Diabetes: Labs resulted, they show blood sugar is quite elevated and well above 300.  I do recommend we start him on insulin, even if this is just temporary while he is working on dietary changes.  I am going to send in basal insulin called Lantus.  He will need to inject 25 units into subcutaneous tissue of his abdomen, upper arm, or inner thigh once every day.  He will take this in addition to his other medications for blood sugar.  Hopefully we can eventually taper off of the insulin if he really focuses on his diet and gets blood sugars under control, but I do think his blood sugars are dangerously elevated right now and we need to get them down quickly.  He needs to be using his Dexcom and/or glucometer to monitor blood sugars closely.  If he has any low blood sugars (blood sugar <70) he should treat this by drinking half a cup of juice or eating a snack and rechecking sugars in 15 minutes to make sure that they are improving and rising.   2. Abdominal/Back Pain: I also identified a slightly elevated lipase on labs which can sometimes indicate inflammation of the pancreas which is a potential side effect of Trulicity.  If with increasing the dose of Trulicity results in flank/back pain getting worse he needs to stop the Trulicity and let me know.  We will recheck this level when I see him at follow-up in 1 to 2 weeks.  I am also going to reduce his fenofibrate dose down to a 67 mg capsule due to his kidney function as it is slightly worsened compared to what it was last time.    Please let me know if he has any questions.  I have consulted with supervising physician regarding the above plan and she is  agreeable.

## 2023-04-17 ENCOUNTER — Telehealth: Payer: Self-pay

## 2023-04-17 NOTE — Progress Notes (Signed)
 Care Guide Pharmacy Note  04/17/2023 Name: [name] MRN: [id number] DOB: [birth year]  Referred By: Elenore Paddy, NP Reason for referral: Care Coordination (Outreach to schedule with Pharm d )   Karl Atkins is a 58 y.o. year old male who is a primary care patient of Elenore Paddy, NP.  [name] was referred to the pharmacist for assistance related to: DMII  Successful contact was made with the patient to discuss pharmacy services including being ready for the pharmacist to call at least 5 minutes before the scheduled appointment time and to have medication bottles and any blood pressure readings ready for review. The patient agreed to meet with the pharmacist via telephone visit on (date/time).04/19/2023  Penne Lash , RMA     [place]  [facility], [facility] Guide  Direct Dial: [phone]  Website: [email or web address]

## 2023-04-19 ENCOUNTER — Other Ambulatory Visit: Payer: Self-pay | Admitting: Nurse Practitioner

## 2023-04-19 ENCOUNTER — Other Ambulatory Visit (INDEPENDENT_AMBULATORY_CARE_PROVIDER_SITE_OTHER): Admitting: Pharmacist

## 2023-04-19 DIAGNOSIS — R1011 Right upper quadrant pain: Secondary | ICD-10-CM

## 2023-04-19 DIAGNOSIS — E1165 Type 2 diabetes mellitus with hyperglycemia: Secondary | ICD-10-CM

## 2023-04-19 DIAGNOSIS — E782 Mixed hyperlipidemia: Secondary | ICD-10-CM

## 2023-04-19 MED ORDER — TRESIBA FLEXTOUCH 100 UNIT/ML ~~LOC~~ SOPN: PEN_INJECTOR | SUBCUTANEOUS | 1 refills | Status: DC

## 2023-04-19 MED ORDER — ATORVASTATIN CALCIUM 40 MG PO TABS: 40.0000 mg | ORAL_TABLET | Freq: Every day | ORAL | 3 refills | Status: AC

## 2023-04-19 NOTE — Progress Notes (Signed)
 04/20/2023 Name: Karl Atkins MRN: [id number] DOB: 29-Dec-1965  Chief Complaint  Patient presents with   Diabetes   Medication Management    Karl Atkins is a 58 y.o. year old male who presented for a telephone visit.   They were referred to the pharmacist by their PCP for assistance in managing diabetes.   Subjective:  Care Team: Primary Care Provider: Elenore Paddy, NP ; Next Scheduled Visit: 3/20  Medication Access/Adherence  Current Pharmacy:  [facility] - [place], [place] - [address] [place] [address] Phone: [phone] Fax: [phone]  [facility] [id number] - [place], [place] - [address] AT [facility] [address] [place] [address] Phone: [phone] Fax: [phone]   Patient reports affordability concerns with their medications: No  Patient reports access/transportation concerns to their pharmacy: Yes  Patient reports adherence concerns with their medications:  Yes    Pt's wife states she has been unable to get the insulin from the pharmacy because of the amount prescribed is >30 day supply. Pt's insurance will only pay for a 30 DS.   Diabetes:  Current medications: Trulicity 3 mg daily (just started increased dose), metformin 500 mg BID *PCP prescribed Lantus, however pt has not started due to not being able to get it through insurance   Current glucose readings: BG 368 - took meds 1 hour, ate within the last 35-45 min Just got Dexcom G7, planning to get it set up today   Current meal patterns: Pt feels his BG are very elevated due to his eating habits and notes he feels his A1c will improve once he changes his diet.    Hyperlipidemia/ASCVD Risk Reduction  Current lipid lowering medications: fenofibrate 67 mg daily (just reduced due to renal function) Medications tried in the past: has not taken statin in the past   The 10-year ASCVD risk score (Arnett DK, et al., 2019) is:  25.1%   Values used to calculate the score:     Age: 25 years     Sex: Male     Is Non-Hispanic African American: No     Diabetic: Yes     Tobacco smoker: No     Systolic Blood Pressure: 128 mmHg     Is BP treated: Yes     HDL Cholesterol: 32.1 mg/dL     Total Cholesterol: 234 mg/dL   Pt notes he has a history of very high triglycerides. Has also had ongoing abdominal pain that he has been evaluated for before but has not found any answers for cause of the pain. He notes the pain has remained the same recently.  Objective:  Lab Results  Component Value Date   HGBA1C 13.2 04/13/2023    Lab Results  Component Value Date   CREATININE 2.01 (H) 04/14/2023   BUN 33 (H) 04/14/2023   NA 130 (L) 04/14/2023   K 4.4 04/14/2023   CL 94 (L) 04/14/2023   CO2 28 04/14/2023    Lab Results  Component Value Date   CHOL 234 (H) 12/29/2022   HDL 32.10 (L) 12/29/2022   LDLCALC 105 (H) 06/18/2020   LDLDIRECT 147.0 12/29/2022   TRIG (H) 12/29/2022    470.0 Triglyceride is over 400; calculations on Lipids are invalid.   CHOLHDL 7 12/29/2022    Medications Reviewed Today   Medications were not reviewed in this encounter       Assessment/Plan:  Diabetes: - Currently uncontrolled, A1c goal <7% - Extensively reviewed long term cardiovascular and renal outcomes of uncontrolled blood sugar - Reviewed goal A1c, goal fasting, and goal 2 hour post prandial glucose - Reviewed dietary modifications including increasing protein, carb heavy foods in moderation and with balanced meal/snack - Reviewed lifestyle modifications including: increased activity/exercise as able - Renal function declined on last CMP w/ GFR 36. Monitor renal function for GFR <30 - Recommend to start Tresiba 25 units daily. Continue Trulicity and metformin, consider Ozempic or Mounjaro in future if lipase remains stable - Start using Dexcom - will try to get connected using Clarity to better monitor his BG and adjust  medication - Pt to come in 3/10 for fasting labs to get repeat lipase and CMP  Hyperlipidemia/ASCVD Risk Reduction: - Currently uncontrolled. LDL goal <70 per ADA, TG goal <150 - Reviewed long term complications of uncontrolled cholesterol - Recommend to start atorvastatin 40 mg daily, continue fenofibrate 67 mg daily    Follow Up Plan: 3/13  Arbutus Leas, PharmD, BCPS, CPP Clinical Pharmacist Practitioner [place] [facility] at [facility] [phone]

## 2023-04-20 NOTE — Patient Instructions (Signed)
 It was a pleasure speaking with you today!  Start Tresiba 25 units daily. Continue Trulicity and metformin. Start using the Dexcom G7 to more closely monitor your blood sugars.  Come 3/10 for fasting labs to recheck your kidney function and lipase.  Start atorvastatin for cholesterol and heart protection.  Feel free to call with any questions or concerns!  Arbutus Leas, PharmD, BCPS, CPP Clinical Pharmacist Practitioner [place] [facility] at [facility] [phone]

## 2023-04-24 NOTE — Progress Notes (Signed)
 Made pt aware of providers note and medication that is send to pt pharmacy

## 2023-04-27 ENCOUNTER — Other Ambulatory Visit (INDEPENDENT_AMBULATORY_CARE_PROVIDER_SITE_OTHER): Admitting: Pharmacist

## 2023-04-27 DIAGNOSIS — E1165 Type 2 diabetes mellitus with hyperglycemia: Secondary | ICD-10-CM

## 2023-04-27 NOTE — Progress Notes (Signed)
 04/27/2023 Name: Karl Atkins MRN: [id number] DOB: [birth year]  Chief Complaint  Patient presents with   Diabetes   Medication Management    Karl Atkins is a 58 y.o. year old male who presented for a telephone visit.   They were referred to the pharmacist by their PCP for assistance in managing diabetes.   Subjective:  Care Team: Primary Care Provider: Elenore Paddy, NP ; Next Scheduled Visit: 3/20  Medication Access/Adherence  Current Pharmacy:  [facility] - [place], [place] - [address] [place] [address] Phone: [phone] Fax: [phone]  [facility] [id number] - [place], [place] - [address] AT [facility] [address] [place] [address] Phone: [phone] Fax: [phone]   Patient reports affordability concerns with their medications: No  Patient reports access/transportation concerns to their pharmacy: Yes  Patient reports adherence concerns with their medications:  Yes     Diabetes:  Current medications: Trulicity 3 mg daily (just started increased dose last week), metformin 500 mg BID, Tresiba 28 units daily (started 04/24/23)  Current glucose readings:  Just got Dexcom G7 set up on Tuesday 3/10 - connected to Clarity to see his readings BG were consistently in the 200-300s on Tuesday, improved to 100-200s on Wednesday, and today have been consistently under 200, mostly 90-140 since this AM  Current meal patterns: Pt feels his BG are very elevated due to his eating habits and notes he feels his A1c will improve once he changes his diet. He has been watching what he is eating and is also considering restarting exercise  Pt does report he is feeling better since his sugars have been lower today.  Hyperlipidemia/ASCVD Risk Reduction  Current lipid lowering medications: fenofibrate 67 mg daily (just reduced due to renal function), atorvastatin 20 mg daily (started yesteday 3/12) Medications  tried in the past: has not taken statin in the past   The 10-year ASCVD risk score (Arnett DK, et al., 2019) is: 25.1%   Values used to calculate the score:     Age: 44 years     Sex: Male     Is Non-Hispanic African American: No     Diabetic: Yes     Tobacco smoker: No     Systolic Blood Pressure: 128 mmHg     Is BP treated: Yes     HDL Cholesterol: 32.1 mg/dL     Total Cholesterol: 234 mg/dL   Pt notes he has a history of very high triglycerides. Has also had ongoing abdominal pain that he has been evaluated for before but has not found any answers for cause of the pain. He notes he still has abdominal pain, it is slightly improved.  Objective:  Lab Results  Component Value Date   HGBA1C 13.2 04/13/2023    Lab Results  Component Value Date   CREATININE 2.01 (H) 04/14/2023   BUN 33 (H) 04/14/2023   NA 130 (L) 04/14/2023   K 4.4 04/14/2023   CL 94 (L) 04/14/2023   CO2 28 04/14/2023    Lab Results  Component Value Date   CHOL 234 (H) 12/29/2022   HDL 32.10 (L) 12/29/2022   LDLCALC 105 (H) 06/18/2020   LDLDIRECT 147.0 12/29/2022   TRIG (H) 12/29/2022    470.0 Triglyceride is over 400; calculations on Lipids are invalid.   CHOLHDL 7 12/29/2022    Medications Reviewed Today     Reviewed by  Bonita Quin, [place] (Pharmacist) on 04/27/23 at 1531  Med List Status: <None>   Medication Order Taking? Sig Documenting Provider Last Dose Status Informant  atorvastatin (LIPITOR) 40 MG tablet [id number] Yes Take 1 tablet (40 mg total) by mouth daily. Elenore Paddy, NP Taking Active   cetirizine (ZYRTEC) 5 MG tablet [id number]  Take 5 mg by mouth daily. [provider]  Active   Cholecalciferol (VITAMIN D3) 125 MCG (5000 UT) CAPS [id number]  Take 2 capsules by mouth daily. [provider]  Active            Med Note Carlos Levering, MINDY S   Wed Apr 29, 2020 11:06 AM)    clotrimazole (LOTRIMIN) 1 % cream [id number]  Apply 1 Application topically 2 (two) times  daily. Elenore Paddy, NP  Active   Continuous Glucose Sensor (DEXCOM G7 SENSOR) [place] [id number] Yes Apply sensor to skin of upper arm once every 10 days as instructed in manual. Make sure to change senor every 10 days. Elenore Paddy, NP Taking Active   Dulaglutide (TRULICITY) 3 MG/0.5ML [place] [id number] Yes Inject 3 mg as directed once a week. Elenore Paddy, NP Taking Active   fenofibrate micronized (LOFIBRA) 67 MG capsule [id number]  Take 1 capsule (67 mg total) by mouth daily before breakfast. Elenore Paddy, NP  Active   gabapentin (NEURONTIN) 300 MG capsule [id number]  Take 1 capsule (300 mg total) by mouth 3 (three) times daily. Elenore Paddy, NP  Active   HYDROcodone bit-homatropine [facility]) 5-1.5 MG/5ML syrup [id number]  Take 5 mLs by mouth at bedtime as needed for cough. Elenore Paddy, NP  Active   insulin degludec [facility]) 100 UNIT/ML FlexTouch Pen [id number] Yes Inject up to 50 units SQ daily. Start with 25 units daily, increase by 2 units ever 3 days until fasting blood sugars are averaging 130-150  Patient taking differently: 28 Units daily. Inject up to 50 units SQ daily. Start with 25 units daily, increase by 2 units ever 3 days until fasting blood sugars are averaging 130-150   Elenore Paddy, NP Taking Active   Insulin Pen Needle 31G X 5 MM MISC [id number]  1 each by Does not apply route daily. Elenore Paddy, NP  Active   metFORMIN (GLUCOPHAGE) 500 MG tablet [id number] Yes Take 1 tablet (500 mg total) by mouth 2 (two) times daily with a meal. Elenore Paddy, NP Taking Active   metoprolol succinate (TOPROL-XL) 50 MG 24 hr tablet [id number]  Take 1 tablet (50 mg total) by mouth daily. Elenore Paddy, NP  Active   Multiple Vitamin (MULTIVITAMIN WITH MINERALS) TABS tablet [id number]  Take 1 tablet by mouth daily before lunch. [provider]  Active Spouse/Significant Other  nystatin cream (MYCOSTATIN) [id number]  Apply 1 Application topically 2 (two) times daily. Elenore Paddy, NP   Active   olmesartan-hydrochlorothiazide [facility]) 40-12.5 MG tablet [id number]  TAKE ONE (1) TABLET BY MOUTH EVERY DAY Elenore Paddy, NP  Active   Quercetin 500 MG CAPS [id number]  Take by mouth. [provider]  Active   vitamin C (ASCORBIC ACID) 500 MG tablet [id number]  Take 500 mg by mouth daily before lunch. [provider]  Active Spouse/Significant Other  Zinc Sulfate (ZINC 15 PO) [id number]  Take by mouth daily. [provider]  Active               Assessment/Plan:   Diabetes: - Currently uncontrolled, A1c goal <  7% - Extensively reviewed long term cardiovascular and renal outcomes of uncontrolled blood sugar - Reviewed goal A1c, goal fasting, and goal 2 hour post prandial glucose - Reviewed dietary modifications including increasing protein, carb heavy foods in moderation and with balanced meal/snack - Reviewed lifestyle modifications including: increased activity/exercise as able - Renal function declined on last CMP w/ GFR 36. Monitor renal function for GFR <30 - Recommend to continue Tresiba 28 units daily, Trulicity 3 mg and metformin, consider Ozempic or Mounjaro in future if lipase remains stable  - Pt to come in 3/14 ot 3/17 for fasting labs to get repeat lipase and CMP  Hyperlipidemia/ASCVD Risk Reduction: - Currently uncontrolled. LDL goal <70 per ADA, TG goal <150 - Reviewed long term complications of uncontrolled cholesterol - Recommend to continue atorvastatin 40 mg daily, continue fenofibrate 67 mg daily   He will see PCP for follow up on 3/20  Follow Up Plan: 3/31   Arbutus Leas, PharmD, BCPS, CPP Clinical Pharmacist Practitioner [place] [facility] at [facility] [phone]

## 2023-04-28 DIAGNOSIS — N529 Male erectile dysfunction, unspecified: Secondary | ICD-10-CM | POA: Diagnosis not present

## 2023-05-01 ENCOUNTER — Other Ambulatory Visit (INDEPENDENT_AMBULATORY_CARE_PROVIDER_SITE_OTHER)

## 2023-05-01 DIAGNOSIS — R1011 Right upper quadrant pain: Secondary | ICD-10-CM

## 2023-05-01 DIAGNOSIS — E1165 Type 2 diabetes mellitus with hyperglycemia: Secondary | ICD-10-CM | POA: Diagnosis not present

## 2023-05-01 LAB — COMPREHENSIVE METABOLIC PANEL
ALT: 14 U/L (ref 0–53)
AST: 22 U/L (ref 0–37)
Albumin: 4.4 g/dL (ref 3.5–5.2)
Alkaline Phosphatase: 31 U/L — ABNORMAL LOW (ref 39–117)
BUN: 24 mg/dL — ABNORMAL HIGH (ref 6–23)
CO2: 30 meq/L (ref 19–32)
Calcium: 10 mg/dL (ref 8.4–10.5)
Chloride: 103 meq/L (ref 96–112)
Creatinine, Ser: 1.71 mg/dL — ABNORMAL HIGH (ref 0.40–1.50)
GFR: 43.6 mL/min — ABNORMAL LOW (ref >60.00)
Glucose, Bld: 159 mg/dL — ABNORMAL HIGH (ref 70–99)
Potassium: 4.1 meq/L (ref 3.5–5.1)
Sodium: 139 meq/L (ref 135–145)
Total Bilirubin: 0.8 mg/dL (ref 0.2–1.2)
Total Protein: 6.8 g/dL (ref 6.0–8.3)

## 2023-05-01 LAB — LIPASE: Lipase: 94 U/L — ABNORMAL HIGH (ref 11.0–59.0)

## 2023-05-04 ENCOUNTER — Ambulatory Visit: Payer: 59 | Admitting: Nurse Practitioner

## 2023-05-04 VITALS — BP 130/82 | HR 78 | Temp 98.3°F | Ht 73.0 in | Wt 225.5 lb

## 2023-05-04 DIAGNOSIS — E782 Mixed hyperlipidemia: Secondary | ICD-10-CM | POA: Diagnosis not present

## 2023-05-04 DIAGNOSIS — E559 Vitamin D deficiency, unspecified: Secondary | ICD-10-CM | POA: Diagnosis not present

## 2023-05-04 DIAGNOSIS — Z7984 Long term (current) use of oral hypoglycemic drugs: Secondary | ICD-10-CM

## 2023-05-04 DIAGNOSIS — Z7985 Long-term (current) use of injectable non-insulin antidiabetic drugs: Secondary | ICD-10-CM

## 2023-05-04 DIAGNOSIS — E1165 Type 2 diabetes mellitus with hyperglycemia: Secondary | ICD-10-CM

## 2023-05-04 MED ORDER — EMPAGLIFLOZIN 10 MG PO TABS: 10.0000 mg | ORAL_TABLET | Freq: Every day | ORAL | 3 refills | Status: DC

## 2023-05-04 NOTE — Assessment & Plan Note (Signed)
 Chronic Continue taking atorvastatin fenofibrate

## 2023-05-04 NOTE — Assessment & Plan Note (Signed)
 Plan on checking vitamin D level at next lab draw

## 2023-05-04 NOTE — Assessment & Plan Note (Signed)
 Chronic Improving Continue Trulicity 3 mg weekly, Tresiba 28 units daily, metformin 5 mg twice daily. Also continue olmesartan, atorvastatin, and fenofibrate Will add back Jardiance 10 mg daily Patient to return to clinic in about 6 weeks to recheck lipid panel we will also consider checking urine albumin creatinine ratio at that time as well.

## 2023-05-04 NOTE — Progress Notes (Signed)
 Established Patient Office Visit  Subjective   Patient ID: Karl Atkins, male    DOB: 02-05-1966  Age: 58 y.o. MRN: [id number]  Chief Complaint  Patient presents with   Diabetes    Type 2 diabetes: Last A1c quite elevated at 13.2.  Significant medication adjustments have been made and now patient is currently on Trulicity 3 mg weekly, Tresiba 20 units daily, metformin 500 mg twice daily.  He continues on his olmesartan as well as atorvastatin and fenofibrate for hypertriglyceridemia.  He is tolerating all medications well.  His 7-day readout on Dexcom identifies that blood sugars are in range 68% of the time and average blood sugar is 172 which is much improvement.  He was on Jardiance in the past with this was discontinued temporarily to hopefully prevent UTI or genital fungal infection.  He does have chronic kidney disease and follows with nephrology.  He also has chronic right upper quadrant/flank pain.  Etiology is yet to be identified.  Lipase has been checked as part of evaluation for this, it has been trending upward but he has not experienced any worsening pain.    ROS: see HPI    Objective:     BP 130/82   Pulse 78   Temp 98.3 F (36.8 C) (Temporal)   Ht 6\' 1"  (1.854 m)   Wt 225 lb 8 oz (102.3 kg)   SpO2 98%   BMI 29.75 kg/m    Physical Exam Vitals reviewed.  Constitutional:      Appearance: Normal appearance.  HENT:     Head: Normocephalic and atraumatic.  Cardiovascular:     Rate and Rhythm: Normal rate and regular rhythm.  Pulmonary:     Effort: Pulmonary effort is normal.     Breath sounds: Normal breath sounds.  Musculoskeletal:     Cervical back: Neck supple.  Skin:    General: Skin is warm and dry.  Neurological:     Mental Status: He is alert and oriented to person, place, and time.  Psychiatric:        Mood and Affect: Mood normal.        Behavior: Behavior normal.        Thought Content: Thought content normal.        Judgment: Judgment  normal.      No results found for any visits on 05/04/23.    The 10-year ASCVD risk score (Arnett DK, et al., 2019) is: 25.7%    Assessment & Plan:   Problem List Items Addressed This Visit       Endocrine   Type 2 diabetes mellitus with hyperglycemia (HCC) - Primary   Chronic Improving Continue Trulicity 3 mg weekly, Tresiba 28 units daily, metformin 5 mg twice daily. Also continue olmesartan, atorvastatin, and fenofibrate Will add back Jardiance 10 mg daily Patient to return to clinic in about 6 weeks to recheck lipid panel we will also consider checking urine albumin creatinine ratio at that time as well.      Relevant Medications   empagliflozin (JARDIANCE) 10 MG TABS tablet   Other Relevant Orders   Microalbumin / creatinine urine ratio   Lipid panel   Comprehensive metabolic panel     Other   Hyperlipidemia   Chronic Continue taking atorvastatin fenofibrate      Relevant Orders   Microalbumin / creatinine urine ratio   Lipid panel   Comprehensive metabolic panel   Vitamin D deficiency disease   Plan on checking vitamin D level  at next lab draw      Relevant Orders   VITAMIN D 25 Hydroxy (Vit-D Deficiency, Fractures)    Return in about 6 weeks (around 06/15/2023) for F/U with Dink Creps.    Elenore Paddy, NP

## 2023-05-12 ENCOUNTER — Ambulatory Visit: Payer: 59 | Admitting: Nurse Practitioner

## 2023-05-15 ENCOUNTER — Other Ambulatory Visit

## 2023-05-18 ENCOUNTER — Ambulatory Visit: Payer: 59 | Admitting: Nurse Practitioner

## 2023-05-25 ENCOUNTER — Other Ambulatory Visit (INDEPENDENT_AMBULATORY_CARE_PROVIDER_SITE_OTHER): Admitting: Pharmacist

## 2023-05-25 DIAGNOSIS — E1165 Type 2 diabetes mellitus with hyperglycemia: Secondary | ICD-10-CM

## 2023-05-25 DIAGNOSIS — E782 Mixed hyperlipidemia: Secondary | ICD-10-CM

## 2023-05-25 NOTE — Patient Instructions (Signed)
 It was a pleasure speaking with you today!  Continue your current medication regimen and keep monitoring blood sugars.  Feel free to call with any questions or concerns!  Arbutus Leas, PharmD, BCPS, CPP Clinical Pharmacist Practitioner [place] [facility] at [facility] [phone]

## 2023-05-25 NOTE — Progress Notes (Signed)
 05/25/2023 Name: Karl Atkins MRN: [id number] DOB: 1965-03-25  Chief Complaint  Patient presents with   Diabetes   Medication Management    LANDIS DOWDY is a 58 y.o. year old male who presented for a telephone visit.   They were referred to the pharmacist by their PCP for assistance in managing diabetes.   Subjective:  Care Team: Primary Care Provider: Elenore Paddy, NP ; Next Scheduled Visit: 5/1  Medication Access/Adherence  Current Pharmacy:  [facility] - [place], [place] - [address] [place] [address] Phone: [phone] Fax: [phone]  [facility] [id number] - [place], [place] - [address] AT [facility] [address] [place] [address] Phone: [phone] Fax: [phone]   Patient reports affordability concerns with their medications: No  Patient reports access/transportation concerns to their pharmacy: Yes  Patient reports adherence concerns with their medications:  Yes     Diabetes:  Current medications: Trulicity 3 mg daily (just started increased dose last week), metformin 500 mg BID, Tresiba 28 units daily (started 04/24/23), Jardiance 10 mg daily (restarted 3/20)  Current glucose readings:  Just got Dexcom G7 set up on Tuesday 3/10 - connected to [facility] to see his readings   Current meal patterns: Pt has made diet changes - reduced sweets and high carb foods such as ramen noodles  Pt does report he is feeling better since his sugars have been lower   Hyperlipidemia/ASCVD Risk Reduction  Current lipid lowering medications: fenofibrate 67 mg daily (just reduced due to renal function), atorvastatin 20 mg daily (started 3/12) Medications tried in the past: has not taken statin in the past   The 10-year ASCVD risk score (Arnett DK, et al., 2019) is: 25.7%   Values used to calculate the score:     Age: 60 years     Sex: Male     Is Non-Hispanic African American: No     Diabetic:  Yes     Tobacco smoker: No     Systolic Blood Pressure: 130 mmHg     Is BP treated: Yes     HDL Cholesterol: 32.1 mg/dL     Total Cholesterol: 234 mg/dL   Pt notes he has a history of very high triglycerides. Has also had ongoing abdominal pain that he has been evaluated for before but has not found any answers for cause of the pain. He notes he still has abdominal pain, it is slightly improved.  Objective:  Lab Results  Component Value Date   HGBA1C 13.2 04/13/2023    Lab Results  Component Value Date   CREATININE 1.71 (H) 05/01/2023   BUN 24 (H) 05/01/2023   NA 139 05/01/2023   K 4.1 05/01/2023   CL 103 05/01/2023   CO2 30 05/01/2023    Lab Results  Component Value Date   CHOL 234 (H) 12/29/2022   HDL 32.10 (L) 12/29/2022   LDLCALC 105 (H) 06/18/2020   LDLDIRECT 147.0 12/29/2022   TRIG (H) 12/29/2022    470.0 Triglyceride is over 400; calculations on Lipids are invalid.   CHOLHDL 7 12/29/2022    Medications Reviewed Today   Medications were not reviewed in this encounter       Assessment/Plan:   Diabetes: - Currently uncontrolled, A1c goal <7% - much improved per Dexcom data - Extensively reviewed long term cardiovascular and renal outcomes of uncontrolled blood sugar - Reviewed goal A1c, goal fasting,  and goal 2 hour post prandial glucose - Reviewed dietary modifications including increasing protein, carb heavy foods in moderation and with balanced meal/snack - Reviewed lifestyle modifications including: increased activity/exercise as able - Renal function improved on last check in March, monitor for GFR <30 - Recommend to continue Tresiba 28 units daily, Trulicity 3 mg, Jardiance 10 mg and metformin, can consider Ozempic or Mounjaro in future if lipase remains stable  - Pt to come prior to 5/1 appt for updated labs  Hyperlipidemia/ASCVD Risk Reduction: - Currently uncontrolled. LDL goal <70 per ADA, TG goal <150 - Reviewed long term complications of  uncontrolled cholesterol - Recommend to continue atorvastatin 40 mg daily, continue fenofibrate 67 mg daily   He will see PCP for follow up on 5/1  Follow Up Plan: await PCP appt to determine appropriate follow up   Arbutus Leas, PharmD, BCPS, CPP Clinical Pharmacist Practitioner [facility] [phone]

## 2023-06-13 ENCOUNTER — Other Ambulatory Visit (INDEPENDENT_AMBULATORY_CARE_PROVIDER_SITE_OTHER)

## 2023-06-13 DIAGNOSIS — E1165 Type 2 diabetes mellitus with hyperglycemia: Secondary | ICD-10-CM

## 2023-06-13 DIAGNOSIS — E559 Vitamin D deficiency, unspecified: Secondary | ICD-10-CM | POA: Diagnosis not present

## 2023-06-13 DIAGNOSIS — E782 Mixed hyperlipidemia: Secondary | ICD-10-CM

## 2023-06-13 LAB — MICROALBUMIN / CREATININE URINE RATIO
Creatinine,U: 159.4 mg/dL
Microalb Creat Ratio: 160.4 mg/g — ABNORMAL HIGH (ref 0.0–30.0)
Microalb, Ur: 25.6 mg/dL — ABNORMAL HIGH (ref 0.0–1.9)

## 2023-06-13 LAB — COMPREHENSIVE METABOLIC PANEL WITH GFR
ALT: 14 U/L (ref 0–53)
AST: 24 U/L (ref 0–37)
Albumin: 4.3 g/dL (ref 3.5–5.2)
Alkaline Phosphatase: 26 U/L — ABNORMAL LOW (ref 39–117)
BUN: 25 mg/dL — ABNORMAL HIGH (ref 6–23)
CO2: 29 meq/L (ref 19–32)
Calcium: 9.7 mg/dL (ref 8.4–10.5)
Chloride: 104 meq/L (ref 96–112)
Creatinine, Ser: 1.8 mg/dL — ABNORMAL HIGH (ref 0.40–1.50)
GFR: 40.97 mL/min — ABNORMAL LOW (ref >60.00)
Glucose, Bld: 92 mg/dL (ref 70–99)
Potassium: 3.9 meq/L (ref 3.5–5.1)
Sodium: 139 meq/L (ref 135–145)
Total Bilirubin: 0.8 mg/dL (ref 0.2–1.2)
Total Protein: 6.8 g/dL (ref 6.0–8.3)

## 2023-06-13 LAB — LIPID PANEL
Cholesterol: 117 mg/dL (ref 0–200)
HDL: 38.4 mg/dL — ABNORMAL LOW (ref >39.00)
LDL Cholesterol: 53 mg/dL (ref 0–99)
NonHDL: 78.68
Total CHOL/HDL Ratio: 3
Triglycerides: 129 mg/dL (ref 0.0–149.0)
VLDL: 25.8 mg/dL (ref 0.0–40.0)

## 2023-06-13 LAB — VITAMIN D 25 HYDROXY (VIT D DEFICIENCY, FRACTURES): VITD: 38.36 ng/mL (ref 30.00–100.00)

## 2023-06-15 ENCOUNTER — Ambulatory Visit: Admitting: Nurse Practitioner

## 2023-06-16 ENCOUNTER — Ambulatory Visit: Admitting: Nurse Practitioner

## 2023-06-16 VITALS — BP 140/86 | HR 85 | Temp 97.7°F | Ht 73.0 in | Wt 222.4 lb

## 2023-06-16 DIAGNOSIS — E1165 Type 2 diabetes mellitus with hyperglycemia: Secondary | ICD-10-CM

## 2023-06-16 DIAGNOSIS — L089 Local infection of the skin and subcutaneous tissue, unspecified: Secondary | ICD-10-CM

## 2023-06-16 DIAGNOSIS — T148XXA Other injury of unspecified body region, initial encounter: Secondary | ICD-10-CM | POA: Diagnosis not present

## 2023-06-16 DIAGNOSIS — Z7985 Long-term (current) use of injectable non-insulin antidiabetic drugs: Secondary | ICD-10-CM | POA: Diagnosis not present

## 2023-06-16 DIAGNOSIS — Z7984 Long term (current) use of oral hypoglycemic drugs: Secondary | ICD-10-CM

## 2023-06-16 DIAGNOSIS — S8411XS Injury of peroneal nerve at lower leg level, right leg, sequela: Secondary | ICD-10-CM

## 2023-06-16 MED ORDER — MUPIROCIN 2 % EX OINT: 1.0000 | TOPICAL_OINTMENT | Freq: Two times a day (BID) | CUTANEOUS | 0 refills | Status: AC

## 2023-06-16 MED ORDER — DOXYCYCLINE HYCLATE 100 MG PO TABS: 100.0000 mg | ORAL_TABLET | Freq: Two times a day (BID) | ORAL | 0 refills | Status: DC

## 2023-06-16 MED ORDER — EMPAGLIFLOZIN 25 MG PO TABS: 25.0000 mg | ORAL_TABLET | Freq: Every day | ORAL | 1 refills | Status: DC

## 2023-06-16 NOTE — Progress Notes (Signed)
 Established Patient Office Visit  Subjective   Patient ID: Karl Atkins, male    DOB: 07-05-1965  Age: 58 y.o. MRN: [id number]  Chief Complaint  Patient presents with   Diabetes    Type 2 diabetes: Based on CGM data expected next A1c is to be 7.2 which is much improvement from 13.2 when it was last checked 04/13/23.  He is currently on Trulicity 3 mg/week, Jardiance  10 mg/day, metformin  500 mg twice daily.  He does have associated CKD stage IIIb as well as hyperlipidemia.  He continues on atorvastatin  40 mg daily and fenofibrate  67 mg daily.  Last LDL 53, last triglycerides 129.  He follows with nephrology.  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  Skin wounds: Reports he was working out in his yard and then the next day woke up with multiple scabs on legs and arms.  On his right arm he has to open lesions are no longer draining but were draining pus and are slightly swollen.  He has been applying Polysporin to this.  He also has disability paperwork for me to complete.  He reports he was originally placed on disability by neurosurgeon Dr. Joanette Moynahan in 2013 after he underwent surgery for treatment of lumbar spondylosis.  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  He no longer follows with neurosurgery for management of this as everything has been stable for multiple years.    ROS: see HPI    Objective:     BP (!) 140/86   Pulse 85   Temp 97.7 F (36.5 C) (Temporal)   Ht 6\' 1"  (1.854 m)   Wt 222 lb 6 oz (100.9 kg)   SpO2 99%   BMI 29.34 kg/m    Physical Exam Vitals reviewed.  Constitutional:      Appearance: Normal appearance.  HENT:     Head: Normocephalic and atraumatic.  Cardiovascular:     Rate and Rhythm: Normal rate and regular rhythm.  Pulmonary:     Effort: Pulmonary effort is normal.     Breath sounds: Normal breath  sounds.  Musculoskeletal:     Cervical back: Neck supple.  Skin:    General: Skin is warm and dry.     Comments: Multiple scattered scabs noted on bilateral lower extremities as well as left upper extremity.  Right upper extremity does have 2 ulcerations that are not draining, they are slightly red and edematous.  Neurological:     Mental Status: He is alert and oriented to person, place, and time.  Psychiatric:        Mood and Affect: Mood normal.        Behavior: Behavior normal.        Thought Content: Thought content normal.        Judgment: Judgment normal.      No results found for any visits on 06/16/23.    The ASCVD Risk score (Arnett DK, et al., 2019) failed to calculate for the following reasons:   The valid total cholesterol range is 130 to 320 mg/dL    Assessment & Plan:   Problem List Items Addressed This Visit       Endocrine   Type 2 diabetes mellitus with hyperglycemia (HCC)   Chronic Blood sugar readings much improved over the last few months.  Still a bit too early to recheck A1c. Will  increase Jardiance  to 25 mg daily, continue Trulicity 3 mg/week, fenofibrate  67 mg daily, atorvastatin  40 mg daily, metformin  500 mg twice daily, olmesartan -hydrochlorothiazide  40-12.5 mg daily.  Will need to discuss foot exam, eye exam, pneumonia vaccine at next office visit.  Will also plan on checking A1c at next office visit as well.      Relevant Medications   empagliflozin  (JARDIANCE ) 25 MG TABS tablet     Nervous and Auditory   Peroneal nerve injury   Chronic Appears to have occurred in 2013 after a fall. I believe I have requested medical records from Dr. Joanette Moynahan in the past and have not yet received them.  I do see some records from 2013 where patient went to ER after the fall.  And then again in 2018 via Dr. Joanette Moynahan who does mention the nerve injury and difficulty with dorsiflexion. Patient is not currently following with neurosurgery or myself specifically for this  problem at this time as symptoms have remained chronic and stable. I refilled out his paperwork and we will again request medical records so that we can have he is on file regarding his disability.        Other   Infected wound - Primary   Acute Has 2 wounds to right upper extremity.  Treat with doxycycline  and topical mupirocin . Patient to return to clinic if symptoms persist or worsen.      Relevant Medications   doxycycline  (VIBRA -TABS) 100 MG tablet   mupirocin  ointment (BACTROBAN ) 2 %    Return in about 5 months (around 11/16/2023) for F/U with Isa Manuel.  Total time spent on encounter today was 32 minutes including face-to-face evaluation, review of previous medical records, and development/discussion of treatment plan.   Zorita Hiss, NP

## 2023-06-16 NOTE — Assessment & Plan Note (Signed)
 Chronic Blood sugar readings much improved over the last few months.  Still a bit too early to recheck A1c. Will increase Jardiance  to 25 mg daily, continue Trulicity 3 mg/week, fenofibrate  67 mg daily, atorvastatin  40 mg daily, metformin  500 mg twice daily, olmesartan -hydrochlorothiazide  40-12.5 mg daily.  Will need to discuss foot exam, eye exam, pneumonia vaccine at next office visit.  Will also plan on checking A1c at next office visit as well.

## 2023-06-16 NOTE — Assessment & Plan Note (Signed)
 Acute Has 2 wounds to right upper extremity.  Treat with doxycycline  and topical mupirocin . Patient to return to clinic if symptoms persist or worsen.

## 2023-06-16 NOTE — Assessment & Plan Note (Signed)
 Chronic Appears to have occurred in 2013 after a fall. I believe I have requested medical records from Dr. Joanette Moynahan in the past and have not yet received them.  I do see some records from 2013 where patient went to ER after the fall.  And then again in 2018 via Dr. Joanette Moynahan who does mention the nerve injury and difficulty with dorsiflexion. Patient is not currently following with neurosurgery or myself specifically for this problem at this time as symptoms have remained chronic and stable. I refilled out his paperwork and we will again request medical records so that we can have he is on file regarding his disability.

## 2023-06-19 ENCOUNTER — Telehealth: Payer: Self-pay | Admitting: Pharmacy Technician

## 2023-06-19 ENCOUNTER — Other Ambulatory Visit (HOSPITAL_COMMUNITY): Payer: Self-pay

## 2023-06-19 NOTE — Telephone Encounter (Signed)
 Pharmacy Patient Advocate Encounter   Received notification from CoverMyMeds that prior authorization for Jardiance  25MG  tablets is required/requested.   Insurance verification completed.   The patient is insured through [facility]   Per test claim: Refill too soon. PA is not needed at this time. Medication was filled 06/16/2023. Next eligible fill date is 07/10/2023.

## 2023-06-21 ENCOUNTER — Other Ambulatory Visit: Payer: Self-pay | Admitting: Nurse Practitioner

## 2023-06-21 DIAGNOSIS — E1165 Type 2 diabetes mellitus with hyperglycemia: Secondary | ICD-10-CM

## 2023-07-05 ENCOUNTER — Encounter: Payer: Self-pay | Admitting: Nurse Practitioner

## 2023-07-07 ENCOUNTER — Other Ambulatory Visit: Payer: Self-pay | Admitting: Nurse Practitioner

## 2023-07-07 DIAGNOSIS — L089 Local infection of the skin and subcutaneous tissue, unspecified: Secondary | ICD-10-CM

## 2023-07-07 MED ORDER — DOXYCYCLINE HYCLATE 100 MG PO TABS: 100.0000 mg | ORAL_TABLET | Freq: Two times a day (BID) | ORAL | 0 refills | Status: DC

## 2023-08-23 ENCOUNTER — Telehealth: Payer: Self-pay | Admitting: Pharmacy Technician

## 2023-08-23 NOTE — Progress Notes (Signed)
   08/23/2023  Patient ID: Karl Atkins [place], male   DOB: Nov 02, 1965, 58 y.o.   MRN: [id number]  Patient engaged with clinical pharmacist for management of diabetes on 05/25/23. Outreach by [employer] technician was requested.   Outreached patient to discuss diabetes medication management. Left voicemail for patient to return my call at their convenience.   Earlyn Sylvan, CPhT [facility] Office: [phone] Email: [email or web address]

## 2023-08-24 ENCOUNTER — Other Ambulatory Visit: Payer: Self-pay | Admitting: Nurse Practitioner

## 2023-08-24 DIAGNOSIS — E1165 Type 2 diabetes mellitus with hyperglycemia: Secondary | ICD-10-CM

## 2023-08-28 ENCOUNTER — Telehealth: Payer: Self-pay | Admitting: Pharmacy Technician

## 2023-08-28 NOTE — Progress Notes (Addendum)
   08/28/2023 Name: Karl Atkins MRN: [id number] DOB: [birth year]  Patient is appearing on a report for [facility] [place] Metric Diabetes and last engaged with the clinical pharmacist to discuss diabetes on 05/25/2023. Contacted patient today to discuss diabetes management and completed medication review.   Diabetes Plan from last clinical pharmacist appointment:  Diabetes: - Currently uncontrolled, A1c goal <7% - much improved per Dexcom data - Extensively reviewed long term cardiovascular and renal outcomes of uncontrolled blood sugar - Reviewed goal A1c, goal fasting, and goal 2 hour post prandial glucose - Reviewed dietary modifications including increasing protein, carb heavy foods in moderation and with balanced meal/snack - Reviewed lifestyle modifications including: increased activity/exercise as able - Renal function improved on last check in March, monitor for GFR <30 - Recommend to continue Tresiba  28 units daily, Trulicity  3 mg, Jardiance  10 mg and metformin , can consider Ozempic or Mounjaro in future if lipase remains stable - Pt to come prior to 5/1 appt for updated labs(copy/paste from last note)   Medication Adherence Barriers Identified:  Patient made recommended medication changes per plan: Yes Patient has had a follow up with PCP since last PharmD visit. Patient  informs he is taking Tresiba28 units daily, Trulicity  3mg  weekly, Jardiance  25mg  daily and Metformin  500mg  twice a day. Access issues with any new medication or testing device: No Per Dr Annemarie, he last filled a 30 day supply of Tresiba  on 7/12, Trulicity  on 7/10, Dexcom G7 Sensors on 7/11, Jardiance  and Metformin  on 08/23/23. No cost concerns report  Patient is checking blood sugars as prescribed: Yes He uses Dexcom CGM to check blood sugars. He currently reports a blood sugar of 138. He informs in range 84%.  Patient informs he still has a brown spot at the tick bite site. He informs it is no longer red or draining but is not  back to a normal appearance prior to the bite. He inquires if he needs another round of antibiotics. Will send in basket to PharmD . PharmD advised patient to schedule an appointment about tick bite. Called patient and informed him of this information. He will either call office or send a mychart meessage.  Medication Adherence Barriers Addressed/Actions Taken:  Reviewed medication changes per plan from last clinical pharmacist note Educated patient to contact pharmacy regarding new prescriptions and refills.  Reviewed instructions for monitoring blood sugars at home and reminded patient to keep a written log to review with pharmacist Reminded patient of date/time of upcoming clinical pharmacist follow up and any upcoming PCP/specialists visits. Patient denies transportation barriers to the appointment. Yes  Next clinical pharmacist appointment is scheduled for: TBD  Kate Caddy, CPhT [facility] Office: [phone] Email: [email or web address]

## 2023-09-11 ENCOUNTER — Telehealth: Payer: Self-pay | Admitting: *Deleted

## 2023-09-11 NOTE — Progress Notes (Signed)
 Complex Care Management Care Guide Note  09/11/2023 Name: Karl Atkins MRN: [id number] DOB: [birth year]  Karl Atkins is a 58 y.o. year old male who is a primary care patient of Elnor Lauraine BRAVO, NP and is actively engaged with the care management team. I reached out to [place] LITTIE Somerset by phone today to assist with re-scheduling  with the Pharmacist.  Follow up plan: Unsuccessful telephone outreach attempt made. A HIPAA compliant phone message was left for the patient providing contact information and requesting a return call. No further outreach attempts will be made due to inability to maintain patient contact.   Thedford Franks, CMA [place]  [facility] Guide Direct Dial: [phone]  Fax: [phone] Website: [email or web address]

## 2023-09-28 ENCOUNTER — Other Ambulatory Visit: Payer: Self-pay | Admitting: Nurse Practitioner

## 2023-09-28 DIAGNOSIS — E1165 Type 2 diabetes mellitus with hyperglycemia: Secondary | ICD-10-CM

## 2023-09-28 DIAGNOSIS — E119 Type 2 diabetes mellitus without complications: Secondary | ICD-10-CM

## 2023-09-28 DIAGNOSIS — I10 Essential (primary) hypertension: Secondary | ICD-10-CM

## 2023-09-30 ENCOUNTER — Telehealth: Admitting: Physician Assistant

## 2023-09-30 DIAGNOSIS — L282 Other prurigo: Secondary | ICD-10-CM | POA: Diagnosis not present

## 2023-09-30 MED ORDER — HYDROXYZINE PAMOATE 25 MG PO CAPS: 25.0000 mg | ORAL_CAPSULE | Freq: Three times a day (TID) | ORAL | 0 refills | Status: AC | PRN

## 2023-09-30 NOTE — Progress Notes (Signed)
 Virtual Visit Consent   Karl Atkins, you are scheduled for a virtual visit with a [facility] provider today. Just as with appointments in the office, your consent must be obtained to participate. Your consent will be active for this visit and any virtual visit you may have with one of our providers in the next 365 days. If you have a MyChart account, a copy of this consent can be sent to you electronically.  As this is a virtual visit, video technology does not allow for your provider to perform a traditional examination. This may limit your provider's ability to fully assess your condition. If your provider identifies any concerns that need to be evaluated in person or the need to arrange testing (such as labs, EKG, etc.), we will make arrangements to do so. Although advances in technology are sophisticated, we cannot ensure that it will always work on either your end or our end. If the connection with a video visit is poor, the visit may have to be switched to a telephone visit. With either a video or telephone visit, we are not always able to ensure that we have a secure connection.  By engaging in this virtual visit, you consent to the provision of healthcare and authorize for your insurance to be billed (if applicable) for the services provided during this visit. Depending on your insurance coverage, you may receive a charge related to this service.  I need to obtain your verbal consent now. Are you willing to proceed with your visit today? Karl Atkins has provided verbal consent on 09/30/2023 for a virtual visit (video or telephone). Karl Atkins, [place]  Date: 09/30/2023 11:51 AM   Virtual Visit via Video Note   I, Karl Atkins, connected with  Karl Atkins  ([id number], [birth year]) on 09/30/23 at 11:30 AM EDT by a video-enabled telemedicine application and verified that I am speaking with the correct person using two identifiers.  Location: Patient: Virtual Visit Location  Patient: Home Provider: Virtual Visit Location Provider: Home Office   I discussed the limitations of evaluation and management by telemedicine and the availability of in person appointments. The patient expressed understanding and agreed to proceed.    History of Present Illness: Karl Atkins is a 58 y.o. who identifies as a male who was assigned male at birth, and is being seen today for continued rash of lower back, wasit line and bilateral sides over the past few weeks that is persistent and now with some areas on proximal legs. Was initially evaluated by his PCP and started on course of Doxycycline , being referred to Dermatology. Notes seeing Dermatologist 2 days ago who started him on topical mupirocin  and triamcinolone , but so far without improvement. Denies fever, chills. Denies change to soaps, lotions or detergents. Denies travel or known sick contact. HPI: HPI  Problems:  Patient Active Problem List   Diagnosis Date Noted   Upper respiratory tract infection 02/09/2023   CKD stage 3b, GFR 30-44 ml/min (HCC) 12/29/2022   RUQ pain 05/03/2022   Infected wound 02/03/2022   Vitamin D  deficiency disease 11/18/2021   Displacement of lumbar intervertebral disc 06/18/2021   Other specified joint disorders, left hip 06/18/2021   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   Kidney dysfunction 06/18/2021   Chest wall pain 05/28/2021   Tinea cruris 05/06/2021   Rash 05/06/2021   Biliary dyskinesia 04/29/2020   Chronic cholecystitis    Abdominal pain 02/27/2020   History of colonic polyps 02/27/2020  Diabetic neuropathy (HCC) 05/16/2016   Hypertension    Hyperlipidemia    Type 2 diabetes mellitus with hyperglycemia (HCC) 04/09/2010   OBESITY 04/09/2010    Allergies:  Allergies  Allergen Reactions   Bee Pollen Other (See Comments)    Itchy eyes/runny nose   Pollen Extract-Tree Extract Other (See Comments)    Itchy eyes/runny nose   Medications:  Current Outpatient Medications:     hydrOXYzine  (VISTARIL ) 25 MG capsule, Take 1 capsule (25 mg total) by mouth every 8 (eight) hours as needed., Disp: 30 capsule, Rfl: 0   atorvastatin  (LIPITOR) 40 MG tablet, Take 1 tablet (40 mg total) by mouth daily., Disp: 90 tablet, Rfl: 3   cetirizine (ZYRTEC) 5 MG tablet, Take 5 mg by mouth daily., Disp: , Rfl:    Cholecalciferol (VITAMIN D3) 125 MCG (5000 UT) CAPS, Take 2 capsules by mouth daily., Disp: , Rfl:    clotrimazole  (LOTRIMIN ) 1 % cream, Apply 1 Application topically 2 (two) times daily., Disp: 30 g, Rfl: 0   Continuous Glucose Sensor (DEXCOM G7 SENSOR) MISC, APPLY SENSOR TO SKIN OF UPPER ARM ONCE EVERY 10 DAYS AS INSTRUCTED IN MANUAL. MAKE SURE TO CHANGE SENSOR EVERY 10 DAYS., Disp: 3 each, Rfl: 3   doxycycline  (VIBRA -TABS) 100 MG tablet, Take 1 tablet (100 mg total) by mouth 2 (two) times daily., Disp: 20 tablet, Rfl: 0   empagliflozin  (JARDIANCE ) 25 MG TABS tablet, Take 1 tablet (25 mg total) by mouth daily before breakfast., Disp: 90 tablet, Rfl: 1   fenofibrate  micronized (LOFIBRA) 67 MG capsule, Take 1 capsule (67 mg total) by mouth daily before breakfast., Disp: 90 capsule, Rfl: 1   gabapentin  (NEURONTIN ) 300 MG capsule, Take 1 capsule (300 mg total) by mouth 3 (three) times daily., Disp: 90 capsule, Rfl: 3   HYDROcodone  bit-homatropine (HYCODAN) 5-1.5 MG/5ML syrup, Take 5 mLs by mouth at bedtime as needed for cough., Disp: 120 mL, Rfl: 0   insulin  degludec (TRESIBA  FLEXTOUCH) 100 UNIT/ML FlexTouch Pen, INJECT UP TO 50 UNITS UNDER THE SKIN DAILY. START WITH 25 UNITS DAILY, INCREASE BY 2 UNITS EVERY 3 DAYS UNTIL FASTING BLOOD SUGARS AVERAGING 130-150, Disp: 15 mL, Rfl: 1   Insulin  Pen Needle 31G X 5 MM MISC, 1 each by Does not apply route daily., Disp: 90 each, Rfl: 1   metFORMIN  (GLUCOPHAGE ) 500 MG tablet, TAKE ONE TABLET (500 MG TOTAL) BY MOUTH TWO TIMES DAILY WITH A MEAL., Disp: 180 tablet, Rfl: 1   metoprolol  succinate (TOPROL -XL) 50 MG 24 hr tablet, Take 1 tablet (50 mg  total) by mouth daily., Disp: 90 tablet, Rfl: 1   Multiple Vitamin (MULTIVITAMIN WITH MINERALS) TABS tablet, Take 1 tablet by mouth daily before lunch., Disp: , Rfl:    mupirocin  ointment (BACTROBAN ) 2 %, Apply 1 Application topically 2 (two) times daily., Disp: 22 g, Rfl: 0   olmesartan -hydrochlorothiazide  (BENICAR  HCT) 40-12.5 MG tablet, TAKE ONE (1) TABLET BY MOUTH EVERY DAY, Disp: 90 tablet, Rfl: 1   Quercetin 500 MG CAPS, Take by mouth., Disp: , Rfl:    TRULICITY  3 MG/0.5ML SOAJ, INJECT 3 MG AS DIRECTED ONCE A WEEK, Disp: 2 mL, Rfl: 3   vitamin C (ASCORBIC ACID) 500 MG tablet, Take 500 mg by mouth daily before lunch., Disp: , Rfl:    Zinc Sulfate (ZINC 15 PO), Take by mouth daily., Disp: , Rfl:   Observations/Objective: Patient is well-developed, well-nourished in no acute distress.  Resting comfortably  at home.  Head is normocephalic, atraumatic.  No labored breathing.  Speech is clear and coherent with logical content.  Patient is alert and oriented at baseline.  Very hard to properly visualize the rash due to bright lighting (patient outside on deck) and due to poor video quality from patient camera. Some areas of erythema and excoriation noted around the waistline. Papular rash in the areas it can be clearly visualized. Areas of scabbing, likely from prior excoriation.   Assessment and Plan: 1. Pruritic rash (Primary) - hydrOXYzine  (VISTARIL ) 25 MG capsule; Take 1 capsule (25 mg total) by mouth every 8 (eight) hours as needed.  Dispense: 30 capsule; Refill: 0  Unclear etiology. Is already being managed by a specialist. For now, will add on hydroxyzine  to help with itch and prevent further excoriation. Continue care by Dermatologist, calling them first thing Monday morning for re-evaluation.  Follow Up Instructions: I discussed the assessment and treatment plan with the patient. The patient was provided an opportunity to ask questions and all were answered. The patient agreed with  the plan and demonstrated an understanding of the instructions.  A copy of instructions were sent to the patient via MyChart unless otherwise noted below.   The patient was advised to call back or seek an in-person evaluation if the symptoms worsen or if the condition fails to improve as anticipated.    Karl Velma Lunger, PA-C

## 2023-09-30 NOTE — Patient Instructions (Signed)
 Karl Atkins, thank you for joining Elsie Velma Lunger, PA-C for today's virtual visit.  While this provider is not your primary care provider (PCP), if your PCP is located in our provider database this encounter information will be shared with them immediately following your visit.   A [facility] MyChart account gives you access to today's visit and all your visits, tests, and labs performed at [facility]  click here if you don't have a  MyChart account or go to [email or web address]  Consent: (Patient) Karl Atkins provided verbal consent for this virtual visit at the beginning of the encounter.  Current Medications:  Current Outpatient Medications:    hydrOXYzine  (VISTARIL ) 25 MG capsule, Take 1 capsule (25 mg total) by mouth every 8 (eight) hours as needed., Disp: 30 capsule, Rfl: 0   atorvastatin  (LIPITOR) 40 MG tablet, Take 1 tablet (40 mg total) by mouth daily., Disp: 90 tablet, Rfl: 3   cetirizine (ZYRTEC) 5 MG tablet, Take 5 mg by mouth daily., Disp: , Rfl:    Cholecalciferol (VITAMIN D3) 125 MCG (5000 UT) CAPS, Take 2 capsules by mouth daily., Disp: , Rfl:    clotrimazole  (LOTRIMIN ) 1 % cream, Apply 1 Application topically 2 (two) times daily., Disp: 30 g, Rfl: 0   Continuous Glucose Sensor (DEXCOM G7 SENSOR) MISC, APPLY SENSOR TO SKIN OF UPPER ARM ONCE EVERY 10 DAYS AS INSTRUCTED IN MANUAL. MAKE SURE TO CHANGE SENSOR EVERY 10 DAYS., Disp: 3 each, Rfl: 3   doxycycline  (VIBRA -TABS) 100 MG tablet, Take 1 tablet (100 mg total) by mouth 2 (two) times daily., Disp: 20 tablet, Rfl: 0   empagliflozin  (JARDIANCE ) 25 MG TABS tablet, Take 1 tablet (25 mg total) by mouth daily before breakfast., Disp: 90 tablet, Rfl: 1   fenofibrate  micronized (LOFIBRA) 67 MG capsule, Take 1 capsule (67 mg total) by mouth daily before breakfast., Disp: 90 capsule, Rfl: 1   gabapentin  (NEURONTIN ) 300 MG capsule, Take 1 capsule (300 mg total) by mouth 3 (three) times daily., Disp: 90  capsule, Rfl: 3   HYDROcodone  bit-homatropine (HYCODAN) 5-1.5 MG/5ML syrup, Take 5 mLs by mouth at bedtime as needed for cough., Disp: 120 mL, Rfl: 0   insulin  degludec (TRESIBA  FLEXTOUCH) 100 UNIT/ML FlexTouch Pen, INJECT UP TO 50 UNITS UNDER THE SKIN DAILY. START WITH 25 UNITS DAILY, INCREASE BY 2 UNITS EVERY 3 DAYS UNTIL FASTING BLOOD SUGARS AVERAGING 130-150, Disp: 15 mL, Rfl: 1   Insulin  Pen Needle 31G X 5 MM MISC, 1 each by Does not apply route daily., Disp: 90 each, Rfl: 1   metFORMIN  (GLUCOPHAGE ) 500 MG tablet, TAKE ONE TABLET (500 MG TOTAL) BY MOUTH TWO TIMES DAILY WITH A MEAL., Disp: 180 tablet, Rfl: 1   metoprolol  succinate (TOPROL -XL) 50 MG 24 hr tablet, Take 1 tablet (50 mg total) by mouth daily., Disp: 90 tablet, Rfl: 1   Multiple Vitamin (MULTIVITAMIN WITH MINERALS) TABS tablet, Take 1 tablet by mouth daily before lunch., Disp: , Rfl:    mupirocin  ointment (BACTROBAN ) 2 %, Apply 1 Application topically 2 (two) times daily., Disp: 22 g, Rfl: 0   olmesartan -hydrochlorothiazide  (BENICAR  HCT) 40-12.5 MG tablet, TAKE ONE (1) TABLET BY MOUTH EVERY DAY, Disp: 90 tablet, Rfl: 1   Quercetin 500 MG CAPS, Take by mouth., Disp: , Rfl:    TRULICITY  3 MG/0.5ML SOAJ, INJECT 3 MG AS DIRECTED ONCE A WEEK, Disp: 2 mL, Rfl: 3   vitamin C (ASCORBIC ACID) 500 MG tablet, Take 500 mg by mouth daily  before lunch., Disp: , Rfl:    Zinc Sulfate (ZINC 15 PO), Take by mouth daily., Disp: , Rfl:    Medications ordered in this encounter:  Meds ordered this encounter  Medications   hydrOXYzine  (VISTARIL ) 25 MG capsule    Sig: Take 1 capsule (25 mg total) by mouth every 8 (eight) hours as needed.    Dispense:  30 capsule    Refill:  0    Supervising Provider:   BLAISE ALEENE KIDD [[id number]]     *If you need refills on other medications prior to your next appointment, please contact your pharmacy*  Follow-Up: Call back or seek an in-person evaluation if the symptoms worsen or if the condition fails to improve  as anticipated.  [facility] [phone]  Other Instructions Keep skin clean and dry. Wear loose-fitting clothing. Continue care as directed by the Dermatologist, starting the hydroxyzine  I have sent in. Follow-up with your specialist first thing Monday morning for further evaluation and management.    If you have been instructed to have an in-person evaluation today at a local Urgent Care facility, please use the link below. It will take you to a list of all of our available [facility], including address, phone number and hours of operation. Please do not delay care.  [facility]  If you or a family member do not have a primary care provider, use the link below to schedule a visit and establish care. When you choose a [facility] primary care physician or advanced practice provider, you gain a long-term partner in health. Find a Primary Care Provider  Learn more about [facility] in-office and virtual care options:  - Get Care Now

## 2023-10-20 ENCOUNTER — Ambulatory Visit: Admitting: Nurse Practitioner

## 2023-10-20 VITALS — BP 148/80 | HR 93 | Temp 98.3°F | Ht 73.0 in | Wt 220.4 lb

## 2023-10-20 DIAGNOSIS — N1832 Chronic kidney disease, stage 3b: Secondary | ICD-10-CM

## 2023-10-20 DIAGNOSIS — Z7985 Long-term (current) use of injectable non-insulin antidiabetic drugs: Secondary | ICD-10-CM

## 2023-10-20 DIAGNOSIS — E1165 Type 2 diabetes mellitus with hyperglycemia: Secondary | ICD-10-CM | POA: Diagnosis not present

## 2023-10-20 DIAGNOSIS — Z7984 Long term (current) use of oral hypoglycemic drugs: Secondary | ICD-10-CM

## 2023-10-20 DIAGNOSIS — I10 Essential (primary) hypertension: Secondary | ICD-10-CM

## 2023-10-20 DIAGNOSIS — E782 Mixed hyperlipidemia: Secondary | ICD-10-CM | POA: Diagnosis not present

## 2023-10-20 NOTE — Assessment & Plan Note (Signed)
 Hyperlipidemia Managed with atorvastatin  and fenofibrate . - Continue atorvastatin  40 mg daily. - Continue fenofibrate  67 mg daily.

## 2023-10-20 NOTE — Progress Notes (Signed)
 Established Patient Office Visit  Subjective   Patient ID: Karl Atkins, male    DOB: 30-Jan-1966  Age: 58 y.o. MRN: [id number]  Chief Complaint  Patient presents with   Follow-up    No concerns, just a check up    Discussed the use of AI scribe software for clinical note transcription with the patient, who gave verbal consent to proceed.  History of Present Illness Karl Atkins is a 58 year old male with hypertension and type 2 diabetes who presents for a follow-up visit.  Right flank pain - Persistent pain in the right kidney area - Multiple imaging studies have not identified a clear cause  Hypertension - Takes metoprolol  50 mg once daily and olmesartan -hydrochlorothiazide  40-12.5 mg once daily - Does not take antihypertensive medications at the same time every day - Did not take antihypertensive medications on the day of the visit - No dizziness, chest pain, or lightheadedness  Type 2 diabetes mellitus - Uses Dexcom sensor for glucose monitoring - Takes Jardiance  25 mg daily, Tresiba , metformin  500 mg twice daily, and Trulicity  3 mg weekly. Issues with medication affordability especially the trulicity  - Blood sugar control is generally good with proper diet - Recent high blood sugar readings attributed to prednisone  use - Dietary indiscretions during a recent camping trip - A1c not recently checked  Chronic kidney disease - Stage 3B chronic kidney disease - Has not seen nephrologist since November of the previous year due to a missed appointment  Peripheral neuropathy/peroneal nerve injury - Decreased sensation in the right foot - Describes right foot as often 'half numb all the damn time'  Hyperlipidemia - Takes atorvastatin  40 mg daily and fenofibrate  67 mg daily      ROS: see HPI    Objective:     BP (!) 148/80 (BP Location: Left Arm, Patient Position: Sitting)   Pulse 93   Temp 98.3 F (36.8 C) (Temporal)   Ht 6' 1 (1.854 m)   Wt 220 lb 6.4 oz  (100 kg)   SpO2 94%   BMI 29.08 kg/m  BP Readings from Last 3 Encounters:  10/20/23 (!) 148/80  06/16/23 (!) 140/86  05/04/23 130/82   Wt Readings from Last 3 Encounters:  10/20/23 220 lb 6.4 oz (100 kg)  06/16/23 222 lb 6 oz (100.9 kg)  05/04/23 225 lb 8 oz (102.3 kg)     Diabetic Foot Exam - Simple   Simple Foot Form Diabetic Foot exam was performed with the following findings: Yes 10/20/2023  2:27 PM  Visual Inspection No deformities, no ulcerations, no other skin breakdown bilaterally: Yes Sensation Testing See comments: Yes Pulse Check Posterior Tibialis and Dorsalis pulse intact bilaterally: Yes Comments R: 3/10 monofilament locations felt L: 9/10 monofilament locations felt      Physical Exam Vitals reviewed.  Constitutional:      Appearance: Normal appearance.  HENT:     Head: Normocephalic and atraumatic.  Cardiovascular:     Rate and Rhythm: Normal rate and regular rhythm.  Pulmonary:     Effort: Pulmonary effort is normal.     Breath sounds: Normal breath sounds.  Musculoskeletal:     Cervical back: Neck supple.  Skin:    General: Skin is warm and dry.  Neurological:     Mental Status: He is alert and oriented to person, place, and time.  Psychiatric:        Mood and Affect: Mood normal.        Behavior: Behavior  normal.        Thought Content: Thought content normal.        Judgment: Judgment normal.      No results found for any visits on 10/20/23.    The ASCVD Risk score (Arnett DK, et al., 2019) failed to calculate for the following reasons:   The valid total cholesterol range is 130 to 320 mg/dL    Assessment & Plan:   Problem List Items Addressed This Visit       Cardiovascular and Mediastinum   Hypertension   Blood pressure elevated at 148/80 mmHg. Inconsistent medication timing may contribute. - Continue metoprolol  50 mg daily. - Continue olmesartan -hydrochlorothiazide  40-12.5 mg daily. - Plan to check metabolic panel at next  office visit.        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 Dexcom sensor requires replacement. Chronic kidney disease stage 3B. Diabetic neuropathy in right foot with decreased sensation. - Continue Trulicity  3 mg weekly, Jardiance  10 mg daily, Metformin  500 mg twice daily. - Encourage follow-up with nephrology. - Perform diabetic foot exam today. - Plan to check A1c and other labs at next visit. - Continue using Dexcom sensor for glucose monitoring.      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 Dexcom sensor requires replacement. Chronic kidney disease stage 3B. Diabetic neuropathy in right foot with decreased sensation. - Continue Trulicity  3 mg weekly, Jardiance  10 mg daily, Metformin  500 mg twice daily. - Encourage follow-up with nephrology. - Perform diabetic foot exam today. - Plan to check A1c and other labs at next visit. - Continue using Dexcom sensor for glucose monitoring.        Other   Hyperlipidemia   Hyperlipidemia Managed with atorvastatin  and fenofibrate . - Continue atorvastatin  40 mg daily. - Continue fenofibrate  67 mg daily.      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 Dexcom sensor requires replacement. Chronic kidney disease stage 3B. Diabetic neuropathy in right foot with decreased sensation. - Continue Trulicity  3 mg weekly, Jardiance  10 mg daily,  Metformin  500 mg twice daily. - Encourage follow-up with nephrology. - Perform diabetic foot exam today. - Plan to check A1c and other labs at next visit. - Continue using Dexcom sensor for glucose monitoring.  Hypertension Blood pressure elevated at 148/80 mmHg. Inconsistent medication timing may contribute. - Continue metoprolol  50 mg daily. - Continue olmesartan -hydrochlorothiazide  40-12.5 mg daily. - Plan to check metabolic panel at next office visit.  Hyperlipidemia Managed with atorvastatin  and fenofibrate . - Continue atorvastatin  40 mg daily. - Continue fenofibrate  67 mg daily.   Return in about 3 months (around 01/19/2024) for CPE with Lauraine.    Lauraine FORBES Pereyra, NP

## 2023-10-20 NOTE — Assessment & Plan Note (Signed)
 Blood pressure elevated at 148/80 mmHg. Inconsistent medication timing may contribute. - Continue metoprolol  50 mg daily. - Continue olmesartan -hydrochlorothiazide  40-12.5 mg daily. - Plan to check metabolic panel at next office visit.

## 2023-10-20 NOTE — Assessment & Plan Note (Signed)
 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 Dexcom sensor requires replacement. Chronic kidney disease stage 3B. Diabetic neuropathy in right foot with decreased sensation. - Continue Trulicity  3 mg weekly, Jardiance  10 mg daily, Metformin  500 mg twice daily. - Encourage follow-up with nephrology. - Perform diabetic foot exam today. - Plan to check A1c and other labs at next visit. - Continue using Dexcom sensor for glucose monitoring.

## 2023-10-26 ENCOUNTER — Ambulatory Visit: Admitting: Nurse Practitioner

## 2023-10-31 ENCOUNTER — Other Ambulatory Visit: Payer: Self-pay | Admitting: Nurse Practitioner

## 2023-10-31 DIAGNOSIS — E1165 Type 2 diabetes mellitus with hyperglycemia: Secondary | ICD-10-CM

## 2023-10-31 DIAGNOSIS — E782 Mixed hyperlipidemia: Secondary | ICD-10-CM

## 2023-11-02 ENCOUNTER — Other Ambulatory Visit (HOSPITAL_BASED_OUTPATIENT_CLINIC_OR_DEPARTMENT_OTHER): Payer: Self-pay

## 2023-11-02 ENCOUNTER — Other Ambulatory Visit (INDEPENDENT_AMBULATORY_CARE_PROVIDER_SITE_OTHER): Admitting: Pharmacist

## 2023-11-02 ENCOUNTER — Other Ambulatory Visit: Payer: Self-pay | Admitting: Nurse Practitioner

## 2023-11-02 ENCOUNTER — Other Ambulatory Visit (HOSPITAL_COMMUNITY): Payer: Self-pay

## 2023-11-02 DIAGNOSIS — E1165 Type 2 diabetes mellitus with hyperglycemia: Secondary | ICD-10-CM

## 2023-11-02 DIAGNOSIS — Z7984 Long term (current) use of oral hypoglycemic drugs: Secondary | ICD-10-CM

## 2023-11-02 DIAGNOSIS — N1832 Chronic kidney disease, stage 3b: Secondary | ICD-10-CM

## 2023-11-02 DIAGNOSIS — Z794 Long term (current) use of insulin: Secondary | ICD-10-CM

## 2023-11-02 DIAGNOSIS — I10 Essential (primary) hypertension: Secondary | ICD-10-CM

## 2023-11-02 DIAGNOSIS — E559 Vitamin D deficiency, unspecified: Secondary | ICD-10-CM

## 2023-11-02 DIAGNOSIS — E782 Mixed hyperlipidemia: Secondary | ICD-10-CM

## 2023-11-02 DIAGNOSIS — R21 Rash and other nonspecific skin eruption: Secondary | ICD-10-CM

## 2023-11-02 MED ORDER — PREDNISONE 20 MG PO TABS: 40.0000 mg | ORAL_TABLET | Freq: Every day | ORAL | 0 refills | Status: DC

## 2023-11-02 MED ORDER — TRULICITY 1.5 MG/0.5ML ~~LOC~~ SOAJ
1.5000 mg | SUBCUTANEOUS | 0 refills | Status: DC
  Filled 2023-11-02 (×2): qty 2, 28d supply, fill #0

## 2023-11-02 NOTE — Progress Notes (Signed)
 Please call patient and let him know that I got his message about his rash.  I have sent in an additional 3 days worth of steroids.  He can take 2 tablets of prednisone  by mouth every morning with food.  He should avoid any NSAIDs (ibuprofen , Aleve , Goody powder, Motrin , Advil , etc. while taking prednisone ).  If his rash continues to worsen he should come in for a visit.  I have also placed orders for labs to be drawn in preparation for his upcoming visit in a couple of weeks.  Please encourage him to come to lab fasting when he is ready to have these drawn.  Please let me know if he has any questions.

## 2023-11-02 NOTE — Progress Notes (Signed)
 11/02/2023 Name: Karl Atkins MRN: [id number] DOB: 1965/06/02  Chief Complaint  Patient presents with   Diabetes   Medication Management   Medication Assistance    Karl Atkins is a 58 y.o. year old male who presented for a telephone visit.   They were referred to the pharmacist by their PCP for assistance in managing diabetes.   Subjective:  Care Team: Primary Care Provider: Elnor Lauraine BRAVO, NP ; Next Scheduled Visit: 5/1  Medication Access/Adherence  Current Pharmacy:  [facility] - [place], [place] - [address] [place] [address] Phone: [phone] Fax: [phone]  [facility] [id number] - [address], [place] - [address] AT [facility] [address]  [address] & [address] [place] [address] Phone: [phone] Fax: [phone]  [place] [facility] [address] [place] [address] Phone: [phone] Fax: [phone]   Patient reports affordability concerns with their medications: No  Patient reports access/transportation concerns to their pharmacy: Yes  Patient reports adherence concerns with their medications:  Yes    **Pt reports a rash on his sides and under his belly button. It was also on his back but not currently. He is very itchy, impacting his ability to sleep. He took prednisone  x3 days and the rash improved but now it has returned and is worsening. Cause of the rash is unknown. Pt asks for me to consult with PCP if prednisone  can be called in for him to treat the rash. He also mentions he plans to come in for labs prior to the 10/2 follow up if any labs may be needed for the rash specifically  Diabetes:  Current medications: , metformin  500 mg BID, Tresiba  28 units daily (started 04/24/23), Jardiance  10 mg daily (restarted 3/20) **Pt was taking Trulicity  however has been out for about 1 month due to price increasing significantly. They were previously paying around $350 after coupon  (~$500 copay with insurance alone) but this last time the price went up to ~$900 for 90 DS and the pharmacist was unable to determine why.  Current glucose readings:  Using Dexcom G7 sensors - connected to [facility] to see his readings   Current meal patterns: Pt has made diet changes - reduced sweets and high carb foods such as ramen noodles   Hyperlipidemia/ASCVD Risk Reduction  Current lipid lowering medications: fenofibrate  67 mg daily (just reduced due to renal function), atorvastatin  20 mg daily (started 3/12) Medications tried in the past: has not taken statin in the past   The ASCVD Risk score (Arnett DK, et al., 2019) failed to calculate for the following reasons:   The valid total cholesterol range is 130 to 320 mg/dL   Pt notes he has a history of very high triglycerides. Has also had ongoing abdominal pain that he has been evaluated for before but has not found any answers for cause of the pain. He notes he still has abdominal pain, it is slightly improved.  Objective:  Lab Results  Component Value Date   HGBA1C 13.2 04/13/2023    Lab Results  Component Value Date   CREATININE 1.80 (H) 06/13/2023   BUN 25 (H) 06/13/2023   NA 139 06/13/2023   K 3.9 06/13/2023   CL 104 06/13/2023   CO2 29 06/13/2023    Lab Results  Component Value Date   CHOL 117 06/13/2023   HDL 38.40 (L) 06/13/2023   LDLCALC 53 06/13/2023  LDLDIRECT 147.0 12/29/2022   TRIG 129.0 06/13/2023   CHOLHDL 3 06/13/2023    Medications Reviewed Today     Reviewed by Merceda Lela SAUNDERS, RPH (Pharmacist) on 11/02/23 at 1529  Med List Status: <None>   Medication Order Taking? Sig Documenting Provider Last Dose Status Informant  atorvastatin  (LIPITOR) 40 MG tablet [id number]  Take 1 tablet (40 mg total) by mouth daily. Elnor Lauraine BRAVO, NP  Active   cetirizine (ZYRTEC) 5 MG tablet [id number]  Take 5 mg by mouth daily. [provider]  Active   Cholecalciferol (VITAMIN D3) 125 MCG (5000 UT)  CAPS [id number]  Take 2 capsules by mouth daily. [provider]  Active            Med Note RANDI, MINDY S   Wed Apr 29, 2020 11:06 AM)    clotrimazole  (LOTRIMIN ) 1 % cream [id number]  Apply 1 Application topically 2 (two) times daily. Elnor Lauraine BRAVO, NP  Active   Continuous Glucose Sensor (DEXCOM G7 SENSOR) MISC [id number]  APPLY SENSOR TO SKIN OF UPPER ARM ONCE EVERY 10 DAYS AS INSTRUCTED IN MANUAL. MAKE SURE TO CHANGE SENSOR EVERY 10 DAYS. Webb, Padonda B, FNP  Active   doxycycline  (VIBRA -TABS) 100 MG tablet [id number]  Take 1 tablet (100 mg total) by mouth 2 (two) times daily. Elnor Lauraine BRAVO, NP  Active   empagliflozin  (JARDIANCE ) 25 MG TABS tablet [id number] Yes Take 1 tablet (25 mg total) by mouth daily before breakfast. Elnor Lauraine BRAVO, NP  Active   fenofibrate  micronized (LOFIBRA) 67 MG capsule [id number]  TAKE ONE CAPSULE (67MG  TOTAL) BY MOUTH DAILY BEFORE OFILIA Elnor Lauraine BRAVO, NP  Active   gabapentin  (NEURONTIN ) 300 MG capsule [id number]  Take 1 capsule (300 mg total) by mouth 3 (three) times daily. Elnor Lauraine BRAVO, NP  Active   HYDROcodone  bit-homatropine [facility]) 5-1.5 MG/5ML syrup [id number]  Take 5 mLs by mouth at bedtime as needed for cough. Elnor Lauraine BRAVO, NP  Active   hydrOXYzine  (VISTARIL ) 25 MG capsule [id number]  Take 1 capsule (25 mg total) by mouth every 8 (eight) hours as needed. Gladis Elsie BROCKS, PA-C  Active   Insulin  Pen Needle 31G X 5 MM MISC [id number]  1 each by Does not apply route daily. Elnor Lauraine BRAVO, NP  Active   metFORMIN  (GLUCOPHAGE ) 500 MG tablet [id number] Yes TAKE ONE TABLET (500 MG TOTAL) BY MOUTH TWO TIMES DAILY WITH A MEAL. Webb, Padonda B, FNP  Active   metoprolol  succinate (TOPROL -XL) 50 MG 24 hr tablet [id number]  Take 1 tablet (50 mg total) by mouth daily. Elnor Lauraine BRAVO, NP  Active   Multiple Vitamin (MULTIVITAMIN WITH MINERALS) TABS tablet [id number]  Take 1 tablet by mouth daily before lunch. [provider]  Active Spouse/Significant Other   mupirocin  ointment (BACTROBAN ) 2 % [id number]  Apply 1 Application topically 2 (two) times daily. Elnor Lauraine BRAVO, NP  Active   olmesartan -hydrochlorothiazide  (BENICAR  HCT) 40-12.5 MG tablet [id number]  TAKE ONE (1) TABLET BY MOUTH EVERY DAY Webb, Padonda B, FNP  Active   Quercetin 500 MG CAPS [id number]  Take by mouth. [provider]  Active   TRESIBA  FLEXTOUCH 100 UNIT/ML FlexTouch Pen [id number] Yes INJECT UP TO 50 UNITS UNDER THE SKIN DAILY. START WITH 25 UNITS DAILY, INCREASE BY 2 UNITS EVERY 3 DAYS UNTIL FASTING BLOOD SUGARS AVERAGING 130-150 Elnor Lauraine BRAVO, NP  Active   TRULICITY  3 MG/0.5ML EMMANUEL [id number]  INJECT 3 MG AS DIRECTED ONCE A WEEK  Patient not taking: Reported on 11/02/2023   Webb, Padonda B, FNP  Active   vitamin C (ASCORBIC ACID) 500 MG tablet [id number]  Take 500 mg by mouth daily before lunch. [provider]  Active Spouse/Significant Other  Zinc Sulfate (ZINC 15 PO) [id number]  Take by mouth daily. [provider]  Active               Assessment/Plan:   Diabetes: - Currently uncontrolled, A1c goal <7% -   BG have been higher lately due to prednisone  use and no longer on Trulicity  3 mg. GMI was previously around 7% on his Dexcom. - Extensively reviewed long term cardiovascular and renal outcomes of uncontrolled blood sugar - Reviewed goal A1c, goal fasting, and goal 2 hour post prandial glucose - Reviewed dietary modifications including increasing protein, carb heavy foods in moderation and with balanced meal/snack - Reviewed lifestyle modifications including: increased activity/exercise as able - Renal function improved on last check in March, monitor for GFR <30 - Recommend to increase Tresiba  to 35 units daily while off Trulicity  - Will try sending Trulicity  1.5 mg to [facility] - test claim was coming through as $25. Needs to reduce dose since he has been off Trulicity  about a month. - Due for A1c   Hyperlipidemia/ASCVD Risk  Reduction: - Currently uncontrolled. LDL goal <70 per ADA, TG goal <150 - Reviewed long term complications of uncontrolled cholesterol - Recommend to continue atorvastatin  40 mg daily, continue fenofibrate  67 mg daily  - Due for lipid panel  Follow Up Plan: 10/9   Darrelyn Drum, PharmD, BCPS, CPP Clinical Pharmacist Practitioner [facility] [phone]

## 2023-11-16 ENCOUNTER — Ambulatory Visit: Admitting: Nurse Practitioner

## 2023-11-16 VITALS — BP 142/86 | HR 88 | Temp 98.0°F | Ht 73.0 in | Wt 218.4 lb

## 2023-11-16 DIAGNOSIS — R21 Rash and other nonspecific skin eruption: Secondary | ICD-10-CM

## 2023-11-16 DIAGNOSIS — I10 Essential (primary) hypertension: Secondary | ICD-10-CM | POA: Diagnosis not present

## 2023-11-16 DIAGNOSIS — E782 Mixed hyperlipidemia: Secondary | ICD-10-CM | POA: Diagnosis not present

## 2023-11-16 DIAGNOSIS — E1165 Type 2 diabetes mellitus with hyperglycemia: Secondary | ICD-10-CM | POA: Diagnosis not present

## 2023-11-16 MED ORDER — PREDNISONE 20 MG PO TABS
40.0000 mg | ORAL_TABLET | Freq: Every day | ORAL | 0 refills | Status: AC
Start: 2023-11-16 — End: ?

## 2023-11-16 NOTE — Assessment & Plan Note (Signed)
 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 Last A1c was 13.2. On Trulicity , Jardiance , metformin , and Tresiba . Awaiting fasting labs to assess status and adjust treatment.  - Instruct to return for fasting labs within the next week. - Plan to adjust diabetes medications based on lab results, potentially increasing Trulicity  if A1c remains high.

## 2023-11-16 NOTE — Assessment & Plan Note (Signed)
 Recurrent pruritic rash of unclear etiology Recurrent pruritic rash around the belt area, resolved with prednisone . No definitive diagnosis. Differential includes mycosis fungoides.  - Request records from previous dermatologist, Dr. Shona. - Refer to a different dermatologist for second opinion and potential biopsy. - Advise to avoid prednisone  before dermatology evaluation if rash recurs.

## 2023-11-16 NOTE — Assessment & Plan Note (Signed)
 Hyperlipidemia associated with type 2 diabetes Hyperlipidemia associated with type 2 diabetes, managed with atorvastatin  and fenofibrate . Last LDL was 53. Awaiting lab results to assess current lipid control. - Await lab results to determine if any adjustments to hyperlipidemia management are needed.

## 2023-11-16 NOTE — Assessment & Plan Note (Signed)
  Hypertension Hypertension managed with metoprolol  and olmesartan -hydrochlorothiazide . Continue as prescribed.

## 2023-11-16 NOTE — Progress Notes (Signed)
 Established Patient Office Visit  Subjective   Patient ID: Karl Atkins, male    DOB: [birth year]  Age: 58 y.o. MRN: [id number]  Chief Complaint  Patient presents with   Diabetes    Discussed the use of AI scribe software for clinical note transcription with the patient, who gave verbal consent to proceed.  History of Present Illness Karl Atkins is a 58 year old male with diabetes and chronic kidney disease who presents with a recurrent pruritic rash around his belt area.   Recurrent pruritic dermatitis - Recurrent pruritic rash localized around the belt area - Rash previously severe, described as 'beet red' and intensely itchy - Symptoms alleviated by prednisone  and a topical cream provided by dermatology - No definitive diagnosis established for the rash - Concern about recurrence and lack of prednisone  for future flares  Diabetes mellitus - Diabetes managed with Trulicity  1.5 mg subcutaneous injection weekly, Jardiance  25 mg daily, metformin  500 mg twice a day with meals, and Tresiba  34 units daily - Last hemoglobin A1c was 13.2 - Plans to complete fasting labs within the next week  Chronic kidney disease - Stage 3B chronic kidney disease - Recent GFR of 40.97 - Albuminuria with last urine albumin measurement of 160.4 - Continues on Jardiance  and olmesartan -hydrochlorothiazide  40-12.5 mg daily  Hyperlipidemia - Last LDL was 53 - On atorvastatin  40 mg daily and fenofibrate  67 mg daily  Hypertension - On metoprolol  50 mg daily and olmesartan -hydrochlorothiazide  40-12.5 mg daily      ROS: see HPI    Objective:     BP (!) 142/86   Pulse 88   Temp 98 F (36.7 C) (Temporal)   Ht 6' 1 (1.854 m)   Wt 218 lb 6 oz (99.1 kg)   SpO2 97%   BMI 28.81 kg/m  BP Readings from Last 3 Encounters:  11/16/23 (!) 142/86  10/20/23 (!) 148/80  06/16/23 (!) 140/86   Wt Readings from Last 3 Encounters:  11/16/23 218 lb 6 oz (99.1 kg)  10/20/23 220 lb 6.4 oz (100 kg)   06/16/23 222 lb 6 oz (100.9 kg)      Physical Exam Vitals reviewed.  Constitutional:      Appearance: Normal appearance.  HENT:     Head: Normocephalic and atraumatic.  Cardiovascular:     Rate and Rhythm: Normal rate and regular rhythm.  Pulmonary:     Effort: Pulmonary effort is normal.     Breath sounds: Normal breath sounds.  Musculoskeletal:     Cervical back: Neck supple.  Skin:    General: Skin is warm and dry.  Neurological:     Mental Status: He is alert and oriented to person, place, and time.  Psychiatric:        Mood and Affect: Mood normal.        Behavior: Behavior normal.        Thought Content: Thought content normal.        Judgment: Judgment normal.      No results found for any visits on 11/16/23.    The ASCVD Risk score (Arnett DK, et al., 2019) failed to calculate for the following reasons:   The valid total cholesterol range is 130 to 320 mg/dL    Assessment & Plan:   Problem List Items Addressed This Visit       Cardiovascular and Mediastinum   Hypertension - Primary    Hypertension Hypertension managed with metoprolol  and olmesartan -hydrochlorothiazide . Continue as prescribed.  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 Last A1c was 13.2. On Trulicity , Jardiance , metformin , and Tresiba . Awaiting fasting labs to assess status and adjust treatment.  - Instruct to return for fasting labs within the next week. - Plan to adjust diabetes medications based on lab results, potentially increasing Trulicity  if A1c remains high.        Musculoskeletal and Integument   Rash   Recurrent pruritic rash of unclear etiology Recurrent pruritic rash around the belt area, resolved with prednisone . No definitive diagnosis. Differential includes mycosis fungoides.  - Request  records from previous dermatologist, Dr. Shona. - Refer to a different dermatologist for second opinion and potential biopsy. - Advise to avoid prednisone  before dermatology evaluation if rash recurs.      Relevant Medications   predniSONE  (DELTASONE ) 20 MG tablet   Other Relevant Orders   Ambulatory referral to Dermatology     Other   Hyperlipidemia   Hyperlipidemia associated with type 2 diabetes Hyperlipidemia associated with type 2 diabetes, managed with atorvastatin  and fenofibrate . Last LDL was 53. Awaiting lab results to assess current lipid control. - Await lab results to determine if any adjustments to hyperlipidemia management are needed.     Assessment and Plan Assessment & Plan Recurrent pruritic rash of unclear etiology Recurrent pruritic rash around the belt area, resolved with prednisone . No definitive diagnosis. Differential includes mycosis fungoides.  - Request records from previous dermatologist, Dr. Shona. - Refer to a different dermatologist for second opinion and potential biopsy. - Advise to avoid prednisone  before dermatology evaluation if rash recurs.  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 Last A1c was 13.2. On Trulicity , Jardiance , metformin , and Tresiba . Awaiting fasting labs to assess status and adjust treatment.  - Instruct to return for fasting labs within the next week. - Plan to adjust diabetes medications based on lab results, potentially increasing Trulicity  if A1c remains high.  Hypertension Hypertension managed with metoprolol  and olmesartan -hydrochlorothiazide . Continue as prescribed.  Hyperlipidemia associated with type 2 diabetes Hyperlipidemia associated with type 2 diabetes, managed with atorvastatin  and fenofibrate . Last LDL was 53. Awaiting lab results to assess current lipid control. - Await lab results to determine if any  adjustments to hyperlipidemia management are needed.    Return in about 3 months (around 02/16/2024) for F/U with Wilba Mutz.    Lauraine FORBES Pereyra, NP

## 2023-11-23 ENCOUNTER — Other Ambulatory Visit (HOSPITAL_BASED_OUTPATIENT_CLINIC_OR_DEPARTMENT_OTHER): Payer: Self-pay

## 2023-11-23 ENCOUNTER — Other Ambulatory Visit (INDEPENDENT_AMBULATORY_CARE_PROVIDER_SITE_OTHER): Admitting: Pharmacist

## 2023-11-23 DIAGNOSIS — E1165 Type 2 diabetes mellitus with hyperglycemia: Secondary | ICD-10-CM

## 2023-11-23 MED ORDER — TRULICITY 3 MG/0.5ML ~~LOC~~ SOAJ
3.0000 mg | SUBCUTANEOUS | 2 refills | Status: DC
  Filled 2023-11-23: qty 2, 28d supply, fill #0
  Filled 2024-01-01: qty 2, 28d supply, fill #1
  Filled 2024-02-02: qty 2, 28d supply, fill #2

## 2023-11-23 NOTE — Progress Notes (Signed)
 11/23/2023 Name: Karl Atkins MRN: [id number] DOB: 1965/05/16  Chief Complaint  Patient presents with   Diabetes   Medication Management    Karl Atkins is a 58 y.o. year old male who presented for a telephone visit.   They were referred to the pharmacist by their PCP for assistance in managing diabetes.   Subjective:  Care Team: Primary Care Provider: Elnor Lauraine BRAVO, NP ; Next Scheduled Visit: 5/1  Medication Access/Adherence  Current Pharmacy:  [facility] [place] [place] - [address] [place] [address] Phone: [phone] Fax: [phone]  [facility] [id number] - [address] [place] - [address] AT [facility] [address] [place] [address] Phone: [phone] Fax: [phone]  [place] [facility] [address] [place] [address] Phone: [phone] Fax: [phone]   Patient reports affordability concerns with their medications: No  Patient reports access/transportation concerns to their pharmacy: Yes  Patient reports adherence concerns with their medications:  Yes    Diabetes:  Current medications: , metformin  500 mg BID, Tresiba  28 units daily (started 04/24/23), Jardiance  10 mg daily (restarted 3/20), Trulicity  1.5 mg weekly (restarted 11/03/23)  Denies issues w/ side effects from Trulicity  since restarting  Current glucose readings:  Using Dexcom G7 sensors - connected to [facility] to see his readings   Current meal patterns: Pt has made diet changes - reduced sweets and high carb foods such as ramen noodles   Hyperlipidemia/ASCVD Risk Reduction  Current lipid lowering medications: fenofibrate  67 mg daily (just reduced due to renal function), atorvastatin  20 mg daily (started 3/12) Medications tried in the past: has not taken statin in the past   The ASCVD Risk score (Arnett DK, et al., 2019) failed to calculate for the following reasons:   The valid  total cholesterol range is 130 to 320 mg/dL   Pt notes he has a history of very high triglycerides. Has also had ongoing abdominal pain that he has been evaluated for before but has not found any answers for cause of the pain. He notes he still has abdominal pain, it is slightly improved.  Objective:  Lab Results  Component Value Date   HGBA1C 13.2 04/13/2023    Lab Results  Component Value Date   CREATININE 1.80 (H) 06/13/2023   BUN 25 (H) 06/13/2023   NA 139 06/13/2023   K 3.9 06/13/2023   CL 104 06/13/2023   CO2 29 06/13/2023    Lab Results  Component Value Date   CHOL 117 06/13/2023   HDL 38.40 (L) 06/13/2023   LDLCALC 53 06/13/2023   LDLDIRECT 147.0 12/29/2022   TRIG 129.0 06/13/2023   CHOLHDL 3 06/13/2023    Medications Reviewed Today     Reviewed by Karl Atkins, RPH (Pharmacist) on 11/23/23 at 1620  Med List Status: <None>   Medication Order Taking? Sig Documenting Provider Last Dose Status Informant  atorvastatin  (LIPITOR) 40 MG tablet [id number]  Take 1 tablet (40 mg total) by mouth daily. Karl Lauraine BRAVO, NP  Active   cetirizine (ZYRTEC) 5 MG tablet [id number]  Take 5 mg by mouth daily. [provider]  Active   Cholecalciferol (VITAMIN D3) 125 MCG (5000 UT) CAPS [id number]  Take 2 capsules by mouth daily. [provider]  Active            Med Note Karl Atkins   Wed Apr 29, 2020 11:06 AM)    clotrimazole  (LOTRIMIN ) 1 % cream [id number]  Apply 1 Application topically 2 (two) times daily. Karl Lauraine BRAVO, NP  Active   Continuous Glucose Sensor (DEXCOM G7 SENSOR) MISC [id number]  APPLY SENSOR TO SKIN OF UPPER ARM ONCE EVERY 10 DAYS AS INSTRUCTED IN MANUAL. MAKE SURE TO CHANGE SENSOR EVERY 10 DAYS. Webb, Padonda B, FNP  Active   Dulaglutide  (TRULICITY ) 1.5 MG/0.5ML Karl Atkins [id number] Yes Inject 1.5 mg into the skin once a week. Karl Lauraine BRAVO, NP  Active   empagliflozin  (JARDIANCE ) 25 MG TABS tablet [id number] Yes Take 1 tablet (25 mg total) by  mouth daily before breakfast. Karl Lauraine BRAVO, NP  Active   fenofibrate  micronized (LOFIBRA) 67 MG capsule [id number]  TAKE ONE CAPSULE (67MG  TOTAL) BY MOUTH DAILY BEFORE Karl Atkins Karl Lauraine BRAVO, NP  Active   gabapentin  (NEURONTIN ) 300 MG capsule [id number]  Take 1 capsule (300 mg total) by mouth 3 (three) times daily. Karl Lauraine BRAVO, NP  Active   HYDROcodone  bit-homatropine [facility]) 5-1.5 MG/5ML syrup [id number]  Take 5 mLs by mouth at bedtime as needed for cough. Karl Lauraine BRAVO, NP  Active   hydrOXYzine  (VISTARIL ) 25 MG capsule [id number]  Take 1 capsule (25 mg total) by mouth every 8 (eight) hours as needed. Karl Elsie BROCKS, PA-C  Active   Insulin  Pen Needle 31G X 5 MM MISC [id number]  1 each by Does not apply route daily. Karl Lauraine BRAVO, NP  Active   metFORMIN  (GLUCOPHAGE ) 500 MG tablet [id number] Yes TAKE ONE TABLET (500 MG TOTAL) BY MOUTH TWO TIMES DAILY WITH A MEAL. Webb, Padonda B, FNP  Active   metoprolol  succinate (TOPROL -XL) 50 MG 24 hr tablet [id number]  Take 1 tablet (50 mg total) by mouth daily. Karl Lauraine BRAVO, NP  Active   Multiple Vitamin (MULTIVITAMIN WITH MINERALS) TABS tablet [id number]  Take 1 tablet by mouth daily before lunch. [provider]  Active Spouse/Significant Other  mupirocin  ointment (BACTROBAN ) 2 % [id number]  Apply 1 Application topically 2 (two) times daily. Karl Lauraine BRAVO, NP  Active   olmesartan -hydrochlorothiazide  (BENICAR  HCT) 40-12.5 MG tablet [id number]  TAKE ONE (1) TABLET BY MOUTH EVERY DAY Webb, Padonda B, FNP  Active   predniSONE  (DELTASONE ) 20 MG tablet [id number]  Take 2 tablets (40 mg total) by mouth daily with breakfast. Karl Lauraine BRAVO, NP  Active   Quercetin 500 MG CAPS [id number]  Take by mouth. [provider]  Active   TRESIBA  FLEXTOUCH 100 UNIT/ML FlexTouch Pen [id number] Yes INJECT UP TO 50 UNITS UNDER THE SKIN DAILY. START WITH 25 UNITS DAILY, INCREASE BY 2 UNITS EVERY 3 DAYS UNTIL FASTING BLOOD SUGARS AVERAGING 130-150 Karl Lauraine BRAVO, NP  Active    vitamin C (ASCORBIC ACID) 500 MG tablet [id number]  Take 500 mg by mouth daily before lunch. [provider]  Active Spouse/Significant Other  Zinc Sulfate (ZINC 15 PO) [id number]  Take by mouth daily. [provider]  Active               Assessment/Plan:   Diabetes: - Currently uncontrolled, A1c goal <7% -   BG have improved since restarting trulicity  with 2 week GMI improving from 8.8% to 7.7% - Extensively reviewed long term cardiovascular and renal outcomes of uncontrolled blood sugar - Reviewed goal A1c, goal fasting, and goal 2 hour post prandial glucose - Reviewed dietary modifications including increasing protein, carb heavy foods in moderation and with balanced meal/snack - Reviewed lifestyle modifications including:  increased activity/exercise as able - Renal function improved on last check in March, monitor for GFR <30 - Increase Trulicity  to 3 mg weekly after completing 4 weeks of 1.5 mg - Due for A1c   Hyperlipidemia/ASCVD Risk Reduction: - Currently uncontrolled. LDL goal <70 per ADA, TG goal <150 - Reviewed long term complications of uncontrolled cholesterol - Recommend to continue atorvastatin  40 mg daily, continue fenofibrate  67 mg daily  - Due for lipid panel  Follow Up Plan: Pt to come in the next 1-2 weeks for fasting labs   Darrelyn Drum, PharmD, BCPS, CPP Clinical Pharmacist Practitioner  Primary Care at [facility] [phone]

## 2023-11-23 NOTE — Patient Instructions (Signed)
 It was a pleasure speaking with you today!  Complete Trulicity  1.5 mg weekly for 4 weeks then increase to 3 mg. I have sent the prescription to [facility].  Feel free to call with any questions or concerns!  Darrelyn Drum, PharmD, BCPS, CPP Clinical Pharmacist Practitioner [place] [facility] at [facility] [phone]

## 2023-12-04 ENCOUNTER — Other Ambulatory Visit (HOSPITAL_BASED_OUTPATIENT_CLINIC_OR_DEPARTMENT_OTHER): Payer: Self-pay

## 2023-12-22 ENCOUNTER — Telehealth: Payer: Self-pay | Admitting: Pharmacist

## 2023-12-22 NOTE — Telephone Encounter (Addendum)
 Pharmacy Quality Measure Review  This patient is appearing on a report for being at risk of failing the Glycemic Status Assessment in Diabetes and Controlling Blood Pressure measure this calendar year.   Last documented BP 142/86 on 11/16/23  Last documented A1c or GMI 13.2 on 04/13/23   Per last telephone call, pt was supposed to come by for labs about a month ago. Next PCP appt scheduled for January. Called pt to schedule BP check and labs - he notes he will come by Monday for labs and for BP check.   Darrelyn Drum, PharmD, BCPS, CPP Clinical Pharmacist Practitioner [place] [facility] at [facility] [phone]

## 2023-12-25 ENCOUNTER — Encounter: Payer: Self-pay | Admitting: Nurse Practitioner

## 2024-01-01 ENCOUNTER — Other Ambulatory Visit (HOSPITAL_BASED_OUTPATIENT_CLINIC_OR_DEPARTMENT_OTHER): Payer: Self-pay

## 2024-01-12 ENCOUNTER — Other Ambulatory Visit: Payer: Self-pay | Admitting: Family

## 2024-01-12 ENCOUNTER — Other Ambulatory Visit: Payer: Self-pay | Admitting: Nurse Practitioner

## 2024-01-12 DIAGNOSIS — E1165 Type 2 diabetes mellitus with hyperglycemia: Secondary | ICD-10-CM

## 2024-02-06 DIAGNOSIS — E1165 Type 2 diabetes mellitus with hyperglycemia: Secondary | ICD-10-CM

## 2024-02-29 ENCOUNTER — Encounter: Payer: Self-pay | Admitting: Nurse Practitioner

## 2024-03-06 ENCOUNTER — Other Ambulatory Visit: Payer: Self-pay | Admitting: Nurse Practitioner

## 2024-03-06 DIAGNOSIS — B0229 Other postherpetic nervous system involvement: Secondary | ICD-10-CM

## 2024-03-08 ENCOUNTER — Other Ambulatory Visit (HOSPITAL_BASED_OUTPATIENT_CLINIC_OR_DEPARTMENT_OTHER): Payer: Self-pay

## 2024-03-08 ENCOUNTER — Other Ambulatory Visit: Payer: Self-pay | Admitting: Nurse Practitioner

## 2024-03-08 ENCOUNTER — Telehealth (HOSPITAL_BASED_OUTPATIENT_CLINIC_OR_DEPARTMENT_OTHER): Payer: Self-pay

## 2024-03-08 DIAGNOSIS — E1165 Type 2 diabetes mellitus with hyperglycemia: Secondary | ICD-10-CM

## 2024-03-08 MED ORDER — TRULICITY 3 MG/0.5ML ~~LOC~~ SOAJ
3.0000 mg | SUBCUTANEOUS | 2 refills | Status: AC
  Filled 2024-03-08: qty 2, 28d supply, fill #0

## 2024-03-08 NOTE — Telephone Encounter (Signed)
 Where is this request coming from? [facility] Medication: Trulicity  3 mg Prior authorization required? Yes If YES, on primary or secondary insurance? Primary Comments:

## 2024-03-08 NOTE — Telephone Encounter (Signed)
 Pharmacy Patient Advocate Encounter   Received notification from Pt Calls Messages that prior authorization for Trulicity  3MG /0.5ML auto-injectors  is required/requested.   Insurance verification completed.   The patient is insured through [facility].   Per test claim: PA required; PA submitted to above mentioned insurance via Latent Key/confirmation #/EOC [facility] Status is pending

## 2024-03-08 NOTE — Telephone Encounter (Signed)
 PA request has been Received. New Encounter has been or will be created for follow up. For additional info see Pharmacy Prior Auth telephone encounter from 03/08/24.

## 2024-03-11 ENCOUNTER — Other Ambulatory Visit (HOSPITAL_COMMUNITY): Payer: Self-pay

## 2024-03-11 ENCOUNTER — Other Ambulatory Visit (HOSPITAL_BASED_OUTPATIENT_CLINIC_OR_DEPARTMENT_OTHER): Payer: Self-pay

## 2024-03-11 NOTE — Telephone Encounter (Signed)
 Pharmacy Patient Advocate Encounter  Received notification from [facility] that Prior Authorization for  Trulicity  3MG /0.5ML auto-injectors  has been APPROVED from 03/08/24 to 03/08/25. Ran test claim, Copay is $25. This test claim was processed through [facility]- copay amounts may vary at other pharmacies due to pharmacy/plan contracts, or as the patient moves through the different stages of their insurance plan.   PA #/Case ID/Reference #: [id number]

## 2024-03-13 ENCOUNTER — Other Ambulatory Visit (HOSPITAL_BASED_OUTPATIENT_CLINIC_OR_DEPARTMENT_OTHER): Payer: Self-pay

## 2024-05-16 ENCOUNTER — Encounter: Payer: Self-pay | Admitting: Nurse Practitioner
